# Patient Record
Sex: Male | Born: 1960
Health system: Southern US, Community
[De-identification: ages and names within clinical notes are randomized; demographics above are authoritative.]

## PROBLEM LIST (undated history)

## (undated) DIAGNOSIS — IMO0002 Reserved for concepts with insufficient information to code with codable children: Secondary | ICD-10-CM

## (undated) DIAGNOSIS — H269 Unspecified cataract: Secondary | ICD-10-CM

## (undated) DIAGNOSIS — I10 Essential (primary) hypertension: Secondary | ICD-10-CM

## (undated) DIAGNOSIS — E785 Hyperlipidemia, unspecified: Secondary | ICD-10-CM

## (undated) DIAGNOSIS — N189 Chronic kidney disease, unspecified: Secondary | ICD-10-CM

## (undated) DIAGNOSIS — Q600 Renal agenesis, unilateral: Secondary | ICD-10-CM

## (undated) DIAGNOSIS — I509 Heart failure, unspecified: Secondary | ICD-10-CM

## (undated) DIAGNOSIS — Z5189 Encounter for other specified aftercare: Secondary | ICD-10-CM

## (undated) HISTORY — DX: Chronic kidney disease, unspecified: N18.9

## (undated) HISTORY — DX: Encounter for other specified aftercare: Z51.89

## (undated) HISTORY — DX: Renal agenesis, unilateral: Q60.0

## (undated) HISTORY — PX: KIDNEY SURGERY: SHX687

## (undated) HISTORY — PX: ABDOMINAL SURGERY: SHX537

## (undated) HISTORY — DX: Reserved for concepts with insufficient information to code with codable children: IMO0002

## (undated) HISTORY — DX: Unspecified cataract: H26.9

---

## 2003-07-06 ENCOUNTER — Emergency Department (HOSPITAL_COMMUNITY): Admission: EM | Admit: 2003-07-06 | Discharge: 2003-07-06 | Payer: Self-pay | Admitting: Emergency Medicine

## 2004-03-20 ENCOUNTER — Emergency Department (HOSPITAL_COMMUNITY): Admission: EM | Admit: 2004-03-20 | Discharge: 2004-03-20 | Payer: Self-pay | Admitting: Emergency Medicine

## 2004-03-31 ENCOUNTER — Encounter: Admission: RE | Admit: 2004-03-31 | Discharge: 2004-04-16 | Payer: Self-pay | Admitting: Emergency Medicine

## 2004-04-08 ENCOUNTER — Emergency Department (HOSPITAL_COMMUNITY): Admission: EM | Admit: 2004-04-08 | Discharge: 2004-04-08 | Payer: Self-pay | Admitting: Emergency Medicine

## 2004-04-23 ENCOUNTER — Ambulatory Visit: Payer: Self-pay | Admitting: Internal Medicine

## 2004-05-05 ENCOUNTER — Ambulatory Visit: Payer: Self-pay | Admitting: Internal Medicine

## 2004-05-07 ENCOUNTER — Ambulatory Visit: Payer: Self-pay | Admitting: Internal Medicine

## 2004-05-21 ENCOUNTER — Encounter: Admission: RE | Admit: 2004-05-21 | Discharge: 2004-08-19 | Payer: Self-pay | Admitting: Emergency Medicine

## 2004-05-27 ENCOUNTER — Ambulatory Visit: Payer: Self-pay | Admitting: Internal Medicine

## 2004-07-02 ENCOUNTER — Emergency Department (HOSPITAL_COMMUNITY): Admission: EM | Admit: 2004-07-02 | Discharge: 2004-07-02 | Payer: Self-pay | Admitting: Family Medicine

## 2004-08-08 ENCOUNTER — Ambulatory Visit: Payer: Self-pay | Admitting: Internal Medicine

## 2004-08-21 ENCOUNTER — Encounter: Admission: RE | Admit: 2004-08-21 | Discharge: 2004-11-19 | Payer: Self-pay | Admitting: Emergency Medicine

## 2004-10-17 ENCOUNTER — Encounter (INDEPENDENT_AMBULATORY_CARE_PROVIDER_SITE_OTHER): Payer: Self-pay | Admitting: *Deleted

## 2005-02-17 ENCOUNTER — Ambulatory Visit: Payer: Self-pay | Admitting: Internal Medicine

## 2005-03-26 ENCOUNTER — Ambulatory Visit: Payer: Self-pay | Admitting: Internal Medicine

## 2005-04-23 ENCOUNTER — Ambulatory Visit: Payer: Self-pay | Admitting: Internal Medicine

## 2005-05-19 ENCOUNTER — Encounter (INDEPENDENT_AMBULATORY_CARE_PROVIDER_SITE_OTHER): Payer: Self-pay | Admitting: *Deleted

## 2005-05-19 ENCOUNTER — Ambulatory Visit: Payer: Self-pay | Admitting: Hospitalist

## 2005-08-18 ENCOUNTER — Ambulatory Visit: Payer: Self-pay | Admitting: Internal Medicine

## 2005-09-15 ENCOUNTER — Ambulatory Visit: Payer: Self-pay | Admitting: Internal Medicine

## 2005-10-06 ENCOUNTER — Ambulatory Visit: Payer: Self-pay | Admitting: Internal Medicine

## 2005-11-06 ENCOUNTER — Emergency Department (HOSPITAL_COMMUNITY): Admission: EM | Admit: 2005-11-06 | Discharge: 2005-11-06 | Payer: Self-pay | Admitting: Family Medicine

## 2006-02-17 ENCOUNTER — Encounter (INDEPENDENT_AMBULATORY_CARE_PROVIDER_SITE_OTHER): Payer: Self-pay | Admitting: *Deleted

## 2006-02-17 DIAGNOSIS — E119 Type 2 diabetes mellitus without complications: Secondary | ICD-10-CM

## 2006-02-17 DIAGNOSIS — J301 Allergic rhinitis due to pollen: Secondary | ICD-10-CM | POA: Insufficient documentation

## 2006-02-17 DIAGNOSIS — L910 Hypertrophic scar: Secondary | ICD-10-CM | POA: Insufficient documentation

## 2006-02-17 DIAGNOSIS — E785 Hyperlipidemia, unspecified: Secondary | ICD-10-CM | POA: Insufficient documentation

## 2006-02-17 DIAGNOSIS — K565 Intestinal adhesions [bands], unspecified as to partial versus complete obstruction: Secondary | ICD-10-CM | POA: Insufficient documentation

## 2006-02-17 DIAGNOSIS — Z905 Acquired absence of kidney: Secondary | ICD-10-CM | POA: Insufficient documentation

## 2006-02-17 DIAGNOSIS — E1165 Type 2 diabetes mellitus with hyperglycemia: Secondary | ICD-10-CM

## 2006-02-17 DIAGNOSIS — I10 Essential (primary) hypertension: Secondary | ICD-10-CM | POA: Insufficient documentation

## 2006-02-17 HISTORY — DX: Hyperlipidemia, unspecified: E78.5

## 2006-02-17 HISTORY — DX: Type 2 diabetes mellitus with hyperglycemia: E11.65

## 2006-02-25 ENCOUNTER — Encounter (INDEPENDENT_AMBULATORY_CARE_PROVIDER_SITE_OTHER): Payer: Self-pay | Admitting: Unknown Physician Specialty

## 2006-02-25 ENCOUNTER — Ambulatory Visit: Payer: Self-pay | Admitting: Hospitalist

## 2006-02-25 ENCOUNTER — Encounter (INDEPENDENT_AMBULATORY_CARE_PROVIDER_SITE_OTHER): Payer: Self-pay | Admitting: *Deleted

## 2006-02-25 LAB — CONVERTED CEMR LAB
ALT: 28 units/L (ref 0–53)
AST: 26 units/L (ref 0–37)
Albumin: 4.5 g/dL (ref 3.5–5.2)
Alkaline Phosphatase: 95 units/L (ref 39–117)
BUN: 12 mg/dL (ref 6–23)
CO2: 29 meq/L (ref 19–32)
Calcium: 10.1 mg/dL (ref 8.4–10.5)
Chloride: 97 meq/L (ref 96–112)
Cholesterol: 241 mg/dL — ABNORMAL HIGH (ref 0–200)
Glucose, Bld: 226 mg/dL — ABNORMAL HIGH (ref 70–99)
Hgb A1c MFr Bld: 8.6 %
Potassium: 4.4 meq/L (ref 3.5–5.3)
Sodium: 135 meq/L (ref 135–145)
Total Bilirubin: 1.6 mg/dL — ABNORMAL HIGH (ref 0.3–1.2)
Total Protein: 8.3 g/dL (ref 6.0–8.3)
Triglycerides: 443 mg/dL — ABNORMAL HIGH (ref <150)

## 2006-03-04 ENCOUNTER — Ambulatory Visit: Payer: Self-pay | Admitting: Internal Medicine

## 2006-04-26 ENCOUNTER — Encounter (INDEPENDENT_AMBULATORY_CARE_PROVIDER_SITE_OTHER): Payer: Self-pay | Admitting: *Deleted

## 2006-04-26 LAB — CONVERTED CEMR LAB: Hgb A1c MFr Bld: 8.6 %

## 2006-05-21 ENCOUNTER — Ambulatory Visit: Payer: Self-pay | Admitting: Internal Medicine

## 2006-05-21 DIAGNOSIS — R945 Abnormal results of liver function studies: Secondary | ICD-10-CM

## 2006-05-21 HISTORY — DX: Abnormal results of liver function studies: R94.5

## 2006-05-21 LAB — CONVERTED CEMR LAB
Glucose, Bld: 98 mg/dL
Hgb A1c MFr Bld: 8.4 %

## 2007-04-21 LAB — HM COLONOSCOPY

## 2011-04-04 ENCOUNTER — Emergency Department
Admission: EM | Admit: 2011-04-04 | Discharge: 2011-04-04 | Disposition: A | Discharge status: Home / Self Care | Payer: Self-pay | Source: Home / Self Care

## 2011-04-04 DIAGNOSIS — Z23 Encounter for immunization: Secondary | ICD-10-CM

## 2011-04-04 HISTORY — DX: Hyperlipidemia, unspecified: E78.5

## 2011-04-04 HISTORY — DX: Essential (primary) hypertension: I10

## 2011-05-20 DIAGNOSIS — K621 Rectal polyp: Secondary | ICD-10-CM

## 2011-05-20 DIAGNOSIS — R7989 Other specified abnormal findings of blood chemistry: Secondary | ICD-10-CM | POA: Insufficient documentation

## 2011-05-20 DIAGNOSIS — L989 Disorder of the skin and subcutaneous tissue, unspecified: Secondary | ICD-10-CM | POA: Insufficient documentation

## 2011-05-20 HISTORY — DX: Other specified abnormal findings of blood chemistry: R79.89

## 2011-05-20 HISTORY — DX: Rectal polyp: K62.1

## 2011-05-20 HISTORY — DX: Disorder of the skin and subcutaneous tissue, unspecified: L98.9

## 2016-01-29 ENCOUNTER — Ambulatory Visit (INDEPENDENT_AMBULATORY_CARE_PROVIDER_SITE_OTHER): Payer: 59 | Admitting: Family Medicine

## 2016-01-29 ENCOUNTER — Encounter: Payer: Self-pay | Admitting: Family Medicine

## 2016-01-29 ENCOUNTER — Other Ambulatory Visit: Payer: Self-pay | Admitting: Family Medicine

## 2016-01-29 VITALS — BP 136/90 | HR 71 | Temp 98.1°F | Ht 73.0 in | Wt 204.0 lb

## 2016-01-29 DIAGNOSIS — I10 Essential (primary) hypertension: Secondary | ICD-10-CM | POA: Diagnosis not present

## 2016-01-29 DIAGNOSIS — Z23 Encounter for immunization: Secondary | ICD-10-CM | POA: Diagnosis not present

## 2016-01-29 DIAGNOSIS — IMO0001 Reserved for inherently not codable concepts without codable children: Secondary | ICD-10-CM

## 2016-01-29 DIAGNOSIS — E1165 Type 2 diabetes mellitus with hyperglycemia: Secondary | ICD-10-CM

## 2016-01-29 LAB — HEMOGLOBIN A1C: Hgb A1c MFr Bld: 7 % — ABNORMAL HIGH (ref 4.6–6.5)

## 2016-01-29 MED ORDER — METFORMIN HCL 1000 MG PO TABS: 1000.0000 mg | ORAL_TABLET | Freq: Two times a day (BID) | ORAL | 3 refills | Status: DC

## 2016-01-29 MED ORDER — SITAGLIPTIN PHOSPHATE 100 MG PO TABS: 100.0000 mg | ORAL_TABLET | Freq: Every day | ORAL | 1 refills | Status: DC

## 2016-01-29 MED FILL — metFORMIN HCL 1000 MG TABS: 1000 | 90 days supply | Qty: 180 | Fill #0

## 2016-01-29 MED FILL — JANUVIA 100 MG TABLET: 100 | 30 days supply | Qty: 30 | Fill #0

## 2016-01-29 NOTE — Addendum Note (Signed)
Addended by: Verdie Shire on: 01/29/2016 12:00 PM   Modules accepted: Orders

## 2016-01-29 NOTE — Progress Notes (Signed)
Chief Complaint  Patient presents with  . Establish Care    Pt would like to establish care        New Patient Visit SUBJECTIVE: HPI: Michael Guerra is an 55 y.o.male who is being seen for establishing care.  The patient was previously seen at [facility].  Hypertension Patient presents for hypertension follow up. He does not monitor home blood pressures. He is not on any medications. Patient has these side effects of medication: none He specifically denies headache, visual changes, chest pain, palpitations, dyspnea, orthopnea, PND or peripheral edema. He is adhering to a low sodium and low fat diet. He exercises routinely.  DM Checks sugars 1x/day. Metformin 1000 mg BID, Glucotrol 10 mg nightly. Statin: Yes Not on insulin. Baby ASA? Yes Last eye exam >3 years ago Not on ACEi or ARB due to solitary kidney.  Allergies  Allergen Reactions  . Lisinopril Cough    Past Medical History:  Diagnosis Date  . Diabetes mellitus   . Hyperlipidemia   . Hypertension    Past Surgical History:  Procedure Laterality Date  . ABDOMINAL SURGERY    . KIDNEY SURGERY     Social History   Social History  . Marital status: Married   Social History Main Topics  . Smoking status: Never Smoker  . Smokeless tobacco: Never Used  . Alcohol use No  . Drug use: No   History reviewed. No pertinent family history.   Current Outpatient Prescriptions:  .  aspirin 81 MG tablet, Take 81 mg by mouth daily.  , Disp: , Rfl:  .  glipiZIDE (GLUCOTROL) 10 MG tablet, Take 10 mg by mouth daily., Disp: , Rfl:  .  metFORMIN (GLUCOPHAGE) 1000 MG tablet, Take 1 tablet (1,000 mg total) by mouth 2 (two) times daily with a meal., Disp: 180 tablet, Rfl: 3 .  Multiple Vitamins-Minerals (MULTIVITAMIN ADULT) TABS, Take 1 tablet by mouth daily., Disp: , Rfl:  .  Omega-3 Fatty Acids (FISH OIL) 1000 MG CAPS, Take 1 capsule by mouth daily., Disp: , Rfl:  .  pravastatin (PRAVACHOL) 10 MG tablet, Take 10 mg by mouth  daily. UNSURE OF DOSAGE  , Disp: , Rfl:   ROS Cardiovascular: Denies chest pain  Respiratory: Denies dyspnea   OBJECTIVE: BP 136/90 (BP Location: Left Arm, Patient Position: Sitting, Cuff Size: Large)   Pulse 71   Temp 98.1 F (36.7 C) (Oral)   Ht 6\' 1"  (1.854 m)   Wt 204 lb (92.5 kg)   SpO2 98%   BMI 26.91 kg/m   Constitutional: -  VS reviewed -  Well developed, well nourished, appears stated age -  No apparent distress  Psychiatric: -  Oriented to person, place, and time -  Memory intact -  Affect and mood normal -  Fluent conversation, good eye contact -  Judgment and insight age appropriate  Eye: -  Conjunctivae clear, no discharge -  Pupils symmetric, round, reactive to light  ENMT: -  Oral mucosa without lesions, tongue and uvula midline    Tonsils not enlarged, no erythema, no exudate, trachea midline    Pharynx moist, no lesions, no erythema  Neck: -  No gross swelling, no palpable masses -  Thyroid midline, not enlarged, mobile, no palpable masses  Cardiovascular: -  RRR, no murmurs -  No LE edema  Respiratory: -  Normal respiratory effort, no accessory muscle use, no retraction -  Breath sounds equal, no wheezes, no ronchi, no crackles  Musculoskeletal: -  No clubbing, no cyanosis -  Gait normal  Skin: -  No significant lesion on inspection -  Warm and dry to palpation   ASSESSMENT/PLAN: Uncontrolled type 2 diabetes mellitus without complication, without long-term current use of insulin (HCC) - Plan: Ambulatory referral to Ophthalmology, metFORMIN (GLUCOPHAGE) 1000 MG tablet, HgB A1c  Essential hypertension, benign  Patient instructed to sign release of records form from his previous PCP. Going to stop Glucotrol, if A1c greater than 7, will call in GLP-1 vs DPP4. Did discuss high dose statin with patient, will address at f/u visit. Will update PCV23. HTN- will start with lifestyle modifications. Restarting ARB vs Norvasc at next appointment. ARB may provide  renal protection, but if he is truly against it, will do Norvasc. Patient should return in 1 mo or 3 mo depending on his A1c.  The patient voiced understanding and agreement to the plan.   Jilda Rocheicholas Paul [place], DO 01/29/16  11:18 AM

## 2016-01-29 NOTE — Progress Notes (Signed)
Pre visit review using our clinic review tool, if applicable. No additional management support is needed unless otherwise documented below in the visit note. 

## 2016-01-29 NOTE — Patient Instructions (Addendum)
Check sugars around 3 times weekly or if you feel "off".  Goal BP is <140/90. Try to exercise routinely, and don't neglect cardio!  Finish off the Glipizide and don't worry about refilling it. There are other options that we will consider if needed.

## 2016-03-02 ENCOUNTER — Ambulatory Visit: Payer: 59 | Admitting: Family Medicine

## 2016-03-09 ENCOUNTER — Encounter: Payer: Self-pay | Admitting: Family Medicine

## 2016-03-09 ENCOUNTER — Ambulatory Visit (INDEPENDENT_AMBULATORY_CARE_PROVIDER_SITE_OTHER): Payer: 59 | Admitting: Family Medicine

## 2016-03-09 VITALS — BP 140/88 | HR 84 | Temp 98.1°F | Ht 73.0 in | Wt 208.8 lb

## 2016-03-09 DIAGNOSIS — I10 Essential (primary) hypertension: Secondary | ICD-10-CM | POA: Diagnosis not present

## 2016-03-09 DIAGNOSIS — E785 Hyperlipidemia, unspecified: Secondary | ICD-10-CM | POA: Diagnosis not present

## 2016-03-09 LAB — LIPID PANEL
CHOLESTEROL: 219 mg/dL — AB (ref 0–200)
HDL: 49.7 mg/dL (ref >39.00)
LDL Cholesterol: 143 mg/dL — ABNORMAL HIGH (ref 0–99)
NonHDL: 169.07
Total CHOL/HDL Ratio: 4
Triglycerides: 130 mg/dL (ref 0.0–149.0)
VLDL: 26 mg/dL (ref 0.0–40.0)

## 2016-03-09 LAB — COMPREHENSIVE METABOLIC PANEL
ALK PHOS: 68 U/L (ref 39–117)
ALT: 43 U/L (ref 0–53)
AST: 57 U/L — ABNORMAL HIGH (ref 0–37)
Albumin: 4.5 g/dL (ref 3.5–5.2)
BUN: 14 mg/dL (ref 6–23)
CO2: 31 mEq/L (ref 19–32)
Calcium: 10.1 mg/dL (ref 8.4–10.5)
Chloride: 102 mEq/L (ref 96–112)
Creatinine, Ser: 1.37 mg/dL (ref 0.40–1.50)
GFR: 68.68 mL/min (ref >60.00)
GLUCOSE: 126 mg/dL — AB (ref 70–99)
POTASSIUM: 4.2 meq/L (ref 3.5–5.1)
Sodium: 138 mEq/L (ref 135–145)
Total Bilirubin: 0.8 mg/dL (ref 0.2–1.2)
Total Protein: 8 g/dL (ref 6.0–8.3)

## 2016-03-09 LAB — MICROALBUMIN / CREATININE URINE RATIO
CREATININE, U: 42 mg/dL
Microalb Creat Ratio: 1.7 mg/g (ref 0.0–30.0)
Microalb, Ur: <0.7 mg/dL (ref 0.0–1.9)

## 2016-03-09 NOTE — Progress Notes (Signed)
Chief Complaint  Patient presents with  . Follow-up    on BP    Subjective Michael Guerra is a 55 y.o. male who presents for hypertension follow up. He does monitor home blood pressures. Blood pressures ranging from 130's/80's on average. He is compliant with medications. Patient has these side effects of medication: none He is adhering to a low sodium and low fat diet. Current exercise: Crossfit, Elliptical, weight lifting   DM He was also supposed to be seen for DM II f/u, however had not been taking his Januvia. Glucotrol was stopped and he has only been taking Metformin 1000 mg BID.   Past Medical History:  Diagnosis Date  . Diabetes mellitus   . Hyperlipidemia   . Hypertension    No family history on file.   Medications Current Outpatient Prescriptions on File Prior to Visit  Medication Sig Dispense Refill  . aspirin 81 MG tablet Take 81 mg by mouth daily.      . metFORMIN (GLUCOPHAGE) 1000 MG tablet Take 1 tablet (1,000 mg total) by mouth 2 (two) times daily with a meal. 180 tablet 3  . Multiple Vitamins-Minerals (MULTIVITAMIN ADULT) TABS Take 1 tablet by mouth daily.    . Omega-3 Fatty Acids (FISH OIL) 1000 MG CAPS Take 1 capsule by mouth daily.    . pravastatin (PRAVACHOL) 10 MG tablet Take 10 mg by mouth daily. UNSURE OF DOSAGE      . sitaGLIPtin (JANUVIA) 100 MG tablet Take 1 tablet (100 mg total) by mouth daily. 30 tablet 1   Allergies Allergies  Allergen Reactions  . Lisinopril Cough    Review of Systems Cardiovascular: no chest pain Respiratory:  no shortness of breath  Exam BP 140/88 (BP Location: Left Arm, Patient Position: Sitting, Cuff Size: Large)   Pulse 84   Temp 98.1 F (36.7 C) (Oral)   Ht 6\' 1"  (1.854 m)   Wt 208 lb 12.8 oz (94.7 kg)   SpO2 98%   BMI 27.55 kg/m  General:  well developed, well nourished, in no apparent distress Skin:  warm, no pallor or diaphoresis Eyes:  pupils equal and round, sclera anicteric without  injection Heart: RRR, no murmurs, no LE edema, no murmurs Lungs:  clear to auscultation, breath sounds equal bilaterally Psych: well oriented with normal range of affect and appropriate judgment/insight  Essential hypertension - Plan: Comprehensive metabolic panel, Microalbumin / creatinine urine ratio  Hyperlipidemia, unspecified hyperlipidemia type - Plan: Lipid panel  Start taking Januvia. Keep record of home BP's, will calculate 10 yr CVD to determine BP goals.  F/u in 1 mo. Will start Losartan if he is deemed hypertensive. The patient voiced understanding and agreement to the plan.  [name] [place], DO 03/09/16  12:15 PM

## 2016-03-09 NOTE — Patient Instructions (Signed)
Start the Januvia.

## 2016-03-09 NOTE — Progress Notes (Signed)
Pre visit review using our clinic review tool, if applicable. No additional management support is needed unless otherwise documented below in the visit note. 

## 2016-03-26 DIAGNOSIS — E119 Type 2 diabetes mellitus without complications: Secondary | ICD-10-CM | POA: Diagnosis not present

## 2016-03-26 LAB — HM DIABETES EYE EXAM

## 2016-04-22 DIAGNOSIS — Z3141 Encounter for fertility testing: Secondary | ICD-10-CM | POA: Diagnosis not present

## 2016-06-11 MED FILL — JANUVIA 100 MG TABLET: 100 | 30 days supply | Qty: 30 | Fill #1

## 2016-06-11 MED FILL — metFORMIN HCL 1000 MG TABS: 1000 | 90 days supply | Qty: 180 | Fill #1

## 2016-06-20 ENCOUNTER — Emergency Department (HOSPITAL_BASED_OUTPATIENT_CLINIC_OR_DEPARTMENT_OTHER)
Admission: EM | Admit: 2016-06-20 | Discharge: 2016-06-20 | Disposition: A | Discharge status: Home / Self Care | Payer: 59 | Attending: Emergency Medicine | Admitting: Emergency Medicine

## 2016-06-20 ENCOUNTER — Emergency Department (HOSPITAL_BASED_OUTPATIENT_CLINIC_OR_DEPARTMENT_OTHER): Payer: 59

## 2016-06-20 ENCOUNTER — Encounter (HOSPITAL_BASED_OUTPATIENT_CLINIC_OR_DEPARTMENT_OTHER): Payer: Self-pay | Admitting: Adult Health

## 2016-06-20 DIAGNOSIS — Z79899 Other long term (current) drug therapy: Secondary | ICD-10-CM | POA: Insufficient documentation

## 2016-06-20 DIAGNOSIS — G9001 Carotid sinus syncope: Secondary | ICD-10-CM | POA: Insufficient documentation

## 2016-06-20 DIAGNOSIS — I10 Essential (primary) hypertension: Secondary | ICD-10-CM | POA: Insufficient documentation

## 2016-06-20 DIAGNOSIS — E119 Type 2 diabetes mellitus without complications: Secondary | ICD-10-CM | POA: Insufficient documentation

## 2016-06-20 DIAGNOSIS — Z7984 Long term (current) use of oral hypoglycemic drugs: Secondary | ICD-10-CM | POA: Insufficient documentation

## 2016-06-20 DIAGNOSIS — M542 Cervicalgia: Secondary | ICD-10-CM | POA: Diagnosis not present

## 2016-06-20 DIAGNOSIS — J029 Acute pharyngitis, unspecified: Secondary | ICD-10-CM | POA: Diagnosis present

## 2016-06-20 LAB — BASIC METABOLIC PANEL
Anion gap: 7 (ref 5–15)
BUN: 13 mg/dL (ref 6–20)
CO2: 29 mmol/L (ref 22–32)
Calcium: 9.3 mg/dL (ref 8.9–10.3)
Chloride: 104 mmol/L (ref 101–111)
Creatinine, Ser: 1.47 mg/dL — ABNORMAL HIGH (ref 0.61–1.24)
GFR calc Af Amer: 59 mL/min — ABNORMAL LOW (ref >60)
GFR calc non Af Amer: 51 mL/min — ABNORMAL LOW (ref >60)
Glucose, Bld: 134 mg/dL — ABNORMAL HIGH (ref 65–99)
Potassium: 4.1 mmol/L (ref 3.5–5.1)
Sodium: 140 mmol/L (ref 135–145)

## 2016-06-20 LAB — RAPID STREP SCREEN (MED CTR MEBANE ONLY): Streptococcus, Group A Screen (Direct): NEGATIVE

## 2016-06-20 MED ORDER — IOPAMIDOL (ISOVUE-300) INJECTION 61%
100.0000 mL | Freq: Once | INTRAVENOUS | Status: AC | PRN
  Administered 2016-06-20: 60 mL via INTRAVENOUS

## 2016-06-20 NOTE — ED Triage Notes (Signed)
PREsents with right sided throat pain began Monday. Pain is worse when turning neck or swallowing, denies fever, denies chills. Ibuprofen helps that pain for about 3 hours but then wears off. Able to eat and drink with ibuprofen. lymphnodes not inflamed, airway intact. Denies Dizziness, chest pain and SOB.

## 2016-06-20 NOTE — ED Notes (Signed)
Pt discharged to home with family. NAD.  

## 2016-06-20 NOTE — ED Provider Notes (Signed)
[facility] Provider Note   CSN: [id number] Arrival date & time: 06/20/16  1519   By signing my name below, I, Avnee Patel, attest that this documentation has been prepared under the direction and in the presence of Gwyneth Sprout, MD  Electronically Signed: Clovis Pu, ED Scribe. 06/20/16. 4:52 PM.   History   Chief Complaint Chief Complaint  Patient presents with  . Sore Throat   The history is provided by the patient. No language interpreter was used.   HPI Comments:  Michael Guerra is a 56 y.o. male, with a hx of hyperlipidemia, HTN, DM on metformin and Januvia, who presents to the Emergency Department complaining of acute onset, gradually worsening right sided sore throat which is worse with swallowing onset 4-5 days. He also reports associated right sided neck pain which is worse with movement of his head and palpation. He has been taking ibuprofen, every 4 hours, with temporary relief. He denies vision changes, speech problems, headaches, weakness/numbness, fevers, SOB, dental problem or any other associated symptoms   Past Medical History:  Diagnosis Date  . Diabetes mellitus   . Hyperlipidemia   . Hypertension   . Solitary kidney     Patient Active Problem List   Diagnosis Date Noted  . LIVER FUNCTION TESTS, ABNORMAL 05/21/2006  . DIABETES MELLITUS, TYPE II 02/17/2006  . ALLERGIC RHINITIS, SEASONAL 02/17/2006  . INTESTINAL ADHESIONS WITH OBSTRUCTION 02/17/2006  . KELOID 02/17/2006  . NEPHRECTOMY, HX OF 02/17/2006    Past Surgical History:  Procedure Laterality Date  . ABDOMINAL SURGERY    . KIDNEY SURGERY         Home Medications    Prior to Admission medications   Medication Sig Start Date End Date Taking? Authorizing Provider  aspirin 81 MG tablet Take 81 mg by mouth daily.      Historical Provider, MD  metFORMIN (GLUCOPHAGE) 1000 MG tablet Take 1 tablet (1,000 mg total) by mouth 2 (two) times daily with a meal. 01/29/16   Sharlene Dory, DO  Multiple Vitamins-Minerals (MULTIVITAMIN ADULT) TABS Take 1 tablet by mouth daily.    Historical Provider, MD  Omega-3 Fatty Acids (FISH OIL) 1000 MG CAPS Take 1 capsule by mouth daily.    Historical Provider, MD  pravastatin (PRAVACHOL) 10 MG tablet Take 10 mg by mouth daily. UNSURE OF DOSAGE      Historical Provider, MD  sitaGLIPtin (JANUVIA) 100 MG tablet Take 1 tablet (100 mg total) by mouth daily. 01/29/16   Sharlene Dory, DO    Family History History reviewed. No pertinent family history.  Social History Social History  Substance Use Topics  . Smoking status: Never Smoker  . Smokeless tobacco: Never Used  . Alcohol use No     Allergies   Lisinopril   Review of Systems Review of Systems  Constitutional: Negative for fever.  HENT: Positive for sore throat. Negative for dental problem.   Respiratory: Negative for shortness of breath.   Musculoskeletal: Positive for neck pain.  All other systems reviewed and are negative.    Physical Exam Updated Vital Signs BP 161/96 (BP Location: Left Arm)   Pulse 77   Temp 98.2 F (36.8 C) (Oral)   Resp 18   Ht 6\' 1"  (1.854 m)   Wt 205 lb (93 kg)   SpO2 100%   BMI 27.05 kg/m   Physical Exam  Constitutional: He is oriented to person, place, and time. He appears well-developed and well-nourished. No distress.  HENT:  Head: Normocephalic and atraumatic.  Mouth/Throat: Uvula is midline and oropharynx is clear and moist. No oropharyngeal exudate, posterior oropharyngeal edema or posterior oropharyngeal erythema.  No hoarse voice. Dentition is normal without tenderness and swelling   Eyes: Conjunctivae are normal.  Neck: Normal range of motion.  Tenderness along anterior portion of the right sternocleidomastoid. No palpable masses noted. No palpable masses. His tenderness is reproduced with turning his head to the right.   Cardiovascular: Normal rate.   Pulmonary/Chest: Effort normal.  Abdominal: He  exhibits no distension.  Neurological: He is alert and oriented to person, place, and time.  Skin: Skin is warm and dry.  Psychiatric: He has a normal mood and affect.  Nursing note and vitals reviewed.   ED Treatments / Results  DIAGNOSTIC STUDIES:  Oxygen Saturation is 100% on RA, normal by my interpretation.    COORDINATION OF CARE:  4:42 PM Discussed treatment plan with pt at bedside and pt agreed to plan.  Labs (all labs ordered are listed, but only abnormal results are displayed) Labs Reviewed  BASIC METABOLIC PANEL - Abnormal; Notable for the following:       Result Value   Glucose, Bld 134 (*)    Creatinine, Ser 1.47 (*)    GFR calc non Af Amer 51 (*)    GFR calc Af Amer 59 (*)    All other components within normal limits  RAPID STREP SCREEN (NOT AT [facility])  CULTURE, GROUP A STREP [facility])    EKG  EKG Interpretation None       Radiology Ct Soft Tissue Neck W Contrast  Result Date: 06/20/2016 CLINICAL DATA:  Right neck pain EXAM: CT NECK WITH CONTRAST TECHNIQUE: Multidetector CT imaging of the neck was performed using the standard protocol following the bolus administration of intravenous contrast. CONTRAST:  60mL ISOVUE-300 IOPAMIDOL (ISOVUE-300) INJECTION 61% COMPARISON:  None. FINDINGS: Pharynx and larynx: Normal. No mass or swelling. Salivary glands: No inflammation, mass, or stone. Thyroid: Normal. Lymph nodes: None enlarged or abnormal density. Vascular: There is fat inflammation centered on the right carotid bifurcation and proximal internal and external carotid branches. Prominent low-density plaque and mural enhancement at the level of the ICA bulb. No definite narrowing although limited visualization at the level of dental amalgam and stylohyoid ligament due to artifact. Degree of inflammatory changes would be unusual for standard dissection; no signs of aneurysm. Minimal atherosclerotic calcification of the left common carotid bifurcation. No inflammatory findings  along the arch or proximal great vessels. Limited intracranial: Negative Visualized orbits: Negative Mastoids and visualized paranasal sinuses: Negative Skeleton: No acute finding.  C5-6 predominant disc degeneration. Upper chest: 7 mm average diameter pulmonary nodule in the right upper lobe. Other: Results were called by telephone at the time of interpretation on 06/20/2016 at 6:45 pm to Dr. Gwyneth Sprout , who verbally acknowledged these results. IMPRESSION: 1. Focal inflammation along the right cervical carotid bifurcation as seen with carotidynia. Focal dissection is a differential consideration but usually not associated with this degree of inflammation; would obtain CTA at follow-up. Large vessel vasculitis is considered less given the acute symptomatology and lack of inflammatory changes along the arch or proximal great vessels. 2. 7 mm pulmonary nodule in the right upper lobe. Non-contrast chest CT at 6-12 months is recommended. If the nodule is stable at time of repeat CT, then future CT at 18-24 months (from today's scan) is considered optional for low-risk patients, but is recommended for high-risk patients. This recommendation follows the  consensus statement: Guidelines for Management of Incidental Pulmonary Nodules Detected on CT Images: From the Fleischner Society 2017; Radiology 2017; 284:228-243. Electronically Signed   By: Marnee Spring M.D.   On: 06/20/2016 18:54    Procedures Procedures (including critical care time)  Medications Ordered in ED Medications - No data to display   Initial Impression / Assessment and Plan / ED Course  I have reviewed the triage vital signs and the nursing notes.  Pertinent labs & imaging results that were available during my care of the patient were reviewed by me and considered in my medical decision making (see chart for details).     Patient presenting today with anterior right-sided neck pain which has worsened over the last 5 days with  associated pain with swallowing. He has no neurological complaints at this time. He denies any infectious complaints. There is no notable external swelling or mouth pathology. Will do a CT to rule out soft tissue mass, underlying infection given patient is a diabetic. CT shows focal inflammation around the right cervical carotid bifurcation consistent with carotidynia.  Spoke with neurology, ENT and vascular surgery who all did not have specific recommendation. Radiology recommended a CTA at follow-up to rule out development dissection or pseudoaneurysm. Patient was instructed to continue taking his 81 mg aspirin. He will follow-up with his PCP. He was also told about the pulmonary nodule that he will need follow-up in 6-12 months. However he is at low risk as he is a nonsmoker and has no risk factors.  Final Clinical Impressions(s) / ED Diagnoses   Final diagnoses:  Carotidynia    New Prescriptions Discharge Medication List as of 06/20/2016  8:52 PM     I personally performed the services described in this documentation, which was scribed in my presence.  The recorded information has been reviewed and considered.     Gwyneth Sprout, MD 06/21/16 [phone]

## 2016-06-23 DIAGNOSIS — N500 Atrophy of testis: Secondary | ICD-10-CM | POA: Diagnosis not present

## 2016-06-23 DIAGNOSIS — Z905 Acquired absence of kidney: Secondary | ICD-10-CM | POA: Diagnosis not present

## 2016-06-23 DIAGNOSIS — N4601 Organic azoospermia: Secondary | ICD-10-CM | POA: Diagnosis not present

## 2016-06-23 DIAGNOSIS — N5314 Retrograde ejaculation: Secondary | ICD-10-CM | POA: Diagnosis not present

## 2016-06-23 LAB — CULTURE, GROUP A STREP (THRC)

## 2016-06-26 ENCOUNTER — Ambulatory Visit (INDEPENDENT_AMBULATORY_CARE_PROVIDER_SITE_OTHER): Payer: 59 | Admitting: Family Medicine

## 2016-06-26 ENCOUNTER — Encounter: Payer: Self-pay | Admitting: Family Medicine

## 2016-06-26 VITALS — BP 120/76 | HR 76 | Temp 97.7°F | Ht 73.0 in | Wt 216.4 lb

## 2016-06-26 DIAGNOSIS — R911 Solitary pulmonary nodule: Secondary | ICD-10-CM | POA: Diagnosis not present

## 2016-06-26 DIAGNOSIS — G9001 Carotid sinus syncope: Secondary | ICD-10-CM | POA: Diagnosis not present

## 2016-06-26 DIAGNOSIS — E119 Type 2 diabetes mellitus without complications: Secondary | ICD-10-CM | POA: Diagnosis not present

## 2016-06-26 DIAGNOSIS — N500 Atrophy of testis: Secondary | ICD-10-CM | POA: Diagnosis not present

## 2016-06-26 LAB — COMPREHENSIVE METABOLIC PANEL
ALT: 33 U/L (ref 0–53)
AST: 36 U/L (ref 0–37)
Albumin: 4 g/dL (ref 3.5–5.2)
Alkaline Phosphatase: 75 U/L (ref 39–117)
BUN: 15 mg/dL (ref 6–23)
CO2: 28 mEq/L (ref 19–32)
Calcium: 9.7 mg/dL (ref 8.4–10.5)
Chloride: 107 mEq/L (ref 96–112)
Creatinine, Ser: 1.58 mg/dL — ABNORMAL HIGH (ref 0.40–1.50)
GFR: 58.2 mL/min — ABNORMAL LOW (ref >60.00)
Glucose, Bld: 97 mg/dL (ref 70–99)
Potassium: 4.1 mEq/L (ref 3.5–5.1)
Sodium: 140 mEq/L (ref 135–145)
Total Bilirubin: 0.7 mg/dL (ref 0.2–1.2)
Total Protein: 7.1 g/dL (ref 6.0–8.3)

## 2016-06-26 LAB — LIPID PANEL
Cholesterol: 192 mg/dL (ref 0–200)
HDL: 40.1 mg/dL (ref >39.00)
NonHDL: 151.86
Total CHOL/HDL Ratio: 5
Triglycerides: 354 mg/dL — ABNORMAL HIGH (ref 0.0–149.0)
VLDL: 70.8 mg/dL — ABNORMAL HIGH (ref 0.0–40.0)

## 2016-06-26 LAB — LDL CHOLESTEROL, DIRECT: Direct LDL: 98 mg/dL

## 2016-06-26 LAB — CBC
HCT: 38.4 % — ABNORMAL LOW (ref 39.0–52.0)
HEMOGLOBIN: 13.1 g/dL (ref 13.0–17.0)
MCHC: 34.3 g/dL (ref 30.0–36.0)
MCV: 87.5 fl (ref 78.0–100.0)
Platelets: 248 10*3/uL (ref 150.0–400.0)
RBC: 4.39 Mil/uL (ref 4.22–5.81)
RDW: 13.8 % (ref 11.5–15.5)
WBC: 3.4 10*3/uL — ABNORMAL LOW (ref 4.0–10.5)

## 2016-06-26 LAB — HEMOGLOBIN A1C: HEMOGLOBIN A1C: 7.2 % — AB (ref 4.6–6.5)

## 2016-06-26 MED ORDER — PRAVASTATIN SODIUM 40 MG PO TABS
40.0000 mg | ORAL_TABLET | Freq: Every day | ORAL | 3 refills | Status: DC
Start: 2016-06-26 — End: 2018-02-16

## 2016-06-26 MED FILL — PRAVASTATIN NA 40 MG TAB: 40 | 90 days supply | Qty: 90 | Fill #0

## 2016-06-26 NOTE — Progress Notes (Signed)
Pre visit review using our clinic review tool, if applicable. No additional management support is needed unless otherwise documented below in the visit note. 

## 2016-06-26 NOTE — Progress Notes (Signed)
Chief Complaint  Patient presents with  . Follow-up    for pain and swelling on the (R) side of neck    Subjective: Patient is a 56 y.o. male here for ED f/u.  The patient was seen in the emergency department for neck pain on 06/20/16. He was diagnosed with carotodynia after CT scan. An incidental nodule was also found. Follow-up CTA was recommended by the radiologist.  Currently, the patient reports he is doing much better. He is not taking ibuprofen as frequently. This initially started when he woke up with his neck in a compromised position of the patella. He's been taking aspirin daily since being seen in the emergency department. He does not have any shortness of breath, fevers, or trouble swallowing. He was told to have his kidney function checked as did receive contrast for the procedure done in the emergency department. He does have only one kidney.  ROS: Heart: Denies chest pain  Lungs: Denies SOB  MSK: +neck pain  No family history on file. Past Medical History:  Diagnosis Date  . Diabetes mellitus   . Hyperlipidemia   . Hypertension   . Solitary kidney    Allergies  Allergen Reactions  . Lisinopril Cough     Current Outpatient Prescriptions:  .  aspirin 81 MG tablet, Take 81 mg by mouth daily.  , Disp: , Rfl:  .  metFORMIN (GLUCOPHAGE) 1000 MG tablet, Take 1 tablet (1,000 mg total) by mouth 2 (two) times daily with a meal., Disp: 180 tablet, Rfl: 3 .  Multiple Vitamins-Minerals (MULTIVITAMIN ADULT) TABS, Take 1 tablet by mouth daily., Disp: , Rfl:  .  Omega-3 Fatty Acids (FISH OIL) 1000 MG CAPS, Take 1 capsule by mouth daily., Disp: , Rfl:  .  sitaGLIPtin (JANUVIA) 100 MG tablet, Take 1 tablet (100 mg total) by mouth daily., Disp: 30 tablet, Rfl: 1 .  pravastatin (PRAVACHOL) 40 MG tablet, Take 1 tablet (40 mg total) by mouth daily., Disp: 90 tablet, Rfl: 3  Objective: BP 120/76 (BP Location: Left Arm, Patient Position: Sitting, Cuff Size: Large)   Pulse 76   Temp  97.7 F (36.5 C) (Oral)   Ht 6\' 1"  (1.854 m)   Wt 216 lb 6.4 oz (98.2 kg)   SpO2 98%   BMI 28.55 kg/m  General: Awake, appears stated age HEENT: MMM, EOMi Heart: RRR, no murmurs, no bruits Lungs: CTAB, no rales, wheezes or rhonchi. No accessory muscle use MSK: +TTP of R SCM mid 1/3, nml ROM of neck Psych: Age appropriate judgment and insight, normal affect and mood  Assessment and Plan: Carotodynia - Plan: CT ANGIO NECK W OR WO CONTRAST, Comprehensive metabolic panel  Diabetes mellitus type 2 in nonobese (HCC) - Plan: pravastatin (PRAVACHOL) 40 MG tablet, CBC, Comprehensive metabolic panel, Hemoglobin A1c, Lipid panel  Orders as above. Will f/u on CTA. Will place recall letter for CT chest f/u. He is low risk so will present option pending result of that future scan. BP well controlled today. We'll hold off on medication. Would recommend continuing to check blood pressure moving forward. We'll check diabetes labs. Follow-up pending the results. The patient voiced understanding and agreement to the plan.  [name] [place], DO 06/26/16  11:39 AM

## 2016-06-26 NOTE — Patient Instructions (Addendum)
Around 3 times per week, check your blood pressure 4 times per day. Twice in the morning and twice in the evening. The readings should be at least one minute apart. Write down these values and bring them to your next nurse visit/appointment.  When you check your BP, make sure you have been doing something calm/relaxing 5 minutes prior to checking. Both feet should be flat on the floor and you should be sitting. Use your left arm and make sure it is in a relaxed position (on a table), and that the cuff is at the approximate level/height of your heart.  We can discuss your readings at your diabetes appointment. Will schedule based off of your labs.

## 2016-06-29 DIAGNOSIS — Z3141 Encounter for fertility testing: Secondary | ICD-10-CM | POA: Diagnosis not present

## 2016-07-01 DIAGNOSIS — N500 Atrophy of testis: Secondary | ICD-10-CM | POA: Diagnosis not present

## 2016-07-09 ENCOUNTER — Ambulatory Visit (HOSPITAL_BASED_OUTPATIENT_CLINIC_OR_DEPARTMENT_OTHER)
Admission: RE | Admit: 2016-07-09 | Discharge: 2016-07-09 | Disposition: A | Discharge status: Home / Self Care | Payer: 59 | Source: Ambulatory Visit | Attending: Family Medicine | Admitting: Family Medicine

## 2016-07-09 ENCOUNTER — Encounter (HOSPITAL_BASED_OUTPATIENT_CLINIC_OR_DEPARTMENT_OTHER): Payer: Self-pay

## 2016-07-09 DIAGNOSIS — M542 Cervicalgia: Secondary | ICD-10-CM | POA: Diagnosis not present

## 2016-07-09 DIAGNOSIS — G9001 Carotid sinus syncope: Secondary | ICD-10-CM | POA: Insufficient documentation

## 2016-07-09 MED ORDER — IOPAMIDOL (ISOVUE-370) INJECTION 76%
100.0000 mL | Freq: Once | INTRAVENOUS | Status: AC | PRN
  Administered 2016-07-09: 80 mL via INTRAVENOUS

## 2016-07-27 ENCOUNTER — Ambulatory Visit (INDEPENDENT_AMBULATORY_CARE_PROVIDER_SITE_OTHER): Payer: 59 | Admitting: Family Medicine

## 2016-07-27 ENCOUNTER — Encounter: Payer: Self-pay | Admitting: Family Medicine

## 2016-07-27 VITALS — BP 126/84 | HR 71 | Temp 98.2°F | Ht 73.0 in | Wt 211.6 lb

## 2016-07-27 DIAGNOSIS — E119 Type 2 diabetes mellitus without complications: Secondary | ICD-10-CM | POA: Diagnosis not present

## 2016-07-27 DIAGNOSIS — R030 Elevated blood-pressure reading, without diagnosis of hypertension: Secondary | ICD-10-CM

## 2016-07-27 MED ORDER — SITAGLIPTIN PHOSPHATE 100 MG PO TABS: 100.0000 mg | ORAL_TABLET | Freq: Every day | ORAL | 5 refills | Status: DC

## 2016-07-27 MED FILL — JANUVIA 100 MG TABLET: 100 | 30 days supply | Qty: 30 | Fill #0

## 2016-07-27 NOTE — Progress Notes (Signed)
Subjective:   Chief Complaint  Patient presents with  . Follow-up    1 mos on DM    Michael Guerra is a 56 y.o. male here for follow-up of diabetes.   Benn's self monitored glucose range is 150's.  Patient denies hypoglycemic reactions. He checks his glucose levels 1 times per day. Patient does not require insulin.   Medications include: Metformin 1000 mg BID, Januvia 100 mg daily Patient exercises walking/running at work.  He does take an aspirin daily. Statin? Yes ACEi/ARB? No- BP had been well controlled  He does have a BP cuff at home, but has not been checking his BP routinely lately.   Past Medical History:  Diagnosis Date  . Diabetes mellitus   . Hyperlipidemia   . Hypertension   . Solitary kidney     Past Surgical History:  Procedure Laterality Date  . ABDOMINAL SURGERY    . KIDNEY SURGERY      Social History   Social History  . Marital status: Married   Social History Main Topics  . Smoking status: Never Smoker  . Smokeless tobacco: Never Used  . Alcohol use No  . Drug use: No   Current Outpatient Prescriptions on File Prior to Visit  Medication Sig Dispense Refill  . aspirin 81 MG tablet Take 81 mg by mouth daily.      . metFORMIN (GLUCOPHAGE) 1000 MG tablet Take 1 tablet (1,000 mg total) by mouth 2 (two) times daily with a meal. 180 tablet 3  . Multiple Vitamins-Minerals (MULTIVITAMIN ADULT) TABS Take 1 tablet by mouth daily.    . Omega-3 Fatty Acids (FISH OIL) 1000 MG CAPS Take 1 capsule by mouth daily.    . pravastatin (PRAVACHOL) 40 MG tablet Take 1 tablet (40 mg total) by mouth daily. 90 tablet 3  . sitaGLIPtin (JANUVIA) 100 MG tablet Take 1 tablet (100 mg total) by mouth daily. 30 tablet 1    Related testing: Foot exam(monofilament and inspection):done Retinal exam:done Date of retinal exam: 03/26/17  Done by:  Dr. Cathey Endow Pneumovax: done Flu Shot: done  Review of Systems: Eye:  No recent significant change in vision Pulmonary:  No  SOB Cardiovascular:  No chest pain, no palpitations Skin/Integumentary ROS:  No abnormal skin lesions reported Neurologic:  No numbness, tingling  Objective:  BP 126/84 (BP Location: Left Arm, Patient Position: Sitting, Cuff Size: Large)   Pulse 71   Temp 98.2 F (36.8 C) (Oral)   Ht 6\' 1"  (1.854 m)   Wt 211 lb 9.6 oz (96 kg)   SpO2 97%   BMI 27.92 kg/m  General:  Well developed, well nourished, in no apparent distress Skin:  Warm, no pallor or diaphoresis Head:  Normocephalic, atraumatic Eyes:  Pupils equal and round, sclera anicteric without injection  Neck: Neck supple.  No obvious thyromegaly or masses.  No bruits Lungs:  clear to auscultation, breath sounds equal bilaterally, no wheezes, rales, or stridor Cardio:  regular rate and rhythm without murmurs, no bruits, no LE edema Musculoskeletal:  Symmetrical muscle groups noted without atrophy or deformity Neuro:  Sensation intact to pinprick on feet Psych: Age appropriate judgment and insight  Assessment:   Diabetes mellitus type 2 in nonobese (HCC) - Plan: sitaGLIPtin (JANUVIA) 100 MG tablet   Plan:  Status: uncontrolled Orders as above. Refill Januvia, cont Metformin. Discussed D/C'ing Januvia and changing to a GLP1 but the pt does not want to inject himself if possible. Also discussed adding another agent, however given  his lifestyle changes, will hold off.  He has lost 5 lbs and is making changes for better, goal weight of 185 lbs set by patient. Goal of 5 lbs by next visit in 2 mo. He is not at goal with his BP either. Instructed to check home readings and bring them to next appt. If uncontrolled, will start Losartan, which may help preserve renal function in his solitary kidney state. F/u in 2 mo. If A1c is still 7 or greater, will discuss starting GLP1 vs adding SGLT2. The patient voiced understanding and agreement to the plan.  [name] [place], DO 07/27/16 9:23 AM

## 2016-07-27 NOTE — Progress Notes (Signed)
Pre visit review using our clinic review tool, if applicable. No additional management support is needed unless otherwise documented below in the visit note. 

## 2016-07-27 NOTE — Patient Instructions (Addendum)
Keep up the good work with your weight loss! Goal weight loss in another 5 lbs by the next time I see you.  Start checking your blood pressure 2-3 times per week. Write down numbers and bring to visit next. The goal blood pressure is 130/79 or less.   Call if you need anything.

## 2016-08-12 DIAGNOSIS — N4601 Organic azoospermia: Secondary | ICD-10-CM | POA: Diagnosis not present

## 2016-08-12 MED FILL — ANASTROZOLE 1 MG TABLET: 1 | 30 days supply | Qty: 60 | Fill #0

## 2016-08-12 MED FILL — CLOMIPHENE CITRATE 50 MG TA: 50 | 30 days supply | Qty: 30 | Fill #0

## 2016-09-07 MED FILL — JANUVIA 100 MG TABLET: 100 | 30 days supply | Qty: 30 | Fill #1

## 2016-09-10 MED FILL — CLOMIPHENE CITRATE 50 MG TA: 50 | 30 days supply | Qty: 30 | Fill #1

## 2016-09-10 MED FILL — ANASTROZOLE 1 MG TABLET: 1 | 30 days supply | Qty: 60 | Fill #1

## 2016-09-21 ENCOUNTER — Encounter: Payer: Self-pay | Admitting: Family Medicine

## 2016-09-21 ENCOUNTER — Ambulatory Visit (INDEPENDENT_AMBULATORY_CARE_PROVIDER_SITE_OTHER): Payer: 59 | Admitting: Family Medicine

## 2016-09-21 VITALS — BP 120/80 | HR 76 | Temp 98.3°F | Ht 73.0 in | Wt 215.2 lb

## 2016-09-21 DIAGNOSIS — G8929 Other chronic pain: Secondary | ICD-10-CM | POA: Diagnosis not present

## 2016-09-21 DIAGNOSIS — M25512 Pain in left shoulder: Secondary | ICD-10-CM | POA: Diagnosis not present

## 2016-09-21 DIAGNOSIS — E119 Type 2 diabetes mellitus without complications: Secondary | ICD-10-CM | POA: Diagnosis not present

## 2016-09-21 LAB — LIPID PANEL
CHOL/HDL RATIO: 4
Cholesterol: 142 mg/dL (ref 0–200)
HDL: 36.5 mg/dL — ABNORMAL LOW (ref >39.00)
LDL Cholesterol: 66 mg/dL (ref 0–99)
NonHDL: 105.45
Triglycerides: 198 mg/dL — ABNORMAL HIGH (ref 0.0–149.0)
VLDL: 39.6 mg/dL (ref 0.0–40.0)

## 2016-09-21 LAB — BASIC METABOLIC PANEL
BUN: 18 mg/dL (ref 6–23)
CHLORIDE: 105 meq/L (ref 96–112)
CO2: 29 mEq/L (ref 19–32)
Calcium: 9.6 mg/dL (ref 8.4–10.5)
Creatinine, Ser: 1.5 mg/dL (ref 0.40–1.50)
GFR: 61.74 mL/min (ref >60.00)
Glucose, Bld: 174 mg/dL — ABNORMAL HIGH (ref 70–99)
POTASSIUM: 4.6 meq/L (ref 3.5–5.1)
Sodium: 139 mEq/L (ref 135–145)

## 2016-09-21 LAB — HEMOGLOBIN A1C: HEMOGLOBIN A1C: 7.5 % — AB (ref 4.6–6.5)

## 2016-09-21 NOTE — Progress Notes (Signed)
Subjective:   Chief Complaint  Patient presents with  . Follow-up    2  mos on DM    Michael Guerra is a 56 y.o. male here for follow-up of diabetes.   Kashif's self monitored glucose range is mid 100's.  Patient denies hypoglycemic reactions. He checks his glucose levels rarely. Patient does not require insulin.   Medications include: Metformin 1000 mg BID, Januvia 100 mg daily He does take an aspirin daily. Statin? Yes ACEi/ARB? No- BP controlled w lifestyle changes and no significant proteinuria   The patient also brought up L shoulder pain. This has been going on for 3 months and there was no change in activity or injury known. It is sharp/throbbing, no radiation. He has not tried anything at home so far. He has decreased range of motion. He denies any swelling, redness, fevers, numbness, tingling, or weakness. He has never had any issues with this shoulder before. 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 It does not wake him up when he rolls over on his left shoulder, his main shoulder that he sleeps on.  Past Medical History:  Diagnosis Date  . Diabetes mellitus   . Hyperlipidemia   . Hypertension   . Solitary kidney     Past Surgical History:  Procedure Laterality Date  . ABDOMINAL SURGERY    . KIDNEY SURGERY      Social History   Social History  . Marital status: Married   Social History Main Topics  . Smoking status: Never Smoker  . Smokeless tobacco: Never Used  . Alcohol use No  . Drug use: No   Current Outpatient Prescriptions on File Prior to Visit  Medication Sig Dispense Refill  . aspirin 81 MG tablet Take 81 mg by mouth daily.      . metFORMIN (GLUCOPHAGE) 1000 MG tablet Take 1 tablet (1,000 mg total) by mouth 2 (two) times daily with a meal. 180 tablet 3  . Multiple Vitamins-Minerals (MULTIVITAMIN ADULT) TABS Take 1 tablet by mouth daily.    . Omega-3 Fatty Acids (FISH OIL) 1000 MG CAPS  Take 1 capsule by mouth daily.    . pravastatin (PRAVACHOL) 40 MG tablet Take 1 tablet (40 mg total) by mouth daily. 90 tablet 3  . sitaGLIPtin (JANUVIA) 100 MG tablet Take 1 tablet (100 mg total) by mouth daily. 30 tablet 5   Related testing: Date of retinal exam: 03/26/2017  Done by:  Dr. Cathey Endow Pneumovax: done Flu Shot: done  Review of Systems: Eye:  No recent significant change in vision Pulmonary:  No SOB Cardiovascular:  No chest pain, no palpitations  Objective:  BP 120/80 (BP Location: Left Arm, Patient Position: Sitting, Cuff Size: Large)   Pulse 76   Temp 98.3 F (36.8 C) (Oral)   Ht 6\' 1"  (1.854 m)   Wt 215 lb 3.2 oz (97.6 kg)   SpO2 96%   BMI 28.39 kg/m  General:  Well developed, well nourished, in no apparent distress Skin:  Warm, no pallor or diaphoresis Head:  Normocephalic, atraumatic Eyes:  Pupils equal and round, sclera anicteric without injection  Nose:  External nares without trauma, no discharge Throat/Pharynx:  Lips and gingiva without lesion Neck: Neck supple.  No obvious thyromegaly or masses.  No bruits Lungs:  clear to auscultation, breath sounds equal bilaterally, no wheezes, rales, or stridor Cardio:  regular rate and rhythm without murmurs, no bruits,  no LE edema Abdomen:  Abdomen soft, non-tender, BS normal Musculoskeletal:  Decreased abduction of the left shoulder, decreased internal rotation, no tenderness to palpation, positive Neer's, empty can, Leanord Asal; negative crossover, speeds, liftoff, O'Briens Neuro:  2/4 biceps reflex b/l wo clonus Psych: Age appropriate judgment and insight  Assessment:   Diabetes mellitus type 2 in nonobese (HCC) - Plan: Hemoglobin A1c, Lipid panel, Basic metabolic panel  Chronic left shoulder pain - Plan: Ambulatory referral to Physical Therapy   Plan:   Orders as above. Counseled on diet and exercise. No significant changes to his medicine regimen. Blood pressure is appropriate today. I do not believe  there is a full-thickness tear given his strength. His history is not concerning for impingement, though certain portions of the exam are. Discussed getting an x-ray to evaluate for this versus trialing home stretches/exercises along with physical therapy. Tylenol, heat, home stretches/exercises given. F/u in 3 mo for DM, 1 mo for shoulder. The patient voiced understanding and agreement to the plan.  [name] [place], DO 09/21/16 1:10 PM

## 2016-09-21 NOTE — Patient Instructions (Signed)
OK to take Tylenol 1000 mg (2 extra strength tabs) or 975 mg (3 regular strength tabs) every 6 hours as needed.  Heat (pad or rice pillow in microwave) over affected area, 10-15 minutes every 2-3 hours while awake.   EXERCISES  RANGE OF MOTION (ROM) AND STRETCHING EXERCISES These exercises may help you when beginning to rehabilitate your injury. Your symptoms may resolve with or without further involvement from your physician, physical therapist or athletic trainer. While completing these exercises, remember:   Restoring tissue flexibility helps normal motion to return to the joints. This allows healthier, less painful movement and activity.  An effective stretch should be held for at least 30 seconds.  A stretch should never be painful. You should only feel a gentle lengthening or release in the stretched tissue.  ROM - Pendulum  Bend at the waist so that your right / left arm falls away from your body. Support yourself with your opposite hand on a solid surface, such as a table or a countertop.  Your right / left arm should be perpendicular to the ground. If it is not perpendicular, you need to lean over farther. Relax the muscles in your right / left arm and shoulder as much as possible.  Gently sway your hips and trunk so they move your right / left arm without any use of your right / left shoulder muscles.  Progress your movements so that your right / left arm moves side to side, then forward and backward, and finally, both clockwise and counterclockwise.  Complete 10-15 repetitions in each direction. Many people use this exercise to relieve discomfort in their shoulder as well as to gain range of motion. Repeat 2-3 times. Complete this exercise once per day.  STRETCH - Flexion, Standing  Stand with good posture. With an underhand grip on your right / left hand and an overhand grip on the opposite hand, grasp a broomstick or cane so that your hands are a little more than  shoulder-width apart.  Keeping your right / left elbow straight and shoulder muscles relaxed, push the stick with your opposite hand to raise your right / left arm in front of your body and then overhead. Raise your arm until you feel a stretch in your right / left shoulder, but before you have increased shoulder pain.  Try to avoid shrugging your right / left shoulder as your arm rises by keeping your shoulder blade tucked down and toward your mid-back spine. Hold 15-20 seconds.  Slowly return to the starting position. Repeat 2-3 times. Complete this exercise once per day.  STRETCH - Internal Rotation  Place your right / left hand behind your back, palm-up.  Throw a towel or belt over your opposite shoulder. Grasp the towel/belt with your right / left hand.  While keeping an upright posture, gently pull up on the towel/belt until you feel a stretch in the front of your right / left shoulder.  Avoid shrugging your right / left shoulder as your arm rises by keeping your shoulder blade tucked down and toward your mid-back spine.  Hold 15-20. Release the stretch by lowering your opposite hand. Repeat 2-3 times. Complete this exercise once per day.  STRETCH - External Rotation and Abduction  Stagger your stance through a doorframe. It does not matter which foot is forward.  As instructed by your physician, physical therapist or athletic trainer, place your hands: ? And forearms above your head and on the door frame. ? And forearms at head-height  and on the door frame. ? At elbow-height and on the door frame.  Keeping your head and chest upright and your stomach muscles tight to prevent over-extending your low-back, slowly shift your weight onto your front foot until you feel a stretch across your chest and/or in the front of your shoulders.  Hold 15-20 seconds. Shift your weight to your back foot to release the stretch. Repeat 2-3 times. Complete this stretch once per day.    STRENGTHENING EXERCISES  These exercises may help you when beginning to rehabilitate your injury. They may resolve your symptoms with or without further involvement from your physician, physical therapist or athletic trainer. While completing these exercises, remember:   Muscles can gain both the endurance and the strength needed for everyday activities through controlled exercises.  Complete these exercises as instructed by your physician, physical therapist or athletic trainer. Progress the resistance and repetitions only as guided.  You may experience muscle soreness or fatigue, but the pain or discomfort you are trying to eliminate should never worsen during these exercises. If this pain does worsen, stop and make certain you are following the directions exactly. If the pain is still present after adjustments, discontinue the exercise until you can discuss the trouble with your clinician.  If advised by your physician, during your recovery, avoid activity or exercises which involve actions that place your right / left hand or elbow above your head or behind your back or head. These positions stress the tissues which are trying to heal.  STRENGTH - Scapular Depression and Adduction  With good posture, sit on a firm chair. Supported your arms in front of you with pillows, arm rests or a table top. Have your elbows in line with the sides of your body.  Gently draw your shoulder blades down and toward your mid-back spine. Gradually increase the tension without tensing the muscles along the top of your shoulders and the back of your neck.  Hold for 5 seconds. Slowly release the tension and relax your muscles completely before completing the next repetition.  After you have practiced this exercise, remove the arm support and complete it in standing as well as sitting. Repeat 2 times. Complete this exercise every other day.   STRENGTH - External Rotators  Secure a rubber exercise band/tubing  to a fixed object so that it is at the same height as your right / left elbow when you are standing or sitting on a firm surface.  Stand or sit so that the secured exercise band/tubing is at your side that is not injured.  Bend your elbow 90 degrees. Place a folded towel or small pillow under your right / left arm so that your elbow is a few inches away from your side.  Keeping the tension on the exercise band/tubing, pull it away from your body, as if pivoting on your elbow. Be sure to keep your body steady so that the movement is only coming from your shoulder rotating.  Hold 5 seconds. Release the tension in a controlled manner as you return to the starting position. Repeat 2 times. Complete this exercise every other day.   STRENGTH - Supraspinatus  Stand or sit with good posture. Grasp a 2-3 lb weight or an exercise band/tubing so that your hand is "thumbs-up," like when you shake hands.  Slowly lift your right / left hand from your thigh into the air, traveling about 30 degrees from straight out at your side. Lift your hand to shoulder height or  as far as you can without increasing any shoulder pain. Initially, many people do not lift their hands above shoulder height.  Avoid shrugging your right / left shoulder as your arm rises by keeping your shoulder blade tucked down and toward your mid-back spine.  Hold for 4-5 seconds. Control the descent of your hand as you slowly return to your starting position. Repeat 2 times. Complete this exercise every other day.   STRENGTH - Shoulder Extensors  Secure a rubber exercise band/tubing so that it is at the height of your shoulders when you are either standing or sitting on a firm arm-less chair.  With a thumbs-up grip, grasp an end of the band/tubing in each hand. Straighten your elbows and lift your hands straight in front of you at shoulder height. Step back away from the secured end of band/tubing until it becomes tense.  Squeezing your  shoulder blades together, pull your hands down to the sides of your thighs. Do not allow your hands to go behind you.  Hold for 5 seconds. Slowly ease the tension on the band/tubing as you reverse the directions and return to the starting position. Repeat 2 times. Complete this exercise every other day.   STRENGTH - Scapular Retractors  Secure a rubber exercise band/tubing so that it is at the height of your shoulders when you are either standing or sitting on a firm arm-less chair.  With a palm-down grip, grasp an end of the band/tubing in each hand. Straighten your elbows and lift your hands straight in front of you at shoulder height. Step back away from the secured end of band/tubing until it becomes tense.  Squeezing your shoulder blades together, draw your elbows back as you bend them. Keep your upper arm lifted away from your body throughout the exercise.  Hold __________ seconds. Slowly ease the tension on the band/tubing as you reverse the directions and return to the starting position. Repeat __________ times. Complete this exercise __________ times per day.  STRENGTH - Scapular Depressors  Find a sturdy chair without wheels, such as a from a dining room table.  Keeping your feet on the floor, lift your bottom from the seat and lock your elbows.  Keeping your elbows straight, allow gravity to pull your body weight down. Your shoulders will rise toward your ears.  Raise your body against gravity by drawing your shoulder blades down your back, shortening the distance between your shoulders and ears. Although your feet should always maintain contact with the floor, your feet should progressively support less body weight as you get stronger.  Hold __________ seconds. In a controlled and slow manner, lower your body weight to begin the next repetition. Repeat 2 times. Complete this exercise every other day.   This information is not intended to replace advice given to you by your  health care provider. Make sure you discuss any questions you have with your health care provider.  Document Released: 02/18/2005 Document Revised: 04/27/2014 Document Reviewed: 07/19/2008 Elsevier Interactive Patient Education [employer]

## 2016-10-05 ENCOUNTER — Ambulatory Visit: Payer: 59 | Attending: Family Medicine | Admitting: Physical Therapy

## 2016-10-05 DIAGNOSIS — R293 Abnormal posture: Secondary | ICD-10-CM | POA: Diagnosis not present

## 2016-10-05 DIAGNOSIS — G8929 Other chronic pain: Secondary | ICD-10-CM | POA: Diagnosis not present

## 2016-10-05 DIAGNOSIS — M25512 Pain in left shoulder: Secondary | ICD-10-CM | POA: Diagnosis not present

## 2016-10-05 DIAGNOSIS — M25612 Stiffness of left shoulder, not elsewhere classified: Secondary | ICD-10-CM | POA: Diagnosis not present

## 2016-10-05 NOTE — Patient Instructions (Signed)
Cane Exercise: Flexion    Lie on back, holding cane above chest. Keeping arms as straight as possible, lower cane toward floor beyond head. Hold _5-10_ seconds. Repeat _15___ times. Do _2___ sessions per day.   Cane Exercise: Abduction    Hold cane with right hand over end, palm-up, with other hand palm-down. Move arm out from side and up by pushing with other arm. Hold __5-10__ seconds. Repeat __15__ times. Do __2__ sessions per day.   Strengthening: Isometric Flexion  Using wall for resistance, press right fist into ball using light pressure. Hold __5__ seconds. Repeat __15__ times per set. Do __2__ sets per session.   SHOULDER: Abduction (Isometric)  Use wall as resistance. Press arm against pillow. Keep elbow straight. Hold _5__ seconds. __15_ reps per set, __2_ sets per day  Extension (Isometric)  Place left bent elbow and back of arm against wall. Press elbow against wall. Hold __5__ seconds. Repeat __15__ times. Do __2__ sessions per day.  Internal Rotation (Isometric)  Place palm of right fist against door frame, with elbow bent. Press fist against door frame. Hold __5__ seconds. Repeat _15___ times. Do __2__ sessions per day.  External Rotation (Isometric)  Place back of left fist against door frame, with elbow bent. Press fist against door frame. Hold _5___ seconds. Repeat __15__ times. Do __2__ sessions per day.    Scapular Retraction (Standing)    With arms at sides, pinch shoulder blades together. Repeat __15__ times per set. Do _2___ sets per session. Do __2__ sessions per day.

## 2016-10-05 NOTE — Therapy (Signed)
[facility] Outpatient Rehabilitation [facility] [address]  [address] [place], [place], [address] Phone: [phone]   Fax:  [phone]  Physical Therapy Evaluation  Patient Details  Name: Michael Guerra MRN: [id number] Date of Birth: Mar 26, 1961 Referring Provider: Dr. Arva Chafe  Encounter Date: 10/05/2016      PT End of Session - 10/05/16 1049    Visit Number 1   Number of Visits 12   Date for PT Re-Evaluation 11/16/16   PT Start Time 1007   PT Stop Time 1048   PT Time Calculation (min) 41 min   Activity Tolerance Patient tolerated treatment well   Behavior During Therapy [facility] for tasks assessed/performed      Past Medical History:  Diagnosis Date  . Diabetes mellitus   . Hyperlipidemia   . Hypertension   . Solitary kidney     Past Surgical History:  Procedure Laterality Date  . ABDOMINAL SURGERY    . KIDNEY SURGERY      There were no vitals filed for this visit.       Subjective Assessment - 10/05/16 1008    Subjective Patient reporting L shoulder pain for a couple months - pain in certain directions (cross body, weight bearing, reaching behind back). Has primarily noticed reduced ROM and pain with sleeping on L side. Denies Numbness and tingling into hand. Has not had imaging. 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    Patient is accompained by: Family member  wife   Pertinent History DM - controlled   Diagnostic tests none   Patient Stated Goals reduce pain   Currently in Pain? Yes   Pain Score 1    Pain Location Shoulder   Pain Orientation Left   Pain Descriptors / Indicators Aching;Sore   Pain Type Chronic pain   Pain Onset More than a month ago   Pain Frequency Intermittent   Aggravating Factors  cross body, reaching behind back, weight bearing   Pain Relieving Factors nothing noted            [facility] PT Assessment - 10/05/16 1001      Assessment   Medical Diagnosis Chronic L  shoulder pain   Referring Provider Dr. Arva Chafe   Onset Date/Surgical Date --  couple months ago   Hand Dominance Right   Next MD Visit 10/15/16   Prior Therapy no     Precautions   Precautions None     Restrictions   Weight Bearing Restrictions No     Balance Screen   Has the patient fallen in the past 6 months No   Has the patient had a decrease in activity level because of a fear of falling?  No   Is the patient reluctant to leave their home because of a fear of falling?  No     Home Environment   Living Environment Private residence   Living Arrangements Spouse/significant other   Type of Home House     Prior Function   Level of Independence Independent   Vocation Full time employment   Psychologist, forensic guard for [employer]   Leisure exercise     Cognition   Overall Cognitive Status Within Functional Limits for tasks assessed     Observation/Other Assessments   Focus on Therapeutic Outcomes (FOTO)  Shoulder: 57 (43% limited, predicted 28% limited)     Sensation   Light Touch Appears Intact     Coordination   Gross  Motor Movements are Fluid and Coordinated Yes     Posture/Postural Control   Posture/Postural Control Postural limitations   Postural Limitations Rounded Shoulders;Forward head     ROM / Strength   AROM / PROM / Strength AROM;Strength;PROM     AROM   AROM Assessment Site Shoulder   Right/Left Shoulder Right;Left   Right Shoulder Flexion 170 Degrees   Right Shoulder ABduction 155 Degrees   Right Shoulder Internal Rotation --  FIR to approx T8   Right Shoulder External Rotation --  FER to approx T4   Left Shoulder Flexion 158 Degrees   Left Shoulder ABduction 95 Degrees   Left Shoulder Internal Rotation --  FIR to approx sacrum   Left Shoulder External Rotation --  FER to approx C5-C6     PROM   PROM Assessment Site Shoulder   Right/Left Shoulder Right   Right Shoulder Flexion 170 Degrees   Right Shoulder ABduction 135  Degrees   Right Shoulder Internal Rotation 55 Degrees   Right Shoulder External Rotation 72 Degrees     Strength   Strength Assessment Site Shoulder   Right/Left Shoulder Right;Left   Right Shoulder Flexion 4+/5   Right Shoulder ABduction 5/5   Right Shoulder Internal Rotation 4+/5   Right Shoulder External Rotation 4+/5   Left Shoulder Flexion 4-/5   Left Shoulder ABduction 4-/5   Left Shoulder Internal Rotation 4/5   Left Shoulder External Rotation 4/5     Palpation   Palpation comment diffusely non-tender     Special Tests    Special Tests Rotator Cuff Impingement   Rotator Cuff Impingment tests Leanord Asal test     Hawkins-Kennedy test   Findings Positive   Side Left            Objective measurements completed on examination: See above findings.          [facility] Adult PT Treatment/Exercise - 10/05/16 1001      Exercises   Exercises Shoulder     Shoulder Exercises: Supine   Internal Rotation AAROM;Left;10 reps   Internal Rotation Limitations with towel   Flexion AAROM;Left;10 reps   ABduction AAROM;Left;10 reps     Shoulder Exercises: Isometric Strengthening   Flexion 5X5"   Flexion Limitations with towel   Extension 5X5"   Extension Limitations with towel   External Rotation 5X5"   External Rotation Limitations with towel   Internal Rotation 5X5"   Internal Rotation Limitations with towel   ABduction 5X5"   ABduction Limitations with towel                PT Education - 10/05/16 1044    Education provided Yes   Education Details exam findings, POC, HEP   Person(s) Educated Patient;Spouse   Methods Explanation;Demonstration;Handout   Comprehension Verbalized understanding;Returned demonstration;Need further instruction          PT Short Term Goals - 10/05/16 1113      PT SHORT TERM GOAL #1   Title patient to be independent with HEP (10/26/16)   Status New           PT Long Term Goals - 10/05/16 1113      PT LONG TERM  GOAL #1   Title Patient to be independent with advanced HEP (11/16/16)   Status New     PT LONG TERM GOAL #2   Title Patient to demonstrate L UE AROM equal to that of R UE without pain limiting (11/16/16)   Status New  PT LONG TERM GOAL #3   Title Patient to demonstrate good postural awareness with ability to self correct (11/16/16)   Status New     PT LONG TERM GOAL #4   Title Patient to report ability to return to work and leisure activities without pain limiting function (11/16/16)   Status New     PT LONG TERM GOAL #5   Title Patient to improve L UE strength to >/= 4+/5 (11/16/16)   Status New                Plan - 10/05/16 1055    Clinical Impression Statement Patient is a 56 y/o male presenting to OPPT today for initial evaluation of L shoulder pain with no known mechanism of injury. 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 Patient today with reduced AROM of L shoulder, however, able to achieve more passively by PT. Good rotator cuff strength today which is reassuring, some pain with impingement provocation testing today, likely main contributor for onset of L shoulder pain. Patient to benefit from PT to address functional use of L shoulder to allow apteint to return to work duties as well as leisure activities with full function and reduced pain.    History and Personal Factors relevant to plan of care: controlled DM   Clinical Presentation Stable   Clinical Presentation due to: good RTC strength, ROM primary limiting factor   Clinical Decision Making Low   Rehab Potential Good   PT Frequency 2x / week   PT Duration 6 weeks   PT Treatment/Interventions ADLs/Self Care Home Management;Cryotherapy;Electrical Stimulation;Iontophoresis 4mg /ml Dexamethasone;Moist Heat;Ultrasound;Neuromuscular re-education;Therapeutic exercise;Therapeutic activities;Functional mobility training;Patient/family education;Manual  techniques;Passive range of motion;Vasopneumatic Device;Taping;Dry needling   Consulted and Agree with Plan of Care Patient      Patient will benefit from skilled therapeutic intervention in order to improve the following deficits and impairments:  Decreased activity tolerance, Decreased range of motion, Decreased strength, Postural dysfunction, Impaired UE functional use, Pain  Visit Diagnosis: Chronic left shoulder pain - Plan: PT plan of care cert/re-cert  Stiffness of left shoulder, not elsewhere classified - Plan: PT plan of care cert/re-cert  Abnormal posture - Plan: PT plan of care cert/re-cert     Problem List Patient Active Problem List   Diagnosis Date Noted  . Diabetes mellitus type 2 in nonobese (HCC) 06/26/2016  . LIVER FUNCTION TESTS, ABNORMAL 05/21/2006  . ALLERGIC RHINITIS, SEASONAL 02/17/2006  . INTESTINAL ADHESIONS WITH OBSTRUCTION 02/17/2006  . KELOID 02/17/2006  . NEPHRECTOMY, HX OF 02/17/2006     Kipp Laurence, PT, DPT 10/05/16 11:19 AM   [facility] [address]  [address] [place], [place], [address] Phone: [phone]   Fax:  [phone]  Name: Michael Guerra MRN: [id number] Date of Birth: 02/11/1961

## 2016-10-07 ENCOUNTER — Ambulatory Visit: Payer: 59 | Admitting: Physical Therapy

## 2016-10-07 DIAGNOSIS — R293 Abnormal posture: Secondary | ICD-10-CM | POA: Diagnosis not present

## 2016-10-07 DIAGNOSIS — M25512 Pain in left shoulder: Secondary | ICD-10-CM | POA: Diagnosis not present

## 2016-10-07 DIAGNOSIS — G8929 Other chronic pain: Secondary | ICD-10-CM | POA: Diagnosis not present

## 2016-10-07 DIAGNOSIS — M25612 Stiffness of left shoulder, not elsewhere classified: Secondary | ICD-10-CM

## 2016-10-07 NOTE — Patient Instructions (Addendum)
Resistive Band Rowing    With resistive band anchored in door, grasp both ends. Keeping elbows bent, pull back, squeezing shoulder blades together. Hold __5__ seconds. Repeat _15___ times. Do __2__ sessions per day.   IONTOPHORESIS PATIENT PRECAUTIONS & CONTRAINDICATIONS:  . Redness under one or both electrodes can occur.  This characterized by a uniform redness that usually disappears within 12 hours of treatment. . Small pinhead size blisters may result in response to the drug.  Contact your physician if the problem persists more than 24 hours. . On rare occasions, iontophoresis therapy can result in temporary skin reactions such as rash, inflammation, irritation or burns.  The skin reactions may be the result of individual sensitivity to the ionic solution used, the condition of the skin at the start of treatment, reaction to the materials in the electrodes, allergies or sensitivity to dexamethasone, or a poor connection between the patch and your skin.  Discontinue using iontophoresis if you have any of these reactions and report to your therapist. . Remove the Patch or electrodes if you have any undue sensation of pain or burning during the treatment and report discomfort to your therapist. . Tell your Therapist if you have had known adverse reactions to the application of electrical current. . If using the Patch, the LED light will turn off when treatment is complete and the patch can be removed.  Approximate treatment time is 1-3 hours.  Remove the patch when light goes off or after 6 hours. . The Patch can be worn during normal activity, however excessive motion where the electrodes have been placed can cause poor contact between the skin and the electrode or uneven electrical current resulting in greater risk of skin irritation. Marland Kitchen Keep out of the reach of children.   . DO NOT use if you have a cardiac pacemaker or any other electrically sensitive implanted device. . DO NOT use if you  have a known sensitivity to dexamethasone. . DO NOT use during Magnetic Resonance Imaging (MRI). . DO NOT use over broken or compromised skin (e.g. sunburn, cuts, or acne) due to the increased risk of skin reaction. . DO NOT SHAVE over the area to be treated:  To establish good contact between the Patch and the skin, excessive hair may be clipped. . DO NOT place the Patch or electrodes on or over your eyes, directly over your heart, or brain. . DO NOT reuse the Patch or electrodes as this may cause burns to occur.

## 2016-10-07 NOTE — Therapy (Signed)
[facility] Outpatient Rehabilitation [facility] [address]  [address] [place], [place], [address] Phone: [phone]   Fax:  [phone]  Physical Therapy Treatment  Patient Details  Name: Michael Guerra MRN: [id number] Date of Birth: 28-Feb-1961 Referring Provider: Dr. Arva Chafe  Encounter Date: 10/07/2016      PT End of Session - 10/07/16 0803    Visit Number 2   Number of Visits 12   Date for PT Re-Evaluation 11/16/16   PT Start Time 0801   PT Stop Time 0840   PT Time Calculation (min) 39 min   Activity Tolerance Patient tolerated treatment well   Behavior During Therapy [facility] for tasks assessed/performed      Past Medical History:  Diagnosis Date  . Diabetes mellitus   . Hyperlipidemia   . Hypertension   . Solitary kidney     Past Surgical History:  Procedure Laterality Date  . ABDOMINAL SURGERY    . KIDNEY SURGERY      There were no vitals filed for this visit.      Subjective Assessment - 10/07/16 0802    Subjective Feeling well today - has not yet done HEP given given at initial eval (says hes been busy)   Pertinent History DM - controlled   Patient Stated Goals reduce pain   Currently in Pain? No/denies   Pain Score 0-No pain                         [facility] Adult PT Treatment/Exercise - 10/07/16 0803      Shoulder Exercises: Supine   Horizontal ABduction Strengthening;15 reps;Theraband   Theraband Level (Shoulder Horizontal ABduction) Level 2 (Red)   Horizontal ABduction Limitations hooklying on 1/2 foam roller   External Rotation Strengthening;Both;15 reps;Theraband   Theraband Level (Shoulder External Rotation) Level 2 (Red)   External Rotation Limitations hooklying on 1/2 foam roller   Other Supine Exercises serratus punch x 15 reps - 4#     Shoulder Exercises: Standing   External Rotation Strengthening;Left;15 reps;Theraband   Theraband Level (Shoulder External Rotation) Level 2 (Red)   Internal  Rotation Strengthening;Left;15 reps;Theraband   Theraband Level (Shoulder Internal Rotation) Level 2 (Red)   Row Strengthening;Both;15 reps;Theraband   Theraband Level (Shoulder Row) Level 2 (Red)     Shoulder Exercises: Pulleys   Flexion 2 minutes   ABduction 2 minutes     Modalities   Modalities Iontophoresis     Iontophoresis   Type of Iontophoresis Dexamethasone   Location L shoulder   Dose 1.0 mL   Time 80 mA; 4-6 hours     Manual Therapy   Manual Therapy Joint mobilization;Soft tissue mobilization;Passive ROM   Manual therapy comments patient hooklying with bolster under knees   Joint Mobilization Grade II-III PA and inferior mobs to L GH joint   Soft tissue mobilization STM to anterior and posterior L GH joint musculature   Passive ROM PROM all planes - pain at end ranges.                   PT Short Term Goals - 10/05/16 1113      PT SHORT TERM GOAL #1   Title patient to be independent with HEP (10/26/16)   Status New           PT Long Term Goals - 10/05/16 1113      PT LONG TERM GOAL #1   Title Patient to be independent  with advanced HEP (11/16/16)   Status New     PT LONG TERM GOAL #2   Title Patient to demonstrate L UE AROM equal to that of R UE without pain limiting (11/16/16)   Status New     PT LONG TERM GOAL #3   Title Patient to demonstrate good postural awareness with ability to self correct (11/16/16)   Status New     PT LONG TERM GOAL #4   Title Patient to report ability to return to work and leisure activities without pain limiting function (11/16/16)   Status New     PT LONG TERM GOAL #5   Title Patient to improve L UE strength to >/= 4+/5 (11/16/16)   Status New               Plan - 10/07/16 0803    Clinical Impression Statement Maxon doing well today - continued reports of L shoulder pain when sleeping last night, as well as pain at end ranges of movement with passive ROM. Patient doing well with initial strengthening  program today with little issue. Ionto initiated today with consent and informational handout given.    PT Treatment/Interventions ADLs/Self Care Home Management;Cryotherapy;Electrical Stimulation;Iontophoresis 4mg /ml Dexamethasone;Moist Heat;Ultrasound;Neuromuscular re-education;Therapeutic exercise;Therapeutic activities;Functional mobility training;Patient/family education;Manual techniques;Passive range of motion;Vasopneumatic Device;Taping;Dry needling   Consulted and Agree with Plan of Care Patient      Patient will benefit from skilled therapeutic intervention in order to improve the following deficits and impairments:  Decreased activity tolerance, Decreased range of motion, Decreased strength, Postural dysfunction, Impaired UE functional use, Pain  Visit Diagnosis: Chronic left shoulder pain  Stiffness of left shoulder, not elsewhere classified  Abnormal posture     Problem List Patient Active Problem List   Diagnosis Date Noted  . Diabetes mellitus type 2 in nonobese (HCC) 06/26/2016  . LIVER FUNCTION TESTS, ABNORMAL 05/21/2006  . ALLERGIC RHINITIS, SEASONAL 02/17/2006  . INTESTINAL ADHESIONS WITH OBSTRUCTION 02/17/2006  . KELOID 02/17/2006  . NEPHRECTOMY, HX OF 02/17/2006     Kipp Laurence, PT, DPT 10/07/16 10:19 AM   [facility] [address]  [address] [place], [place], [address] Phone: [phone]   Fax:  [phone]  Name: Michael Guerra MRN: [id number] Date of Birth: [birth year]

## 2016-10-15 ENCOUNTER — Ambulatory Visit: Payer: 59 | Admitting: Physical Therapy

## 2016-10-15 ENCOUNTER — Ambulatory Visit: Payer: 59 | Admitting: Family Medicine

## 2016-10-15 DIAGNOSIS — M25512 Pain in left shoulder: Secondary | ICD-10-CM | POA: Diagnosis not present

## 2016-10-15 DIAGNOSIS — G8929 Other chronic pain: Secondary | ICD-10-CM

## 2016-10-15 DIAGNOSIS — M25612 Stiffness of left shoulder, not elsewhere classified: Secondary | ICD-10-CM

## 2016-10-15 DIAGNOSIS — R293 Abnormal posture: Secondary | ICD-10-CM

## 2016-10-15 MED FILL — JANUVIA 100 MG TABLET: 100 | 30 days supply | Qty: 30 | Fill #2

## 2016-10-15 NOTE — Therapy (Signed)
[facility] Outpatient Rehabilitation [facility] [address]  [address] [place], [place], [address] Phone: [phone]   Fax:  [phone]  Physical Therapy Treatment  Patient Details  Name: Michael Guerra MRN: [id number] Date of Birth: [birth year] Referring Provider: Dr. Arva Chafe  Encounter Date: 10/15/2016      PT End of Session - 10/15/16 0938    Visit Number 3   Number of Visits 12   Date for PT Re-Evaluation 11/16/16   PT Start Time 0932   PT Stop Time 1015   PT Time Calculation (min) 43 min   Activity Tolerance Patient tolerated treatment well   Behavior During Therapy [facility] for tasks assessed/performed      Past Medical History:  Diagnosis Date  . Diabetes mellitus   . Hyperlipidemia   . Hypertension   . Solitary kidney     Past Surgical History:  Procedure Laterality Date  . ABDOMINAL SURGERY    . KIDNEY SURGERY      There were no vitals filed for this visit.      Subjective Assessment - 10/15/16 0936    Subjective Has noticed his motion getting somewhat better, some motions still hurt. Had some pain relief with ionto patch.    Pertinent History DM - controlled   Patient Stated Goals reduce pain   Currently in Pain? Yes   Pain Score 1    Pain Location Shoulder   Pain Orientation Left   Pain Descriptors / Indicators Sore  "aware"                         [facility] Adult PT Treatment/Exercise - 10/15/16 2778      Shoulder Exercises: Seated   Horizontal ABduction Strengthening;Both;15 reps;Theraband   Theraband Level (Shoulder Horizontal ABduction) Level 3 (Green)   Horizontal ABduction Limitations with scap squeeze   External Rotation Strengthening;Both;15 reps;Theraband   Theraband Level (Shoulder External Rotation) Level 3 (Green)   External Rotation Limitations with scap squeeze     Shoulder Exercises: Standing   External Rotation Strengthening;Left;15 reps;Theraband   Theraband Level (Shoulder External  Rotation) Level 2 (Red)   Internal Rotation Strengthening;Left;15 reps;Theraband   Theraband Level (Shoulder Internal Rotation) Level 2 (Red)   Other Standing Exercises PNF D1/D1 resisted - 4 way  - red tband x 15 each     Shoulder Exercises: ROM/Strengthening   UBE (Upper Arm Bike) L2.5 x 6 min (3/3)     Shoulder Exercises: Body Blade   Flexion 15 seconds;3 reps   Flexion Limitations B UE   External Rotation 15 seconds;3 reps   External Rotation Limitations L UE     Manual Therapy   Manual Therapy Joint mobilization;Passive ROM   Manual therapy comments patient hooklying with bolster under knees   Joint Mobilization Grade III-IV PA and inferior mobs to L GH joint   Passive ROM PROM in all planes - pain at end ranges; improved motion after joint mobs                  PT Short Term Goals - 10/15/16 1022      PT SHORT TERM GOAL #1   Title patient to be independent with HEP (10/26/16)   Status On-going           PT Long Term Goals - 10/15/16 1022      PT LONG TERM GOAL #1   Title Patient to be independent with advanced HEP (11/16/16)  Status On-going     PT LONG TERM GOAL #2   Title Patient to demonstrate L UE AROM equal to that of R UE without pain limiting (11/16/16)   Status On-going     PT LONG TERM GOAL #3   Title Patient to demonstrate good postural awareness with ability to self correct (11/16/16)   Status On-going     PT LONG TERM GOAL #4   Title Patient to report ability to return to work and leisure activities without pain limiting function (11/16/16)   Status On-going     PT LONG TERM GOAL #5   Title Patient to improve L UE strength to >/= 4+/5 (11/16/16)   Status On-going               Plan - 10/15/16 1610    Clinical Impression Statement Patient doing well today - reports improvement in general motion but does have pain when reaching behind back and cross body with impingement type symptoms. Patient doing well today with all strengthening  and motor control of L UE. WIll conitnue to progress for maximum functional use of L UE.    PT Treatment/Interventions ADLs/Self Care Home Management;Cryotherapy;Electrical Stimulation;Iontophoresis 4mg /ml Dexamethasone;Moist Heat;Ultrasound;Neuromuscular re-education;Therapeutic exercise;Therapeutic activities;Functional mobility training;Patient/family education;Manual techniques;Passive range of motion;Vasopneumatic Device;Taping;Dry needling   Consulted and Agree with Plan of Care Patient      Patient will benefit from skilled therapeutic intervention in order to improve the following deficits and impairments:  Decreased activity tolerance, Decreased range of motion, Decreased strength, Postural dysfunction, Impaired UE functional use, Pain  Visit Diagnosis: Chronic left shoulder pain  Stiffness of left shoulder, not elsewhere classified  Abnormal posture     Problem List Patient Active Problem List   Diagnosis Date Noted  . Diabetes mellitus type 2 in nonobese (HCC) 06/26/2016  . LIVER FUNCTION TESTS, ABNORMAL 05/21/2006  . ALLERGIC RHINITIS, SEASONAL 02/17/2006  . INTESTINAL ADHESIONS WITH OBSTRUCTION 02/17/2006  . KELOID 02/17/2006  . NEPHRECTOMY, HX OF 02/17/2006     Kipp Laurence, PT, DPT 10/15/16 10:54 AM   [facility] [address]  [address] [place], [place], [address] Phone: [phone]   Fax:  [phone]  Name: Michael Guerra MRN: [id number] Date of Birth: 1961-03-25

## 2016-10-15 NOTE — Patient Instructions (Signed)
Resistance: PNF D1 Extension    Opposite side toward anchor in shoulder width stance. Right palm back, pull down and away, rotating hand and arm. Palm starts and ends facing backward. Repeat _15__ times. Repeat with other arm for set.   Resistance: PNF D1 Flexion    Side toward anchor in shoulder width stance. Left palm back, pull up and across body to over head, rotating hand and arm. Palm starts and ends facing backward. Repeat _15__ times. Repeat with other arm for set.   Resistance: PNF D2 Extension    Side toward anchor in shoulder width stance. Left thumb pointing back (hitchhike), pull down across body, rotating hand and arm to palm back. Repeat _15__ times.   Resistance: PNF D2 Flexion    Opposite side toward anchor in shoulder width stance. Right palm back, pull across body and up. Rotate hand and arm to end with thumb pointing back (hitchhike). Repeat _15__ times.

## 2016-10-16 ENCOUNTER — Ambulatory Visit: Payer: 59 | Admitting: Physical Therapy

## 2016-10-16 DIAGNOSIS — R293 Abnormal posture: Secondary | ICD-10-CM

## 2016-10-16 DIAGNOSIS — G8929 Other chronic pain: Secondary | ICD-10-CM | POA: Diagnosis not present

## 2016-10-16 DIAGNOSIS — M25512 Pain in left shoulder: Principal | ICD-10-CM

## 2016-10-16 DIAGNOSIS — M25612 Stiffness of left shoulder, not elsewhere classified: Secondary | ICD-10-CM | POA: Diagnosis not present

## 2016-10-16 NOTE — Therapy (Signed)
[facility] Outpatient Rehabilitation [facility] [address]  [address] [place], [place], [address] Phone: [phone]   Fax:  [phone]  Physical Therapy Treatment  Patient Details  Name: Michael Guerra MRN: [id number] Date of Birth: 04-20-1961 Referring Provider: Dr. Arva Chafe  Encounter Date: 10/16/2016      PT End of Session - 10/16/16 1019    Visit Number 4   Number of Visits 12   Date for PT Re-Evaluation 11/16/16   PT Start Time 1017   PT Stop Time 1055   PT Time Calculation (min) 38 min   Activity Tolerance Patient tolerated treatment well   Behavior During Therapy [facility] for tasks assessed/performed      Past Medical History:  Diagnosis Date  . Diabetes mellitus   . Hyperlipidemia   . Hypertension   . Solitary kidney     Past Surgical History:  Procedure Laterality Date  . ABDOMINAL SURGERY    . KIDNEY SURGERY      There were no vitals filed for this visit.      Subjective Assessment - 10/16/16 1018    Subjective Feeling well - reports he will be push mowing today - feels like he may want ionto patch   Pertinent History DM - controlled   Patient Stated Goals reduce pain   Currently in Pain? No/denies   Pain Score 0-No pain                         [facility] Adult PT Treatment/Exercise - 10/16/16 1020      Shoulder Exercises: Prone   Extension Strengthening;Both;15 reps;Weights   Extension Weight (lbs) 1   Extension Limitations prone I's on green pball   Horizontal ABduction 1 Strengthening;Both;15 reps;Weights   Horizontal ABduction 1 Weight (lbs) 1   Horizontal ABduction 1 Limitations Prone T's on green pball   Horizontal ABduction 2 Strengthening;Both;15 reps;Weights   Horizontal ABduction 2 Weight (lbs) 1   Horizontal ABduction 2 Limitations Prone Y's on green pball     Shoulder Exercises: Standing   Other Standing Exercises PNF D1/D2 flexion/extension resisted - 4 way  - red tband x 15 each     Shoulder Exercises: ROM/Strengthening   UBE (Upper Arm Bike) L3 x 6 min (3/3)   Cybex Row 15 reps   Cybex Row Limitations 25# - narrow grip   Rhythmic Stabilization, Supine 5 x 30 seconds - proximal to distal UE   Other ROM/Strengthening Exercises  BATCA pull down - 20# x 15 reps     Modalities   Modalities Iontophoresis     Iontophoresis   Type of Iontophoresis Dexamethasone   Location L shoulder   Dose 1.0 mL   Time 80 mA; 4-6 hours                  PT Short Term Goals - 10/15/16 1022      PT SHORT TERM GOAL #1   Title patient to be independent with HEP (10/26/16)   Status On-going           PT Long Term Goals - 10/15/16 1022      PT LONG TERM GOAL #1   Title Patient to be independent with advanced HEP (11/16/16)   Status On-going     PT LONG TERM GOAL #2   Title Patient to demonstrate L UE AROM equal to that of R UE without pain limiting (11/16/16)   Status On-going  PT LONG TERM GOAL #3   Title Patient to demonstrate good postural awareness with ability to self correct (11/16/16)   Status On-going     PT LONG TERM GOAL #4   Title Patient to report ability to return to work and leisure activities without pain limiting function (11/16/16)   Status On-going     PT LONG TERM GOAL #5   Title Patient to improve L UE strength to >/= 4+/5 (11/16/16)   Status On-going               Plan - 10/16/16 1020    Clinical Impression Statement Patient with no pain today - Doing well with all strengthening tasks in session, however, does tend to heavily recruit L UT with all activities. Ionto applied today as patient reports some pain and likely pain later today with manual mowing. WIll continue to progress towards goals.    PT Treatment/Interventions ADLs/Self Care Home Management;Cryotherapy;Electrical Stimulation;Iontophoresis 4mg /ml Dexamethasone;Moist Heat;Ultrasound;Neuromuscular re-education;Therapeutic exercise;Therapeutic activities;Functional mobility  training;Patient/family education;Manual techniques;Passive range of motion;Vasopneumatic Device;Taping;Dry needling   Consulted and Agree with Plan of Care Patient      Patient will benefit from skilled therapeutic intervention in order to improve the following deficits and impairments:  Decreased activity tolerance, Decreased range of motion, Decreased strength, Postural dysfunction, Impaired UE functional use, Pain  Visit Diagnosis: Chronic left shoulder pain  Stiffness of left shoulder, not elsewhere classified  Abnormal posture     Problem List Patient Active Problem List   Diagnosis Date Noted  . Diabetes mellitus type 2 in nonobese (HCC) 06/26/2016  . LIVER FUNCTION TESTS, ABNORMAL 05/21/2006  . ALLERGIC RHINITIS, SEASONAL 02/17/2006  . INTESTINAL ADHESIONS WITH OBSTRUCTION 02/17/2006  . KELOID 02/17/2006  . NEPHRECTOMY, HX OF 02/17/2006    Kipp Laurence, PT, DPT 10/16/16 11:08 AM   [facility] [address]  [address] [place], [place], [address] Phone: [phone]   Fax:  [phone]  Name: JLYNN LANGILLE MRN: [id number] Date of Birth: 27-Jan-1961

## 2016-10-19 ENCOUNTER — Ambulatory Visit: Payer: 59 | Attending: Family Medicine | Admitting: Physical Therapy

## 2016-10-19 ENCOUNTER — Ambulatory Visit (INDEPENDENT_AMBULATORY_CARE_PROVIDER_SITE_OTHER): Payer: 59 | Admitting: Family Medicine

## 2016-10-19 ENCOUNTER — Encounter: Payer: Self-pay | Admitting: Family Medicine

## 2016-10-19 VITALS — BP 132/82 | HR 88 | Temp 98.7°F | Ht 73.0 in | Wt 218.2 lb

## 2016-10-19 DIAGNOSIS — M25612 Stiffness of left shoulder, not elsewhere classified: Secondary | ICD-10-CM | POA: Diagnosis not present

## 2016-10-19 DIAGNOSIS — G8929 Other chronic pain: Secondary | ICD-10-CM | POA: Insufficient documentation

## 2016-10-19 DIAGNOSIS — M25512 Pain in left shoulder: Secondary | ICD-10-CM | POA: Diagnosis not present

## 2016-10-19 DIAGNOSIS — R293 Abnormal posture: Secondary | ICD-10-CM | POA: Insufficient documentation

## 2016-10-19 NOTE — Progress Notes (Signed)
Chief Complaint  Patient presents with  . Shoulder Pain    left, is better since PT    Subjective: Patient is a 56 y.o. male here for shoulder pain f/u.  Around 1 month ago, he was referred to physical therapy for left shoulder pain. He has been going to therapy and is compliant with his home exercise program. He states he is about 45% improved. His strength and range of motion are improving. He is frustrated that he is not instantly better but understands that it could take time. We did not get an x-ray because of out-of-pocket costs.   Family History  Problem Relation Age of Onset  . Cancer Neg Hx    Past Medical History:  Diagnosis Date  . Diabetes mellitus   . Hyperlipidemia   . Hypertension   . Solitary kidney    Allergies  Allergen Reactions  . Lisinopril Cough    Current Outpatient Prescriptions:  .  anastrozole (ARIMIDEX) 1 MG tablet, Take 1 tablet by mouth 2 (two) times daily., Disp: , Rfl: 11 .  aspirin 81 MG tablet, Take 81 mg by mouth daily.  , Disp: , Rfl:  .  clomiPHENE (CLOMID) 50 MG tablet, Take 1 tablet by mouth daily., Disp: , Rfl: 11 .  metFORMIN (GLUCOPHAGE) 1000 MG tablet, Take 1 tablet (1,000 mg total) by mouth 2 (two) times daily with a meal., Disp: 180 tablet, Rfl: 3 .  Multiple Vitamins-Minerals (MULTIVITAMIN ADULT) TABS, Take 1 tablet by mouth daily., Disp: , Rfl:  .  Omega-3 Fatty Acids (FISH OIL) 1000 MG CAPS, Take 1 capsule by mouth daily., Disp: , Rfl:  .  pravastatin (PRAVACHOL) 40 MG tablet, Take 1 tablet (40 mg total) by mouth daily., Disp: 90 tablet, Rfl: 3 .  sitaGLIPtin (JANUVIA) 100 MG tablet, Take 1 tablet (100 mg total) by mouth daily., Disp: 30 tablet, Rfl: 5  Objective: BP 132/82 (BP Location: Left Arm, Patient Position: Sitting, Cuff Size: Large)   Pulse 88   Temp 98.7 F (37.1 C) (Oral)   Ht 6\' 1"  (1.854 m)   Wt 218 lb 4 oz (99 kg)   SpO2 97%   BMI 28.79 kg/m  General: Awake, appears stated age Lungs: No accessory muscle  use MSK: limited shoulder abduction past 130-135 degrees (improved), +Neer's, Hawkins and Empty can are normal today which is improved from last visit Psych: Age appropriate judgment and insight, normal affect and mood  Procedure Note; Shoulder bursa injection Verbal consent obtained. The area was palpated, an area was marked just caudal to the acromion process laterally, and cleaned with alcohol x1. A 27-gauge needle was used to enter the subacromial space laterally with ease. 40 mg of Kenalog with 2 mL of 1% lidocaine was injected. Band-aid placed The patient tolerated the procedure well. There were no complications noted.   Assessment and Plan: Chronic left shoulder pain - Plan: PR DRAIN/INJECT LARGE JOINT/BURSA  Orders as above. Let us know if he starts to worsen or plateau and we will obtain XR vs refer to orthopedic surgery. F/u as originally scheduled for DM. The patient voiced understanding and agreement to the plan.  [name] [place], DO 10/19/16  12:15 PM

## 2016-10-19 NOTE — Progress Notes (Signed)
Pre visit review using our clinic review tool, if applicable. No additional management support is needed unless otherwise documented below in the visit note. 

## 2016-10-19 NOTE — Therapy (Signed)
[facility] Outpatient Rehabilitation [facility] [address]  [address] [place], [place], [address] Phone: [phone]   Fax:  [phone]  Physical Therapy Treatment  Patient Details  Name: Michael Guerra MRN: [id number] Date of Birth: 1960/05/22 Referring Provider: Dr. Arva Chafe  Encounter Date: 10/19/2016      PT End of Session - 10/19/16 0936    Visit Number 5   Number of Visits 12   Date for PT Re-Evaluation 11/16/16   PT Start Time 0934   PT Stop Time 1012   PT Time Calculation (min) 38 min   Activity Tolerance Patient tolerated treatment well   Behavior During Therapy [facility] for tasks assessed/performed      Past Medical History:  Diagnosis Date  . Diabetes mellitus   . Hyperlipidemia   . Hypertension   . Solitary kidney     Past Surgical History:  Procedure Laterality Date  . ABDOMINAL SURGERY    . KIDNEY SURGERY      There were no vitals filed for this visit.      Subjective Assessment - 10/19/16 0935    Subjective Felt some relief from the patch - having some soreness today due to sleeping on L shoulder   Pertinent History DM - controlled   Patient Stated Goals reduce pain   Currently in Pain? Yes   Pain Score 1    Pain Location Shoulder   Pain Orientation Left   Pain Descriptors / Indicators Sore   Pain Type Chronic pain                         [facility] Adult PT Treatment/Exercise - 10/19/16 0938      Shoulder Exercises: Supine   Other Supine Exercises serratus punch x 15 reps - 10# B UE     Shoulder Exercises: Prone   Extension Strengthening;Both;15 reps;Weights   Extension Weight (lbs) 1   Extension Limitations prone I's on green pball   Horizontal ABduction 1 Strengthening;Both;15 reps;Weights   Horizontal ABduction 1 Weight (lbs) 1   Horizontal ABduction 1 Limitations Prone T's on green pball   Horizontal ABduction 2 Strengthening;Both;15 reps;Weights   Horizontal ABduction 2 Weight (lbs) 1   Horizontal ABduction 2 Limitations Prone Y's on green pball     Shoulder Exercises: Sidelying   External Rotation Left;15 reps;Weights   External Rotation Weight (lbs) 5   External Rotation Limitations towel at thorax   ABduction Left;15 reps;Weights   ABduction Weight (lbs) 2   ABduction Limitations heavy VC and      Shoulder Exercises: Standing   Other Standing Exercises modified plank on chair - 3 x 30 seconds     Shoulder Exercises: ROM/Strengthening   UBE (Upper Arm Bike) L3.5 x 6 min (3/3)   Cybex Row 15 reps   Cybex Row Limitations 35# - narrow grip   Wall Pushups 15 reps   Pushups Limitations elbows at 45 deg   Other ROM/Strengthening Exercises  BATCA pull down - 25# x 15 reps   Other ROM/Strengthening Exercises bent over row - 5# - L UE x 15                  PT Short Term Goals - 10/15/16 1022      PT SHORT TERM GOAL #1   Title patient to be independent with HEP (10/26/16)   Status On-going           PT Long Term Goals -  10/15/16 1022      PT LONG TERM GOAL #1   Title Patient to be independent with advanced HEP (11/16/16)   Status On-going     PT LONG TERM GOAL #2   Title Patient to demonstrate L UE AROM equal to that of R UE without pain limiting (11/16/16)   Status On-going     PT LONG TERM GOAL #3   Title Patient to demonstrate good postural awareness with ability to self correct (11/16/16)   Status On-going     PT LONG TERM GOAL #4   Title Patient to report ability to return to work and leisure activities without pain limiting function (11/16/16)   Status On-going     PT LONG TERM GOAL #5   Title Patient to improve L UE strength to >/= 4+/5 (11/16/16)   Status On-going               Plan - 10/19/16 0936    Clinical Impression Statement Dorris today reporitng some muscle soreness at L shoulder. Patient continues to demonstrate heavy recruitment of UT with all active movements requiring VC and TC to reduce this with intermittent  carryover. Patient noting relief from ionto at last session. Patient continues with some impingement type symptoms when reaching across body and behind back. WIll continue to progres as patient tolerates.    PT Treatment/Interventions ADLs/Self Care Home Management;Cryotherapy;Electrical Stimulation;Iontophoresis 4mg /ml Dexamethasone;Moist Heat;Ultrasound;Neuromuscular re-education;Therapeutic exercise;Therapeutic activities;Functional mobility training;Patient/family education;Manual techniques;Passive range of motion;Vasopneumatic Device;Taping;Dry needling   Consulted and Agree with Plan of Care Patient      Patient will benefit from skilled therapeutic intervention in order to improve the following deficits and impairments:  Decreased activity tolerance, Decreased range of motion, Decreased strength, Postural dysfunction, Impaired UE functional use, Pain  Visit Diagnosis: Chronic left shoulder pain  Stiffness of left shoulder, not elsewhere classified  Abnormal posture     Problem List Patient Active Problem List   Diagnosis Date Noted  . Diabetes mellitus type 2 in nonobese (HCC) 06/26/2016  . LIVER FUNCTION TESTS, ABNORMAL 05/21/2006  . ALLERGIC RHINITIS, SEASONAL 02/17/2006  . INTESTINAL ADHESIONS WITH OBSTRUCTION 02/17/2006  . KELOID 02/17/2006  . NEPHRECTOMY, HX OF 02/17/2006     Kipp Laurence, PT, DPT 10/19/16 10:28 AM   [facility] [address]  [address] [place], [place], [address] Phone: [phone]   Fax:  [phone]  Name: TRACKER MANCE MRN: [id number] Date of Birth: Dec 17, 1960

## 2016-10-20 ENCOUNTER — Ambulatory Visit: Payer: 59 | Admitting: Physical Therapy

## 2016-10-20 ENCOUNTER — Ambulatory Visit: Payer: 59

## 2016-10-20 DIAGNOSIS — G8929 Other chronic pain: Secondary | ICD-10-CM

## 2016-10-20 DIAGNOSIS — M25612 Stiffness of left shoulder, not elsewhere classified: Secondary | ICD-10-CM | POA: Diagnosis not present

## 2016-10-20 DIAGNOSIS — R293 Abnormal posture: Secondary | ICD-10-CM

## 2016-10-20 DIAGNOSIS — M25512 Pain in left shoulder: Principal | ICD-10-CM

## 2016-10-20 NOTE — Therapy (Signed)
[facility] Outpatient Rehabilitation [facility] [address]  [address] [place], [place], [address] Phone: [phone]   Fax:  [phone]  Physical Therapy Treatment  Patient Details  Name: Michael Guerra MRN: [id number] Date of Birth: 18-Aug-1960 Referring Provider: Dr. Arva Chafe  Encounter Date: 10/20/2016      PT End of Session - 10/20/16 1536    Visit Number 6   Number of Visits 12   Date for PT Re-Evaluation 11/16/16   PT Start Time 1534   PT Stop Time 1613   PT Time Calculation (min) 39 min   Activity Tolerance Patient tolerated treatment well   Behavior During Therapy [facility] for tasks assessed/performed      Past Medical History:  Diagnosis Date  . Diabetes mellitus   . Hyperlipidemia   . Hypertension   . Solitary kidney     Past Surgical History:  Procedure Laterality Date  . ABDOMINAL SURGERY    . KIDNEY SURGERY      There were no vitals filed for this visit.      Subjective Assessment - 10/20/16 1535    Subjective Had an injection yesterday - still some pain with reaching movements.   Pertinent History DM - controlled   Patient Stated Goals reduce pain   Currently in Pain? Yes   Pain Score 1    Pain Location Shoulder   Pain Orientation Left   Pain Descriptors / Indicators Aching;Sore   Pain Type Chronic pain                         [facility] Adult PT Treatment/Exercise - 10/20/16 1539      Shoulder Exercises: Prone   Extension Strengthening;Both;10 reps;Weights   Extension Weight (lbs) 2   Extension Limitations prone I's on green pball   Horizontal ABduction 1 Strengthening;Both;10 reps;Weights   Horizontal ABduction 1 Weight (lbs) 2   Horizontal ABduction 1 Limitations Prone T's on green pball   Horizontal ABduction 2 Strengthening;Both;10 reps;Weights   Horizontal ABduction 2 Weight (lbs) 2   Horizontal ABduction 2 Limitations Prone Y's on green pball     Shoulder Exercises: Sidelying   External  Rotation Left;15 reps;Weights   External Rotation Weight (lbs) 5   External Rotation Limitations towel at thorax; with scap squeeze   Other Sidelying Exercises L open book 15 x 10 seconds     Shoulder Exercises: Therapy Ball   Flexion 15 reps   Flexion Limitations 2# at wrist     Shoulder Exercises: ROM/Strengthening   UBE (Upper Arm Bike) L3.5 x 6 min (3/3)   Cybex Row 15 reps   Cybex Row Limitations 45# - narrow grip   Wall Pushups 15 reps   Pushups Limitations elbows at 45 deg; on green pball - 2 sets   Other ROM/Strengthening Exercises TRX row x 15 reps                  PT Short Term Goals - 10/15/16 1022      PT SHORT TERM GOAL #1   Title patient to be independent with HEP (10/26/16)   Status On-going           PT Long Term Goals - 10/15/16 1022      PT LONG TERM GOAL #1   Title Patient to be independent with advanced HEP (11/16/16)   Status On-going     PT LONG TERM GOAL #2   Title Patient to demonstrate L  UE AROM equal to that of R UE without pain limiting (11/16/16)   Status On-going     PT LONG TERM GOAL #3   Title Patient to demonstrate good postural awareness with ability to self correct (11/16/16)   Status On-going     PT LONG TERM GOAL #4   Title Patient to report ability to return to work and leisure activities without pain limiting function (11/16/16)   Status On-going     PT LONG TERM GOAL #5   Title Patient to improve L UE strength to >/= 4+/5 (11/16/16)   Status On-going               Plan - 10/20/16 1536    Clinical Impression Statement Patient receivingf injection to L shoulder yesterday at MD follow-up, noticing some improvements in general pain and function. Has been manually mowing yard recently - of which he attributes to some increase in pain/soreness at L shoulder. Improved reduction in UT recruitment today during session with fewer VC required.    PT Treatment/Interventions ADLs/Self Care Home  Management;Cryotherapy;Electrical Stimulation;Iontophoresis 4mg /ml Dexamethasone;Moist Heat;Ultrasound;Neuromuscular re-education;Therapeutic exercise;Therapeutic activities;Functional mobility training;Patient/family education;Manual techniques;Passive range of motion;Vasopneumatic Device;Taping;Dry needling   Consulted and Agree with Plan of Care Patient      Patient will benefit from skilled therapeutic intervention in order to improve the following deficits and impairments:  Decreased activity tolerance, Decreased range of motion, Decreased strength, Postural dysfunction, Impaired UE functional use, Pain  Visit Diagnosis: Chronic left shoulder pain  Stiffness of left shoulder, not elsewhere classified  Abnormal posture     Problem List Patient Active Problem List   Diagnosis Date Noted  . Diabetes mellitus type 2 in nonobese (HCC) 06/26/2016  . LIVER FUNCTION TESTS, ABNORMAL 05/21/2006  . ALLERGIC RHINITIS, SEASONAL 02/17/2006  . INTESTINAL ADHESIONS WITH OBSTRUCTION 02/17/2006  . KELOID 02/17/2006  . NEPHRECTOMY, HX OF 02/17/2006     Kipp Laurence, PT, DPT 10/20/16 4:27 PM   [facility] [address]  [address] [place], [place], [address] Phone: [phone]   Fax:  [phone]  Name: Michael Guerra MRN: [id number] Date of Birth: April 21, 1960

## 2016-10-23 MED FILL — CLOMIPHENE CITRATE 50 MG TA: 50 | 30 days supply | Qty: 30 | Fill #2

## 2016-10-28 ENCOUNTER — Ambulatory Visit: Payer: 59

## 2016-10-28 DIAGNOSIS — G8929 Other chronic pain: Secondary | ICD-10-CM

## 2016-10-28 DIAGNOSIS — M25612 Stiffness of left shoulder, not elsewhere classified: Secondary | ICD-10-CM | POA: Diagnosis not present

## 2016-10-28 DIAGNOSIS — M25512 Pain in left shoulder: Principal | ICD-10-CM

## 2016-10-28 DIAGNOSIS — R293 Abnormal posture: Secondary | ICD-10-CM | POA: Diagnosis not present

## 2016-10-28 NOTE — Therapy (Signed)
[facility] [address]  [address] [place], [place], [address] Phone: [phone]   Fax:  [phone]  Physical Therapy Treatment  Patient Details  Name: Michael Guerra MRN: [id number] Date of Birth: 05/31/1960 Referring Provider: Dr. Arva Chafe  Encounter Date: 10/28/2016      PT End of Session - 10/28/16 1402    Visit Number 7   Number of Visits 12   Date for PT Re-Evaluation 11/16/16   PT Start Time 1400   PT Stop Time 1440   PT Time Calculation (min) 40 min   Activity Tolerance Patient tolerated treatment well   Behavior During Therapy [facility] for tasks assessed/performed      Past Medical History:  Diagnosis Date  . Diabetes mellitus   . Hyperlipidemia   . Hypertension   . Solitary kidney     Past Surgical History:  Procedure Laterality Date  . ABDOMINAL SURGERY    . KIDNEY SURGERY      There were no vitals filed for this visit.      Subjective Assessment - 10/28/16 1402    Subjective Pt. noting reaching behind back is main complaint at this point.     Patient Stated Goals reduce pain   Currently in Pain? No/denies   Pain Score 0-No pain   Multiple Pain Sites No                         [facility] Adult PT Treatment/Exercise - 10/28/16 1407      Shoulder Exercises: Prone   Extension Strengthening;Both;Weights;15 reps   Extension Weight (lbs) 2   Extension Limitations prone I's on green pball   External Rotation Strengthening;Both;15 reps   External Rotation Weight (lbs) 2   External Rotation Limitations "w's" prone on green P-ball    Horizontal ABduction 1 Strengthening;Both;Weights;15 reps   Horizontal ABduction 1 Weight (lbs) 2   Horizontal ABduction 1 Limitations Prone T's on green pball   Horizontal ABduction 2 Strengthening;Both;Weights;15 reps   Horizontal ABduction 2 Weight (lbs) 2   Horizontal ABduction 2 Limitations Prone Y's on green pball   Other Prone Exercises  Quadruped alternating UE/LE raise with 2# on L wrist x 10 reps each side     Shoulder Exercises: Standing   Other Standing Exercises PNF D1/D2 flexion/extension resisted - 4 way  - green TB x 10 each   Other Standing Exercises Wall walk in pushup position with red looped TB around elbows 6 x 10 ft      Shoulder Exercises: ROM/Strengthening   UBE (Upper Arm Bike) L4.0 x 6 min (3/3)   Cybex Row 15 reps  with 3" hold    Cybex Row Limitations 45# - narrow grip   Wall Pushups 15 reps   Pushups Limitations elbows at 45 deg; on green pball - 2 sets   Other ROM/Strengthening Exercises  BATCA pull down - 35# x 15 reps   Other ROM/Strengthening Exercises body blade L shoulder IR/ER x 30 sec; B shoulder flexion 0-90 dg x 30 sec                 PT Education - 10/28/16 1445    Education provided Yes   Education Details Green TB for diagonals    Person(s) Educated Patient   Methods Explanation;Demonstration;Verbal cues;Handout   Comprehension Verbalized understanding;Returned demonstration;Verbal cues required;Need further instruction          PT Short Term Goals - 10/28/16  1446      PT SHORT TERM GOAL #1   Title patient to be independent with HEP (10/26/16)   Status Achieved           PT Long Term Goals - 10/15/16 1022      PT LONG TERM GOAL #1   Title Patient to be independent with advanced HEP (11/16/16)   Status On-going     PT LONG TERM GOAL #2   Title Patient to demonstrate L UE AROM equal to that of R UE without pain limiting (11/16/16)   Status On-going     PT LONG TERM GOAL #3   Title Patient to demonstrate good postural awareness with ability to self correct (11/16/16)   Status On-going     PT LONG TERM GOAL #4   Title Patient to report ability to return to work and leisure activities without pain limiting function (11/16/16)   Status On-going     PT LONG TERM GOAL #5   Title Patient to improve L UE strength to >/= 4+/5 (11/16/16)   Status On-going                Plan - 10/28/16 1405    Clinical Impression Statement Pt. doing well today.  Feels injection to L shoulder is still helping.  Only has pain reaching behind back now.  Quadruped UE/LE raise and body blade added for proximal shoulder/scapular stability today which was tolerated well.  Tolerated mild progression with all strengthening activities well without issue.  HEP updated for increased resistance.       PT Treatment/Interventions ADLs/Self Care Home Management;Cryotherapy;Electrical Stimulation;Iontophoresis 4mg /ml Dexamethasone;Moist Heat;Ultrasound;Neuromuscular re-education;Therapeutic exercise;Therapeutic activities;Functional mobility training;Patient/family education;Manual techniques;Passive range of motion;Vasopneumatic Device;Taping;Dry needling      Patient will benefit from skilled therapeutic intervention in order to improve the following deficits and impairments:  Decreased activity tolerance, Decreased range of motion, Decreased strength, Postural dysfunction, Impaired UE functional use, Pain  Visit Diagnosis: Chronic left shoulder pain  Stiffness of left shoulder, not elsewhere classified  Abnormal posture     Problem List Patient Active Problem List   Diagnosis Date Noted  . Diabetes mellitus type 2 in nonobese (HCC) 06/26/2016  . LIVER FUNCTION TESTS, ABNORMAL 05/21/2006  . ALLERGIC RHINITIS, SEASONAL 02/17/2006  . INTESTINAL ADHESIONS WITH OBSTRUCTION 02/17/2006  . KELOID 02/17/2006  . NEPHRECTOMY, HX OF 02/17/2006    Kermit Balo, PTA 10/28/16 2:47 PM  [facility] Outpatient Rehabilitation [facility] [address]  [address] [place], [place], [address] Phone: [phone]   Fax:  [phone]  Name: Michael Guerra MRN: [id number] Date of Birth: 1961-03-31

## 2016-10-29 ENCOUNTER — Ambulatory Visit: Payer: 59 | Admitting: Physical Therapy

## 2016-11-02 ENCOUNTER — Ambulatory Visit: Payer: 59

## 2016-11-11 MED FILL — ANASTROZOLE 1 MG TABLET: 1 | 30 days supply | Qty: 60 | Fill #2

## 2016-11-16 MED FILL — JANUVIA 100 MG TABLET: 100 | 30 days supply | Qty: 30 | Fill #3

## 2016-11-26 ENCOUNTER — Ambulatory Visit: Payer: 59 | Attending: Family Medicine | Admitting: Physical Therapy

## 2016-11-26 DIAGNOSIS — R293 Abnormal posture: Secondary | ICD-10-CM | POA: Diagnosis not present

## 2016-11-26 DIAGNOSIS — M25512 Pain in left shoulder: Secondary | ICD-10-CM | POA: Insufficient documentation

## 2016-11-26 DIAGNOSIS — M25612 Stiffness of left shoulder, not elsewhere classified: Secondary | ICD-10-CM

## 2016-11-26 DIAGNOSIS — G8929 Other chronic pain: Secondary | ICD-10-CM

## 2016-11-26 NOTE — Therapy (Signed)
[facility] Outpatient [facility] [address]  [address] [place], [place], [address] Phone: [phone]   Fax:  [phone]  Physical Therapy Treatment  Patient Details  Name: Michael Guerra MRN: [id number] Date of Birth: 1960-08-15 Referring Provider: Dr. Arva Chafe  Encounter Date: 11/26/2016      PT End of Session - 11/26/16 0850    Visit Number 8   Number of Visits 14   Date for PT Re-Evaluation 12/24/16   PT Start Time 0850   PT Stop Time 0930   PT Time Calculation (min) 40 min   Activity Tolerance Patient tolerated treatment well   Behavior During Therapy [facility] for tasks assessed/performed      Past Medical History:  Diagnosis Date  . Diabetes mellitus   . Hyperlipidemia   . Hypertension   . Solitary kidney     Past Surgical History:  Procedure Laterality Date  . ABDOMINAL SURGERY    . KIDNEY SURGERY      There were no vitals filed for this visit.      Subjective Assessment - 11/26/16 0848    Subjective feeling really well - only has pain with reaching behind back   Pertinent History DM - controlled   Patient Stated Goals reduce pain   Currently in Pain? Yes   Pain Location Scapula   Pain Orientation Left   Pain Descriptors / Indicators Pressure                         [facility] Adult PT Treatment/Exercise - 11/26/16 0918      Shoulder Exercises: ROM/Strengthening   UBE (Upper Arm Bike) L3 x 5 min    Cybex Row 15 reps   Cybex Row Limitations 25# - narrow grip   Wall Pushups 15 reps   Pushups Limitations orange pball on wall + plus     Shoulder Exercises: Stretch   Corner Stretch 3 reps;30 seconds   Corner Stretch Limitations doorway - low and mid only     Manual Therapy   Manual Therapy Soft tissue mobilization   Manual therapy comments patient prone   Soft tissue mobilization STM to scapular stabilizers - trigger points noted throughout          Trigger Point Dry Needling - 11/26/16  6962    Consent Given? Yes   Education Handout Provided Yes   Muscles Treated Upper Body Infraspinatus  L only   Infraspinatus Response Twitch response elicited;Palpable increased muscle length                PT Short Term Goals - 10/28/16 1446      PT SHORT TERM GOAL #1   Title patient to be independent with HEP (10/26/16)   Status Achieved           PT Long Term Goals - 11/26/16 9528      PT LONG TERM GOAL #1   Title Patient to be independent with advanced HEP (11/16/16)   Status On-going     PT LONG TERM GOAL #2   Title Patient to demonstrate L UE AROM equal to that of R UE without pain limiting (11/16/16)   Status On-going     PT LONG TERM GOAL #3   Title Patient to demonstrate good postural awareness with ability to self correct (11/16/16)   Status Achieved     PT LONG TERM GOAL #4   Title Patient to report ability to return to  work and leisure activities without pain limiting function (11/16/16)   Status Achieved     PT LONG TERM GOAL #5   Title Patient to improve L UE strength to >/= 4+/5 (11/16/16)   Status On-going               Plan - 11/26/16 0851    Clinical Impression Statement Aleksi has noticed great improvement in L shoulder function and pain since both PT as well as injection of L shoulder. Patient today with primary concerns of inability to reach behind back without onset of pain. Palpation revealing trigger points throughout infrapsinatus today with reproduction of pain and "pressure" symptoms. Discussion with patient regarding DN treatment with patient and PT agreeing upon this. Will plan to extend POC today for short time to assess benefit from PT and progress functional ROM behind back.    PT Treatment/Interventions ADLs/Self Care Home Management;Cryotherapy;Electrical Stimulation;Iontophoresis 4mg /ml Dexamethasone;Moist Heat;Ultrasound;Neuromuscular re-education;Therapeutic exercise;Therapeutic activities;Functional mobility  training;Patient/family education;Manual techniques;Passive range of motion;Vasopneumatic Device;Taping;Dry needling   Consulted and Agree with Plan of Care Patient      Patient will benefit from skilled therapeutic intervention in order to improve the following deficits and impairments:  Decreased activity tolerance, Decreased range of motion, Decreased strength, Postural dysfunction, Impaired UE functional use, Pain  Visit Diagnosis: Chronic left shoulder pain - Plan: PT plan of care cert/re-cert  Stiffness of left shoulder, not elsewhere classified - Plan: PT plan of care cert/re-cert  Abnormal posture - Plan: PT plan of care cert/re-cert     Problem List Patient Active Problem List   Diagnosis Date Noted  . Diabetes mellitus type 2 in nonobese (HCC) 06/26/2016  . LIVER FUNCTION TESTS, ABNORMAL 05/21/2006  . ALLERGIC RHINITIS, SEASONAL 02/17/2006  . INTESTINAL ADHESIONS WITH OBSTRUCTION 02/17/2006  . KELOID 02/17/2006  . NEPHRECTOMY, HX OF 02/17/2006     Michael Guerra, PT, DPT 11/26/16 10:04 AM   [facility] [address]  [address] [place], [place], [address] Phone: [phone]   Fax:  [phone]  Name: Michael Guerra MRN: [id number] Date of Birth: 1961/03/01

## 2016-11-26 NOTE — Patient Instructions (Signed)

## 2016-11-27 ENCOUNTER — Ambulatory Visit: Payer: 59

## 2016-11-27 DIAGNOSIS — G8929 Other chronic pain: Secondary | ICD-10-CM

## 2016-11-27 DIAGNOSIS — M25612 Stiffness of left shoulder, not elsewhere classified: Secondary | ICD-10-CM | POA: Diagnosis not present

## 2016-11-27 DIAGNOSIS — M25512 Pain in left shoulder: Secondary | ICD-10-CM | POA: Diagnosis not present

## 2016-11-27 DIAGNOSIS — R293 Abnormal posture: Secondary | ICD-10-CM

## 2016-11-27 NOTE — Therapy (Addendum)
[facility] [address]  [place], [place], [address] Phone: [phone]   Fax:  [phone]  Physical Therapy Treatment  Patient Details  Name: Michael Guerra MRN: [id number] Date of Birth: 20-Dec-1960 Referring Provider: Dr. Riki Sheer  Encounter Date: 11/27/2016      PT End of Session - 11/27/16 0851    Visit Number 9   Number of Visits 14   Date for PT Re-Evaluation 12/24/16   PT Start Time 0847   PT Stop Time 0940   PT Time Calculation (min) 53 min   Activity Tolerance Patient tolerated treatment well   Behavior During Therapy [facility] for tasks assessed/performed      Past Medical History:  Diagnosis Date  . Diabetes mellitus   . Hyperlipidemia   . Hypertension   . Solitary kidney     Past Surgical History:  Procedure Laterality Date  . ABDOMINAL SURGERY    . KIDNEY SURGERY      There were no vitals filed for this visit.      Subjective Assessment - 11/27/16 0851    Subjective Notes feeling good after DN to L shoulder.     Patient Stated Goals reduce pain   Currently in Pain? No/denies   Pain Score 0-No pain   Multiple Pain Sites No                         [facility] Adult PT Treatment/Exercise - 11/27/16 0903      Shoulder Exercises: Standing   External Rotation Left;15 reps   Theraband Level (Shoulder External Rotation) Level 3 (Green)   Row 15 reps;Both   Row Limitations TRX   Other Standing Exercises TRX W-row (B ER) x 15 reps; light resistance   Other Standing Exercises Elevated W (B ER) row with yellow TB 3" x 15 reps     Shoulder Exercises: ROM/Strengthening   UBE (Upper Arm Bike) NuStep: lvl 5, 6 min (UE/LE)     Shoulder Exercises: Stretch   Corner Stretch 3 reps;30 seconds   Corner Stretch Limitations doorway - low and mid only   Other Shoulder Stretches Standing L IR stretch with golf club 3 x 30 sec    Other Shoulder Stretches R sidelying L IR sleeper  stretch 3 x 30 sec      Modalities   Modalities Electrical Stimulation;Moist Heat     Moist Heat Therapy   Number Minutes Moist Heat 10 Minutes   Moist Heat Location Shoulder     Electrical Stimulation   Electrical Stimulation Location L upper back   Electrical Stimulation Action IFC   Electrical Stimulation Parameters 80-'150Hz' , intensity to pt. tolerance, 10'    Electrical Stimulation Goals Pain;Tone  "soreness/tension"     Manual Therapy   Manual Therapy Soft tissue mobilization   Manual therapy comments R sidelying    Soft tissue mobilization STM to infraspinatus                PT Education - 11/27/16 1252    Education provided Yes   Education Details Sleeper stretch, towel IR stretch    Person(s) Educated Patient   Methods Demonstration;Verbal cues;Handout   Comprehension Verbalized understanding;Returned demonstration;Verbal cues required;Need further instruction          PT Short Term Goals - 10/28/16 1446      PT SHORT TERM GOAL #1   Title patient to be independent with HEP (10/26/16)  Status Achieved           PT Long Term Goals - 11/26/16 9407      PT LONG TERM GOAL #1   Title Patient to be independent with advanced HEP (11/16/16)   Status On-going     PT LONG TERM GOAL #2   Title Patient to demonstrate L UE AROM equal to that of R UE without pain limiting (11/16/16)   Status On-going     PT LONG TERM GOAL #3   Title Patient to demonstrate good postural awareness with ability to self correct (11/16/16)   Status Achieved     PT LONG TERM GOAL #4   Title Patient to report ability to return to work and leisure activities without pain limiting function (11/16/16)   Status Achieved     PT LONG TERM GOAL #5   Title Patient to improve L UE strength to >/= 4+/5 (11/16/16)   Status On-going               Plan - 11/27/16 0930    Clinical Impression Statement [name] doing well today notes feeling good after DN.  Treatment focused on  scapular/RTC strengthening and L shoulder IR stretching to improve functional range and tolerance.  HEP updated with IR stretching with pt. able to perform well without pain.  Pt. progressing well.     PT Treatment/Interventions ADLs/Self Care Home Management;Cryotherapy;Electrical Stimulation;Iontophoresis 56m/ml Dexamethasone;Moist Heat;Ultrasound;Neuromuscular re-education;Therapeutic exercise;Therapeutic activities;Functional mobility training;Patient/family education;Manual techniques;Passive range of motion;Vasopneumatic Device;Taping;Dry needling      Patient will benefit from skilled therapeutic intervention in order to improve the following deficits and impairments:  Decreased activity tolerance, Decreased range of motion, Decreased strength, Postural dysfunction, Impaired UE functional use, Pain  Visit Diagnosis: Chronic left shoulder pain  Stiffness of left shoulder, not elsewhere classified  Abnormal posture     Problem List Patient Active Problem List   Diagnosis Date Noted  . Diabetes mellitus type 2 in nonobese ([place] 06/26/2016  . LIVER FUNCTION TESTS, ABNORMAL 05/21/2006  . ALLERGIC RHINITIS, SEASONAL 02/17/2006  . INTESTINAL ADHESIONS WITH OBSTRUCTION 02/17/2006  . KELOID 02/17/2006  . NEPHRECTOMY, HX OF 02/17/2006    MBess Harvest PTA 11/27/16 12:53 PM   PHYSICAL THERAPY DISCHARGE SUMMARY  Visits from Start of Care: 9  Current functional level related to goals / functional outcomes: See above   Remaining deficits: See above   Education / Equipment: HEP  Plan: Patient agrees to discharge.  Patient goals were not met. Patient is being discharged due to not returning since the last visit.  ?????     SLanney Gins PT, DPT 12/25/16 8:36 AM  [facility] [place] [address]Phone: [phone]  Fax:  [phone] Name: Michael LOTTMRN: [id number] of  Birth: [birth year]

## 2016-12-01 DIAGNOSIS — E291 Testicular hypofunction: Secondary | ICD-10-CM | POA: Diagnosis not present

## 2016-12-02 MED FILL — metFORMIN HCL 1000 MG TABS: 1000 | 90 days supply | Qty: 180 | Fill #2

## 2016-12-04 MED FILL — CLOMIPHENE CITRATE 50 MG TA: 50 | 30 days supply | Qty: 30 | Fill #3

## 2016-12-10 DIAGNOSIS — N469 Male infertility, unspecified: Secondary | ICD-10-CM | POA: Diagnosis not present

## 2016-12-10 DIAGNOSIS — E291 Testicular hypofunction: Secondary | ICD-10-CM | POA: Diagnosis not present

## 2016-12-14 DIAGNOSIS — E291 Testicular hypofunction: Secondary | ICD-10-CM | POA: Diagnosis not present

## 2016-12-15 MED FILL — ANASTROZOLE 1 MG TABLET: 1 | 30 days supply | Qty: 60 | Fill #3

## 2016-12-23 MED FILL — JANUVIA 100 MG TABLET: 100 | 30 days supply | Qty: 30 | Fill #4

## 2016-12-23 MED FILL — PRAVASTATIN NA 40 MG TAB: 40 | 90 days supply | Qty: 90 | Fill #1

## 2016-12-31 DIAGNOSIS — N4611 Organic oligospermia: Secondary | ICD-10-CM | POA: Diagnosis not present

## 2016-12-31 DIAGNOSIS — E291 Testicular hypofunction: Secondary | ICD-10-CM | POA: Diagnosis not present

## 2017-01-05 MED FILL — CLOMIPHENE CITRATE 50 MG TA: 50 | 30 days supply | Qty: 30 | Fill #4

## 2017-01-18 MED FILL — JANUVIA 100 MG TABLET: 100 | 30 days supply | Qty: 30 | Fill #5

## 2017-01-18 MED FILL — ANASTROZOLE 1 MG TABLET: 1 | 30 days supply | Qty: 60 | Fill #4

## 2017-01-19 ENCOUNTER — Telehealth: Payer: Self-pay | Admitting: Family Medicine

## 2017-01-19 ENCOUNTER — Other Ambulatory Visit: Payer: Self-pay | Admitting: Family Medicine

## 2017-01-19 DIAGNOSIS — R911 Solitary pulmonary nodule: Secondary | ICD-10-CM

## 2017-01-19 DIAGNOSIS — R918 Other nonspecific abnormal finding of lung field: Secondary | ICD-10-CM

## 2017-01-19 NOTE — Progress Notes (Signed)
Let pt know we have placed the order for the CT chest to follow up on the incidental lung nodule from the beginning of Mar. Expect call over coming weeks. TY.

## 2017-01-19 NOTE — Telephone Encounter (Signed)
Pt spouse called [phone] stating pt works 12 hr shift and is unable to call for himself. He received a message from imaging stating [facility] ordered a Chest CT. Pt has not been seen since July and has no chest symptoms. Please advise.

## 2017-01-19 NOTE — Telephone Encounter (Signed)
Pt wife aware that this was ordered as a F/U from 51mm nodule in RUL from findings on 3.3.18/confirmed that will be there for appt/thx dmf

## 2017-01-21 ENCOUNTER — Ambulatory Visit (HOSPITAL_BASED_OUTPATIENT_CLINIC_OR_DEPARTMENT_OTHER)
Admission: RE | Admit: 2017-01-21 | Discharge: 2017-01-21 | Disposition: A | Discharge status: Home / Self Care | Payer: 59 | Source: Ambulatory Visit | Attending: Family Medicine | Admitting: Family Medicine

## 2017-01-21 DIAGNOSIS — R918 Other nonspecific abnormal finding of lung field: Secondary | ICD-10-CM | POA: Diagnosis not present

## 2017-01-21 DIAGNOSIS — R911 Solitary pulmonary nodule: Secondary | ICD-10-CM

## 2017-01-22 ENCOUNTER — Other Ambulatory Visit (HOSPITAL_BASED_OUTPATIENT_CLINIC_OR_DEPARTMENT_OTHER): Payer: 59

## 2017-01-25 ENCOUNTER — Other Ambulatory Visit: Payer: Self-pay | Admitting: Family Medicine

## 2017-01-25 DIAGNOSIS — R911 Solitary pulmonary nodule: Secondary | ICD-10-CM

## 2017-02-08 MED FILL — CLOMIPHENE CITRATE 50 MG TA: 50 | 30 days supply | Qty: 30 | Fill #5

## 2017-02-11 MED FILL — ANASTROZOLE 1 MG TABLET: 1 | 30 days supply | Qty: 60 | Fill #5

## 2017-02-24 ENCOUNTER — Other Ambulatory Visit: Payer: Self-pay | Admitting: Family Medicine

## 2017-02-24 DIAGNOSIS — E119 Type 2 diabetes mellitus without complications: Secondary | ICD-10-CM

## 2017-02-24 MED FILL — JANUVIA 100 MG TABLET: 100 | 30 days supply | Qty: 30 | Fill #0

## 2017-03-19 ENCOUNTER — Other Ambulatory Visit: Payer: Self-pay | Admitting: Family Medicine

## 2017-03-19 DIAGNOSIS — IMO0001 Reserved for inherently not codable concepts without codable children: Secondary | ICD-10-CM

## 2017-03-19 DIAGNOSIS — E1165 Type 2 diabetes mellitus with hyperglycemia: Principal | ICD-10-CM

## 2017-03-19 MED FILL — ANASTROZOLE 1 MG TABLET: 1 | 30 days supply | Qty: 60 | Fill #6

## 2017-03-19 MED FILL — metFORMIN HCL 1000 MG TABS: 1000 | 90 days supply | Qty: 180 | Fill #0

## 2017-03-19 MED FILL — CLOMIPHENE CITRATE 50 MG TA: 50 | 30 days supply | Qty: 30 | Fill #6

## 2017-04-07 MED FILL — JANUVIA 100 MG TABLET: 100 | 30 days supply | Qty: 30 | Fill #1

## 2017-04-28 MED FILL — CLOMIPHENE CITRATE 50 MG TA: 50 | 30 days supply | Qty: 30 | Fill #7

## 2017-05-04 DIAGNOSIS — H5213 Myopia, bilateral: Secondary | ICD-10-CM | POA: Diagnosis not present

## 2017-05-04 LAB — HM DIABETES EYE EXAM

## 2017-06-30 ENCOUNTER — Encounter: Payer: Self-pay | Admitting: Family Medicine

## 2017-06-30 ENCOUNTER — Ambulatory Visit: Payer: 59 | Admitting: Family Medicine

## 2017-06-30 VITALS — BP 108/78 | HR 83 | Temp 98.4°F | Ht 73.0 in | Wt 197.2 lb

## 2017-06-30 DIAGNOSIS — E119 Type 2 diabetes mellitus without complications: Secondary | ICD-10-CM | POA: Diagnosis not present

## 2017-06-30 DIAGNOSIS — M7711 Lateral epicondylitis, right elbow: Secondary | ICD-10-CM

## 2017-06-30 LAB — MICROALBUMIN / CREATININE URINE RATIO
Creatinine,U: 75 mg/dL
Microalb Creat Ratio: 0.9 mg/g (ref 0.0–30.0)

## 2017-06-30 LAB — HEMOGLOBIN A1C: HEMOGLOBIN A1C: 6.9 % — AB (ref 4.6–6.5)

## 2017-06-30 NOTE — Progress Notes (Signed)
Pre visit review using our clinic review tool, if applicable. No additional management support is needed unless otherwise documented below in the visit note. 

## 2017-06-30 NOTE — Progress Notes (Signed)
Subjective:   Chief Complaint  Patient presents with  . Diabetes    Michael Guerra is a 57 y.o. male here for follow-up of diabetes.   Mehdi's self monitored glucose range is low 100's.  Patient denies hypoglycemic reactions. He checks his glucose levels 1 times per day. Patient does not require insulin.   Medications include: none, stopped Metformin and Januvia Patient exercises 5 days per week on average.   Statin? Yes- has not been taking  R tennis elbow for past few weeks after lifting weights. No numbness or tingling .  Past Medical History:  Diagnosis Date  . Diabetes mellitus   . Hyperlipidemia   . Hypertension   . Solitary kidney     Related testing: Foot exam(monofilament and inspection):done Retinal exam:done Date of retinal exam: 03/2017  Done by:  Dr. Cathey Endow Pneumovax: done Flu Shot: done  Review of Systems: Eye:  No recent significant change in vision Pulmonary:  No SOB Cardiovascular:  No chest pain, no palpitations Skin/Integumentary ROS:  No abnormal skin lesions reported Neurologic:  No numbness, tingling  Objective:  BP 108/78 (BP Location: Left Arm, Patient Position: Sitting, Cuff Size: Large)   Pulse 83   Temp 98.4 F (36.9 C) (Oral)   Ht 6\' 1"  (1.854 m)   Wt 197 lb 4 oz (89.5 kg)   SpO2 98%   BMI 26.02 kg/m  General:  Well developed, well nourished, in no apparent distress Skin:  Warm, no pallor or diaphoresis Head:  Normocephalic, atraumatic Eyes:  Pupils equal and round, sclera anicteric without injection  Nose:  External nares without trauma, no discharge Throat/Pharynx:  Lips and gingiva without lesion Neck: Neck supple.  No obvious thyromegaly or masses.  No bruits Lungs:  clear to auscultation, breath sounds equal bilaterally, no wheezes, rales, or stridor Cardio:  regular rate and rhythm without murmurs, no bruits, no LE edema Musculoskeletal:  +TTP over R lateral epicond, +TTP with resisted wrist extension on R Psych: Age  appropriate judgment and insight  Assessment:   Diabetes mellitus type 2 in nonobese (HCC) - Plan: Hemoglobin A1c, Microalbumin / creatinine urine ratio  Lateral epicondylitis of right elbow   Plan:   Orders as above. Encouraged on progress with weight loss. OK to hold off on meds for now. I would like him to stay on the statin.  Band-IT forearm strap, home stretches/exercises, ice.  F/u in 3 mo. The patient voiced understanding and agreement to the plan.  Michael Guerra [place], DO 06/30/17 11:06 AM

## 2017-06-30 NOTE — Patient Instructions (Addendum)
Keep up the great work!  Band-IT forearm strap.  Keep checking sugars. OK to stay off of diabetes medicines. Stay on pravastatin.   Come to your next appointment fasting.   Let us know if you need anything.   Elbow and Forearm Exercises It is normal to feel mild stretching, pulling, tightness, or discomfort as you do these exercises, but you should stop right away if you feel sudden pain or your pain gets worse. RANGE OF MOTION EXERCISES These exercises warm up your muscles and joints and improve the movement and flexibility of your injured elbow and forearm. These exercises also help to relieve pain, numbness, and tingling.These exercises are done using the muscles in your injured elbow and forearm. Exercise A: Elbow Flexion, Active 1. Hold your left / right arm at your side, and bend your elbow as far as you can using your left / right arm muscles. 2. Hold this position for 30 seconds. 3. Slowly return to the starting position. Repeat 2 times. Complete this exercise 3 times per week. Exercise B: Elbow Extension, Active 1. Hold your left / right arm at your side, and straighten your elbow as much as you can using your left / right arm muscles. 2. Hold this position for 30 seconds. 3. Slowly return to the starting position. Repeat 2 times. Complete this exercise 3 times per week. Exercise C: Forearm Rotation, Supination, Active 1. Stand or sit with your elbows at your sides. 2. Bend your left / right elbow to an "L" shape (90 degrees). 3. Turn your palm upward until you feel a gentle stretch on the inside of your forearm. 4. Hold this position for 30 seconds. 5. Slowly release and return to the starting position. Repeat 2 times. Complete this exercise 3 times per week. Exercise D: Forearm Rotation, Pronation, Active 1. Stand or sit with your elbows at your side. 2. Bend your left / right elbow to an "L" shape (90 degrees). 3. Turn your left / right palm downward until you feel a  gentle stretch on the top of your forearm. 4. Hold this position for 30 seconds. 5. Slowly release and return to the starting position. Repeat2 times. Complete this exercise 3 times per week. STRETCHING EXERCISES These exercises warm up your muscles and joints and improve the movement and flexibility of your injured elbow and forearm. These exercises also help to relieve pain, numbness, and tingling.These exercises are done using your healthy elbow and forearm to help stretch the muscles in your injured elbow and forearm. Exercise E: Elbow Flexion, Active-Assisted  1. Hold your left / right arm at your side, and bend your elbow as much as you can using your left / right arm muscles. 2. Use your other hand to bend your left / right elbow farther. To do this, gently push up on your forearm until you feel a gentle stretch on the back of your elbow. 3. Hold this position for 30 seconds. 4. Slowly return to the starting position. Repeat 2 times. Complete this exercise 3 times per week. Exercise F: Elbow Extension, Active-Assisted  1. Hold your left / right arm at your side, and straighten your elbow as much as you can using your left / right arm muscles. 2. Use your other hand to straighten the left / right elbow farther. To do this, gently push down on your forearm until you feel a gentle stretch on the inside of your elbow. 3. Hold this position for 30 seconds. 4. Slowly return to  the starting position. Repeat 2 times. Complete this exercise 3 times per weeky. Exercise G: Forearm Rotation, Supination, Active-Assisted  1. Sit with your left / right elbow bent in an "L" shape (90 degrees) with your forearm resting on a table. 2. Keeping your upper body and shoulder still, rotate your forearm so your left / right palm faces upward. 3. Use your other hand to help rotate your forearm further until you feel a gentle to moderate stretch. 4. Hold this position for 30 seconds. 5. Slowly release the  stretch and return to the starting position. Repeat 2 times. Complete this exercise 3 times per week. Exercise H: Forearm Rotation, Pronation, Active-Assisted  1. Sit with your left / right elbow bent in an "L" shape (90 degrees) with your forearm resting on a table. 2. Keeping your upper body and shoulder still, rotate your forearm so your palm faces the tabletop. 3. Use your other hand to help rotate your forearm further until you feel a gentle to moderate stretch. 4. Hold this position for 30 seconds. 5. Slowly release the stretch and return to the starting position. Repeat 2 times. Complete this exercise 3 times per week. Exercise I: Elbow Flexion, Supine, Passive 1. Lie on your back. 2. Extend your left / right arm up in the air, bracing it with your other hand. 3. Let your left / right your hand slowly lower toward your shoulder, while your elbow stays pointed toward the ceiling. You should feel a gentle stretch along the back of your upper arm and elbow. 4. If instructed by your health care provider, you may increase the intensity of your stretch by adding a small wrist weight or hand weight. 5. Hold this position for 3 seconds. 6. Slowly return to the starting position. Repeat 2 times. Complete this exercise 3 times per week. Exercise J: Elbow Extension, Supine, Passive  1. Lie on your back. Make sure that you are in a comfortable position that lets you relax your arm muscles. 2. Place a folded towel under your left / right upper arm so your elbow and shoulder are at the same height. Straighten your left / right arm so your elbow does not rest on the bed or towel. 3. Let the weight of your hand stretch your elbow. Keep your arm and chest muscles relaxed. You should feel a stretch on the inside of your elbow. 4. If told by your health care provider, you may increase the intensity of your stretch by adding a small wrist weight or hand weight. 5. Hold this position for 30  seconds. 6. Slowly release the stretch. Repeat 2 times. Complete this exercise 3 times per week. STRENGTHENING EXERCISES These exercises build strength and endurance in your elbow and forearm. Endurance is the ability to use your muscles for a long time, even after they get tired. Exercise K: Elbow Flexion, Isometric  1. Stand or sit up straight. 2. Bend your left / right elbow in an "L" shape (90 degrees) and turn your palm up so your forearm is at the height of your waist. 3. Place your other hand on top of your forearm. Gently push down as your left / right arm resists. Push as hard as you can with both arms without causing any pain or movement at your left / right elbow. 4. Hold this position for 3 seconds. 5. Slowly release the tension in both arms. Let your muscles relax completely before repeating. Repeat 2 times. Complete this exercise 3 times per  week. Exercise L: Elbow Extensors, Isometric  1. Stand or sit up straight. 2. Place your left / right arm so your palm faces your abdomen and it is at the height of your waist. 3. Place your other hand on the underside of your forearm. Gently push up as your left / right arm resists. Push as hard as you can with both arms, without causing any pain or movement at your left / right elbow. 4. Hold this position for 3 seconds. 5. Slowly release the tension in both arms. Let your muscles relax completely before repeating. Repeat _______2___ times. Complete this exercise 3 times per week. Exercise M: Elbow Flexion With Forearm Palm Up  1. Sit upright on a firm chair without armrests, or stand. 2. Place your left / right arm at your side with your palm facing forward. 3. Holding a 5 lbweight or gripping a rubber exercise band or tubing, bend your elbow to bring your hand toward your shoulder. 4. Hold this position for 3 seconds. 5. Slowly return to the starting position. Repeat 2 times. Complete this exercise 3 times per week. Exercise N:  Elbow Extension  1. Sit on a firm chair without armrests, or stand. 2. Keeping your upper arms at your sides, bring both hands up toward your left / right shoulder while you grip a rubber exercise band or tubing. Your left / right hand should be just below the other hand. 3. Straighten your left / right elbow. 4. Hold this position for 3 seconds. 5. Control the resistance of the band or tubing as your hand returns to your side. Repeat 2 times. Complete this exercise 3 times per week. Exercise O: Forearm Rotation, Supination  1. Sit with your left / right forearm supported on a table. Keep your elbow at waist height. 2. Rest your hand over the edge of the table with your palm facing down. 3. Gently hold a lightweight hammer. 4. Without moving your elbow, slowly rotate your forearm to turn your palm and hand upward to a "thumbs-up" position. 5. Hold this position for 3 seconds. 6. Slowly return to the starting position. Repeat 2 times. Complete this exercise 3 times per week. Exercise P: Forearm Rotation, Pronation  1. Sit with your left / right forearm supported on a table. Keep your elbow below shoulder height. 2. Rest your hand over the edge of the table with your palm facing up. 3. Gently hold a lightweight hammer. 4. Without moving your elbow, slowly rotate your forearm to turn your palm and hand upward to a "thumbs-up" position. 5. Hold this position for 3 seconds. 6. Slowly return to the starting position. Repeat 2 times. Complete this exercise 3 times per week.  Make sure you discuss any questions you have with your health care provider. Document Released: 02/18/2005 Document Revised: 08/15/2015 Document Reviewed: 12/30/2014 Elsevier Interactive Patient Education  [employer].

## 2017-08-04 DIAGNOSIS — Z3184 Encounter for fertility preservation procedure: Secondary | ICD-10-CM | POA: Diagnosis not present

## 2017-08-04 DIAGNOSIS — Z113 Encounter for screening for infections with a predominantly sexual mode of transmission: Secondary | ICD-10-CM | POA: Diagnosis not present

## 2017-08-04 DIAGNOSIS — Z3141 Encounter for fertility testing: Secondary | ICD-10-CM | POA: Diagnosis not present

## 2017-08-09 DIAGNOSIS — Z3141 Encounter for fertility testing: Secondary | ICD-10-CM | POA: Diagnosis not present

## 2017-08-09 DIAGNOSIS — Z3184 Encounter for fertility preservation procedure: Secondary | ICD-10-CM | POA: Diagnosis not present

## 2017-08-09 MED FILL — ANASTROZOLE 1 MG TABLET: 1 | 30 days supply | Qty: 60 | Fill #7

## 2017-08-09 MED FILL — CLOMIPHENE CITRATE 50 MG TA: 50 | 25 days supply | Qty: 25 | Fill #8

## 2017-08-13 DIAGNOSIS — Z3141 Encounter for fertility testing: Secondary | ICD-10-CM | POA: Diagnosis not present

## 2017-08-13 DIAGNOSIS — Z3184 Encounter for fertility preservation procedure: Secondary | ICD-10-CM | POA: Diagnosis not present

## 2017-08-18 DIAGNOSIS — Z3184 Encounter for fertility preservation procedure: Secondary | ICD-10-CM | POA: Diagnosis not present

## 2017-08-18 DIAGNOSIS — Z3141 Encounter for fertility testing: Secondary | ICD-10-CM | POA: Diagnosis not present

## 2017-08-21 DIAGNOSIS — Z3189 Encounter for other procreative management: Secondary | ICD-10-CM | POA: Diagnosis not present

## 2017-09-16 ENCOUNTER — Emergency Department
Admission: EM | Admit: 2017-09-16 | Discharge: 2017-09-16 | Disposition: A | Discharge status: Home / Self Care | Payer: 59 | Source: Home / Self Care

## 2017-09-16 ENCOUNTER — Other Ambulatory Visit: Payer: Self-pay

## 2017-09-16 ENCOUNTER — Emergency Department (INDEPENDENT_AMBULATORY_CARE_PROVIDER_SITE_OTHER): Payer: 59

## 2017-09-16 DIAGNOSIS — R1013 Epigastric pain: Secondary | ICD-10-CM | POA: Diagnosis not present

## 2017-09-16 DIAGNOSIS — K5901 Slow transit constipation: Secondary | ICD-10-CM | POA: Diagnosis not present

## 2017-09-16 DIAGNOSIS — R109 Unspecified abdominal pain: Secondary | ICD-10-CM

## 2017-09-16 DIAGNOSIS — E291 Testicular hypofunction: Secondary | ICD-10-CM | POA: Diagnosis not present

## 2017-09-16 LAB — POCT CBC W AUTO DIFF (K'VILLE URGENT CARE)

## 2017-09-16 MED ORDER — POLYETHYLENE GLYCOL 3350 17 GM/SCOOP PO POWD: 1.0000 | Freq: Once | ORAL | 0 refills | Status: AC

## 2017-09-16 NOTE — ED Triage Notes (Signed)
Pt was working out doing sit ups Tuesday night, and had an abdominal cramp.  Last night the pain was severe.  Very sore and painful today.

## 2017-09-16 NOTE — Discharge Instructions (Addendum)
Stay on liquid diet until you have a bowel movement or the abdominal pain is resolved.  Take the MiraLAX (1 Full in 8 ounces of water) at least  twice a day until you have a bowel movement.  If your pain worsens, he will need to go to the emergency room for further evaluation.

## 2017-09-16 NOTE — ED Provider Notes (Signed)
[facility]   [id number] 09/16/17 Arrival Time: 1645   SUBJECTIVE:  Michael Guerra is a 57 y.o. male who presents to the urgent care with complaint of diffuse upper abdominal pain that began 2 days ago after doing a set of sit ups.  After doing the exercises on Tuesday, patient started having waves of pain, which is only gotten worse over the last 24 hours. His last bowel movement was yesterday and he passed some gas this morning.  Patient has had very little nausea and has had no vomiting. He says the pain is worse with any movement.  He has not had a significant appetite in over 24 hours.  Patient has a history of bowel obstruction .   Patient notes that his diabetes has been well-controlled, with blood sugars generally well below 150.  Past Medical History:  Diagnosis Date  . Diabetes mellitus   . Hyperlipidemia   . Hypertension   . Solitary kidney    Family History  Problem Relation Age of Onset  . Cancer Neg Hx    Social History   Socioeconomic History  . Marital status: Married    Spouse name: Not on file  . Number of children: Not on file  . Years of education: Not on file  . Highest education level: Not on file  Occupational History  . Not on file  Social Needs  . Financial resource strain: Not on file  . Food insecurity:    Worry: Not on file    Inability: Not on file  . Transportation needs:    Medical: Not on file    Non-medical: Not on file  Tobacco Use  . Smoking status: Never Smoker  . Smokeless tobacco: Never Used  Substance and Sexual Activity  . Alcohol use: No  . Drug use: No  . Sexual activity: Not on file  Lifestyle  . Physical activity:    Days per week: Not on file    Minutes per session: Not on file  . Stress: Not on file  Relationships  . Social connections:    Talks on phone: Not on file    Gets together: Not on file    Attends religious service: Not on file    Active member of club or organization: Not on file   Attends meetings of clubs or organizations: Not on file    Relationship status: Not on file  . Intimate partner violence:    Fear of current or ex partner: Not on file    Emotionally abused: Not on file    Physically abused: Not on file    Forced sexual activity: Not on file  Other Topics Concern  . Not on file  Social History Narrative  . Not on file   No outpatient medications have been marked as taking for the 09/16/17 encounter [facility] Encounter).   Allergies  Allergen Reactions  . Lisinopril Cough      ROS: As per HPI, remainder of ROS negative.   OBJECTIVE:   Vitals:   09/16/17 1718 09/16/17 1719  BP: (!) 144/88   Pulse: 84   Temp: 98.3 F (36.8 C)   TempSrc: Oral   SpO2: 96%   Weight:  198 lb (89.8 kg)  Height:  6\' 1"  (1.854 m)     General appearance: alert; no distress Eyes: PERRL; EOMI; conjunctiva normal HENT: normocephalic; atraumatic;  oral mucosa normal Neck: supple;no adenopathy Lungs: clear to auscultation bilaterally Heart: regular rate and rhythm Abdomen: soft, diffusely tender;  bowel sounds normal; no masses or organomegaly; and there is guarding and rebound tenderness.  There are 2  Parallel old surgical scars, one in the midline and one to the left of the midline. Patient is mildly bloated. Back: no CVA tenderness Extremities: no cyanosis or edema; symmetrical with no gross deformities Skin: warm and dry Neurologic: normal gait; grossly normal Psychological: alert and cooperative; normal mood and affect      Labs:  Results for orders placed or performed during the hospital encounter of 09/16/17  POCT CBC w auto diff  Result Value Ref Range   WBC  4.5 - 10.5 K/uL   Lymphocytes relative %  15 - 45 %   Monocytes relative %  2 - 10 %   Neutrophils relative % (GR)  44 - 76 %   Lymphocytes absolute  0.1 - 1.8 K/uL   Monocyes absolute  0.1 - 1 K/uL   Neutrophils absolute (GR#)  1.7 - 7.7 K/uL   RBC  4.2 - 5.8 MIL/uL   Hemoglobin  13 -  17 g/dL   Hematocrit  16.1 - 51 %   MCV  80 - 98 fL   MCH  26.5 - 32.5 pg   MCHC  32.5 - 36.9 g/dL   RDW  09.6 - 14 %   Platelet count  140 - 400 K/uL   MPV  7.8 - 11 fL    CBC   WBC 7200  Hgb 15.3  Granulocytes: 76.5%   ASSESSMENT & PLAN:  1. Epigastric pain   2. Slow transit constipation   Stay on liquid diet until you have a bowel movement or the abdominal pain is resolved.  Take the MiraLAX (1 Full in 8 ounces of water) at least  twice a day until you have a bowel movement.  If your pain worsens, he will need to go to the emergency room for further evaluation.   Meds ordered this encounter  Medications  . polyethylene glycol powder (MIRALAX) powder    Sig: Take 255 g by mouth once for 1 dose.    Dispense:  255 g    Refill:  0    Reviewed expectations re: course of current medical issues. Questions answered. Outlined signs and symptoms indicating need for more acute intervention. Patient verbalized understanding. After Visit Summary given.    Procedures:      Elvina Sidle, MD 09/16/17 [phone]

## 2017-09-18 ENCOUNTER — Telehealth: Payer: Self-pay | Admitting: Emergency Medicine

## 2017-09-18 NOTE — Telephone Encounter (Signed)
Spoke with patient he states that he is doing much better.

## 2017-09-21 DIAGNOSIS — E291 Testicular hypofunction: Secondary | ICD-10-CM | POA: Diagnosis not present

## 2017-09-22 DIAGNOSIS — Z3189 Encounter for other procreative management: Secondary | ICD-10-CM | POA: Diagnosis not present

## 2017-09-24 MED FILL — SILDENAFIL 20 MG TABLET: 20 | 6 days supply | Qty: 30 | Fill #0

## 2017-09-24 MED FILL — CLOMIPHENE CITRATE 50 MG TA: 50 | 30 days supply | Qty: 30 | Fill #0

## 2017-09-28 MED FILL — ANASTROZOLE 1 MG TABLET: 1 | 30 days supply | Qty: 60 | Fill #0

## 2017-10-06 ENCOUNTER — Encounter: Payer: 59 | Admitting: Family Medicine

## 2017-10-18 ENCOUNTER — Encounter: Payer: Self-pay | Admitting: Medical

## 2017-10-18 ENCOUNTER — Ambulatory Visit (INDEPENDENT_AMBULATORY_CARE_PROVIDER_SITE_OTHER): Payer: 59 | Admitting: Medical

## 2017-10-18 VITALS — BP 128/73 | HR 71 | Temp 98.1°F | Resp 16 | Ht 71.0 in | Wt 195.0 lb

## 2017-10-18 DIAGNOSIS — Z125 Encounter for screening for malignant neoplasm of prostate: Secondary | ICD-10-CM

## 2017-10-18 DIAGNOSIS — Z1211 Encounter for screening for malignant neoplasm of colon: Secondary | ICD-10-CM | POA: Diagnosis not present

## 2017-10-18 DIAGNOSIS — E119 Type 2 diabetes mellitus without complications: Secondary | ICD-10-CM

## 2017-10-18 DIAGNOSIS — Z0000 Encounter for general adult medical examination without abnormal findings: Secondary | ICD-10-CM

## 2017-10-18 LAB — CBC WITH DIFFERENTIAL/PLATELET
BASOS PCT: 2 % (ref 0.0–3.0)
Basophils Absolute: 0.1 10*3/uL (ref 0.0–0.1)
EOS ABS: 0.1 10*3/uL (ref 0.0–0.7)
Eosinophils Relative: 2.7 % (ref 0.0–5.0)
HCT: 40.5 % (ref 39.0–52.0)
HEMOGLOBIN: 13.6 g/dL (ref 13.0–17.0)
Lymphs Abs: 1.7 10*3/uL (ref 0.7–4.0)
MCHC: 33.6 g/dL (ref 30.0–36.0)
MCV: 86.1 fl (ref 78.0–100.0)
MONO ABS: 0.3 10*3/uL (ref 0.1–1.0)
Monocytes Relative: 9.2 % (ref 3.0–12.0)
NEUTROS ABS: 0.9 10*3/uL — AB (ref 1.4–7.7)
Neutrophils Relative %: 28.9 % — ABNORMAL LOW (ref 43.0–77.0)
PLATELETS: 191 10*3/uL (ref 150.0–400.0)
RBC: 4.7 Mil/uL (ref 4.22–5.81)
RDW: 13.6 % (ref 11.5–15.5)
WBC: 3 10*3/uL — AB (ref 4.0–10.5)

## 2017-10-18 LAB — POC URINALSYSI DIPSTICK (AUTOMATED)
BILIRUBIN UA: NEGATIVE
Glucose, UA: NEGATIVE
Ketones, UA: NEGATIVE
Leukocytes, UA: NEGATIVE
NITRITE UA: NEGATIVE
PH UA: 6 (ref 5.0–8.0)
PROTEIN UA: NEGATIVE
RBC UA: NEGATIVE
Spec Grav, UA: 1.02 (ref 1.010–1.025)
Urobilinogen, UA: 0.2 E.U./dL

## 2017-10-18 LAB — COMPREHENSIVE METABOLIC PANEL
ALT: 22 U/L (ref 0–53)
AST: 40 U/L — AB (ref 0–37)
Albumin: 4.4 g/dL (ref 3.5–5.2)
Alkaline Phosphatase: 54 U/L (ref 39–117)
BILIRUBIN TOTAL: 0.7 mg/dL (ref 0.2–1.2)
BUN: 18 mg/dL (ref 6–23)
CHLORIDE: 106 meq/L (ref 96–112)
CO2: 30 mEq/L (ref 19–32)
CREATININE: 1.27 mg/dL (ref 0.40–1.50)
Calcium: 9.5 mg/dL (ref 8.4–10.5)
GFR: 74.54 mL/min (ref >60.00)
GLUCOSE: 135 mg/dL — AB (ref 70–99)
Potassium: 4.8 mEq/L (ref 3.5–5.1)
SODIUM: 140 meq/L (ref 135–145)
Total Protein: 7 g/dL (ref 6.0–8.3)

## 2017-10-18 LAB — LIPID PANEL
CHOL/HDL RATIO: 6
Cholesterol: 251 mg/dL — ABNORMAL HIGH (ref 0–200)
HDL: 44.5 mg/dL (ref >39.00)
NonHDL: 206.73
Triglycerides: 214 mg/dL — ABNORMAL HIGH (ref 0.0–149.0)
VLDL: 42.8 mg/dL — ABNORMAL HIGH (ref 0.0–40.0)

## 2017-10-18 LAB — LDL CHOLESTEROL, DIRECT: Direct LDL: 97 mg/dL

## 2017-10-18 LAB — HEMOGLOBIN A1C: Hgb A1c MFr Bld: 6.8 % — ABNORMAL HIGH (ref 4.6–6.5)

## 2017-10-18 LAB — PSA: PSA: 0.47 ng/mL (ref 0.10–4.00)

## 2017-10-18 MED FILL — SILDENAFIL 20 MG TABLET: 20 | 6 days supply | Qty: 30 | Fill #1

## 2017-10-18 NOTE — Progress Notes (Signed)
Subjective:    Patient ID: Michael Guerra, male    DOB: 1961-02-28, 57 y.o.   MRN: [id number]  HPI  Pt here for CPE.  He is fasting.  Up to date on t-dap.  Due for colonoscopy.   Pt does exercise regularly. Recent break from exercise due to ellbow. It is almost resolved. He states healthy diet. He does more ketogenic diet. Some intermittent fasting. 2 cups of coffee a day. Nonsmoker and no alcohol. Works for [employer]. Works for [employer] at [employer].    Review of Systems  Constitutional: Negative for chills, fatigue and fever.  HENT: Negative for congestion, ear pain, nosebleeds, postnasal drip and rhinorrhea.   Respiratory: Negative for cough, chest tightness, shortness of breath and wheezing.   Cardiovascular: Negative for chest pain and palpitations.  Gastrointestinal: Negative for abdominal distention, abdominal pain, anal bleeding and constipation.  Genitourinary: Negative for decreased urine volume, dysuria, flank pain, penile pain and scrotal swelling.  Musculoskeletal: Negative for back pain, myalgias and neck pain.       Resolving tennis elbow.almost gone.  Skin: Negative for rash.  Neurological: Negative for dizziness, syncope, speech difficulty, weakness, numbness and headaches.  Hematological: Negative for adenopathy.  Psychiatric/Behavioral: Negative for behavioral problems, confusion and dysphoric mood. The patient is not nervous/anxious and is not hyperactive.    Past Medical History:  Diagnosis Date  . Diabetes mellitus   . Hyperlipidemia   . Hypertension   . Solitary kidney      Social History   Socioeconomic History  . Marital status: Married    Spouse name: Not on file  . Number of children: Not on file  . Years of education: Not on file  . Highest education level: Not on file  Occupational History  . Not on file  Social Needs  . Financial resource strain: Not on file  . Food insecurity:    Worry: Not on file    Inability: Not on file  .  Transportation needs:    Medical: Not on file    Non-medical: Not on file  Tobacco Use  . Smoking status: Never Smoker  . Smokeless tobacco: Never Used  Substance and Sexual Activity  . Alcohol use: No  . Drug use: No  . Sexual activity: Not on file  Lifestyle  . Physical activity:    Days per week: Not on file    Minutes per session: Not on file  . Stress: Not on file  Relationships  . Social connections:    Talks on phone: Not on file    Gets together: Not on file    Attends religious service: Not on file    Active member of club or organization: Not on file    Attends meetings of clubs or organizations: Not on file    Relationship status: Not on file  . Intimate partner violence:    Fear of current or ex partner: Not on file    Emotionally abused: Not on file    Physically abused: Not on file    Forced sexual activity: Not on file  Other Topics Concern  . Not on file  Social History Narrative  . Not on file    Past Surgical History:  Procedure Laterality Date  . ABDOMINAL SURGERY    . KIDNEY SURGERY      Family History  Problem Relation Age of Onset  . Cancer Neg Hx     Allergies  Allergen Reactions  . Lisinopril Cough  Current Outpatient Medications on File Prior to Visit  Medication Sig Dispense Refill  . anastrozole (ARIMIDEX) 1 MG tablet Take 1 tablet by mouth 2 (two) times daily.  11  . aspirin 81 MG tablet Take 81 mg by mouth daily.      . clomiPHENE (CLOMID) 50 MG tablet Take 1 tablet by mouth daily.  11  . metFORMIN (GLUCOPHAGE) 1000 MG tablet TAKE 1 TABLET (1,000 MG TOTAL) BY MOUTH 2 (TWO) TIMES DAILY WITH A MEAL. 180 tablet 3  . Multiple Vitamins-Minerals (MULTIVITAMIN ADULT) TABS Take 1 tablet by mouth daily.    . Omega-3 Fatty Acids (FISH OIL) 1000 MG CAPS Take 1 capsule by mouth daily.    . pravastatin (PRAVACHOL) 40 MG tablet Take 1 tablet (40 mg total) by mouth daily. 90 tablet 3   No current facility-administered medications on file  prior to visit.     BP 128/73   Pulse 71   Temp 98.1 F (36.7 C) (Oral)   Resp 16   Ht 5\' 11"  (1.803 m)   Wt 195 lb (88.5 kg)   SpO2 100%   BMI 27.20 kg/m      Objective:   Physical Exam  General Mental Status- Alert. General Appearance- Not in acute distress.   Skin General: Color- Normal Color. Moisture- Normal Moisture. Scattered small moles and freckles. One small skin tag.  Neck Carotid Arteries- Normal color. Moisture- Normal Moisture. No carotid bruits. No JVD.  Chest and Lung Exam Auscultation: Breath Sounds:-Normal.  Cardiovascular Auscultation:Rythm- Regular. Murmurs & Other Heart Sounds:Auscultation of the heart reveals- No Murmurs.  Abdomen Inspection:-Inspeection Normal. Palpation/Percussion:Note:No mass. Palpation and Percussion of the abdomen reveal- Non Tender, Non Distended + BS, no rebound or guarding. Scars non abdomen.    Neurologic Cranial Nerve exam:- CN III-XII intact(No nystagmus), symmetric smile. Drift Test:- No drift. Romberg Exam:- Negative.  Heal to Toe Gait exam:-Normal. Finger to Nose:- Normal/Intact Strength:- 5/5 equal and symmetric strength both upper and lower extremities.  Genital-  deferred  Rectal-deferred today.(Pt will see urologist in next 5 months at [facility] urologist). They indicated per pt  his prostate will be checked.     Assessment & Plan:  For you wellness exam today I have ordered cbc, cmp, lipid panel, and ua.  For diabetes history did get a1c today.  Vaccine up to date.  Recommend exercise and healthy diet.  We will let you know lab results as they come in.  Follow up date appointment will be determined after lab review.    [phone]([facility] office to call.)

## 2017-10-18 NOTE — Patient Instructions (Addendum)
For you wellness exam today I have ordered cbc, cmp, lipid panel, and ua.  For diabetes history did get a1c today.  Vaccine up to date.  Recommend exercise and healthy diet.  We will let you know lab results as they come in.  Follow up date appointment will be determined after lab review.    [phone]([facility] office to call.) They want old report from prior GI office.Can sign release form here or maybe directly at GI office.    Preventive Care 40-64 Years, Male Preventive care refers to lifestyle choices and visits with your health care provider that can promote health and wellness. What does preventive care include?  A yearly physical exam. This is also called an annual well check.  Dental exams once or twice a year.  Routine eye exams. Ask your health care provider how often you should have your eyes checked.  Personal lifestyle choices, including: ? Daily care of your teeth and gums. ? Regular physical activity. ? Eating a healthy diet. ? Avoiding tobacco and drug use. ? Limiting alcohol use. ? Practicing safe sex. ? Taking low-dose aspirin every day starting at age 37. What happens during an annual well check? The services and screenings done by your health care provider during your annual well check will depend on your age, overall health, lifestyle risk factors, and family history of disease. Counseling Your health care provider may ask you questions about your:  Alcohol use.  Tobacco use.  Drug use.  Emotional well-being.  Home and relationship well-being.  Sexual activity.  Eating habits.  Work and work Statistician.  Screening You may have the following tests or measurements:  Height, weight, and BMI.  Blood pressure.  Lipid and cholesterol levels. These may be checked every 5 years, or more frequently if you are over 57 years old.  Skin check.  Lung cancer screening. You may have this screening every year starting at age 56 if you have a  30-pack-year history of smoking and currently smoke or have quit within the past 15 years.  Fecal occult blood test (FOBT) of the stool. You may have this test every year starting at age 105.  Flexible sigmoidoscopy or colonoscopy. You may have a sigmoidoscopy every 5 years or a colonoscopy every 10 years starting at age 2.  Prostate cancer screening. Recommendations will vary depending on your family history and other risks.  Hepatitis C blood test.  Hepatitis B blood test.  Sexually transmitted disease (STD) testing.  Diabetes screening. This is done by checking your blood sugar (glucose) after you have not eaten for a while (fasting). You may have this done every 1-3 years.  Discuss your test results, treatment options, and if necessary, the need for more tests with your health care provider. Vaccines Your health care provider may recommend certain vaccines, such as:  Influenza vaccine. This is recommended every year.  Tetanus, diphtheria, and acellular pertussis (Tdap, Td) vaccine. You may need a Td booster every 10 years.  Varicella vaccine. You may need this if you have not been vaccinated.  Zoster vaccine. You may need this after age 79.  Measles, mumps, and rubella (MMR) vaccine. You may need at least one dose of MMR if you were born in 1957 or later. You may also need a second dose.  Pneumococcal 13-valent conjugate (PCV13) vaccine. You may need this if you have certain conditions and have not been vaccinated.  Pneumococcal polysaccharide (PPSV23) vaccine. You may need one or two doses if you smoke  cigarettes or if you have certain conditions.  Meningococcal vaccine. You may need this if you have certain conditions.  Hepatitis A vaccine. You may need this if you have certain conditions or if you travel or work in places where you may be exposed to hepatitis A.  Hepatitis B vaccine. You may need this if you have certain conditions or if you travel or work in places where  you may be exposed to hepatitis B.  Haemophilus influenzae type b (Hib) vaccine. You may need this if you have certain risk factors.  Talk to your health care provider about which screenings and vaccines you need and how often you need them. This information is not intended to replace advice given to you by your health care provider. Make sure you discuss any questions you have with your health care provider. Document Released: 05/03/2015 Document Revised: 12/25/2015 Document Reviewed: 02/05/2015 Elsevier Interactive Patient Education  [employer].

## 2017-11-08 MED FILL — SILDENAFIL 20 MG TABLET: 20 | 6 days supply | Qty: 30 | Fill #2

## 2017-11-08 MED FILL — ANASTROZOLE 1 MG TABLET: 1 | 30 days supply | Qty: 60 | Fill #1

## 2017-11-17 DIAGNOSIS — Z3189 Encounter for other procreative management: Secondary | ICD-10-CM | POA: Diagnosis not present

## 2017-11-26 MED FILL — SILDENAFIL CITRATE 20 MG TA: 20 | 6 days supply | Qty: 30 | Fill #3

## 2017-12-02 MED FILL — ANASTROZOLE 1 MG TABLET: 1 | 30 days supply | Qty: 60 | Fill #2

## 2017-12-14 MED FILL — CLOMIPHENE CITRATE 50 MG TA: 50 | 30 days supply | Qty: 30 | Fill #1

## 2017-12-14 MED FILL — SILDENAFIL CITRATE 20 MG TA: 20 | 6 days supply | Qty: 30 | Fill #4

## 2018-01-11 MED FILL — CLOMIPHENE CITRATE 50 MG TA: 50 | 30 days supply | Qty: 30 | Fill #2

## 2018-01-19 ENCOUNTER — Ambulatory Visit: Payer: 59 | Admitting: Family Medicine

## 2018-01-20 MED FILL — SILDENAFIL CITRATE 20 MG TA: 20 | 6 days supply | Qty: 30 | Fill #5

## 2018-01-20 MED FILL — ANASTROZOLE 1 MG TABLET: 1 | 30 days supply | Qty: 60 | Fill #3

## 2018-01-25 ENCOUNTER — Encounter: Payer: Self-pay | Admitting: Family Medicine

## 2018-01-25 ENCOUNTER — Encounter: Payer: Self-pay | Admitting: Gastroenterology

## 2018-02-16 ENCOUNTER — Encounter: Payer: Self-pay | Admitting: Family Medicine

## 2018-02-16 ENCOUNTER — Ambulatory Visit: Payer: 59 | Admitting: Family Medicine

## 2018-02-16 VITALS — BP 142/84 | HR 82 | Temp 98.5°F | Ht 73.0 in | Wt 201.0 lb

## 2018-02-16 DIAGNOSIS — E119 Type 2 diabetes mellitus without complications: Secondary | ICD-10-CM | POA: Diagnosis not present

## 2018-02-16 DIAGNOSIS — E785 Hyperlipidemia, unspecified: Secondary | ICD-10-CM

## 2018-02-16 DIAGNOSIS — B353 Tinea pedis: Secondary | ICD-10-CM

## 2018-02-16 MED ORDER — KETOCONAZOLE 2 % EX CREA: 1.0000 "application " | TOPICAL_CREAM | Freq: Every day | CUTANEOUS | 0 refills | Status: AC

## 2018-02-16 MED ORDER — ACCU-CHEK FASTCLIX LANCETS MISC: 3 refills | Status: DC

## 2018-02-16 MED ORDER — ATORVASTATIN CALCIUM 40 MG PO TABS: 40.0000 mg | ORAL_TABLET | Freq: Every day | ORAL | 3 refills | Status: DC

## 2018-02-16 MED FILL — ATORVASTATIN 40 MG TABLET: 40 | 90 days supply | Qty: 90 | Fill #0

## 2018-02-16 MED FILL — ACCU-CHEK FASTCLIX LANCETS: 51 days supply | Qty: 102 | Fill #0

## 2018-02-16 MED FILL — KETOCONAZOLE 2% CREAM: 2 | 30 days supply | Qty: 60 | Fill #0

## 2018-02-16 NOTE — Patient Instructions (Signed)
Ok to stop checking blood sugars at home.  I want you 130 or less on the top and less than 90 on the bottom. You need both to meet these requirements.  Ck at home. If consistently above this level, let me know.   Let us know if you need anything.

## 2018-02-16 NOTE — Progress Notes (Signed)
Subjective:   Chief Complaint  Patient presents with  . Follow-up    Michael Guerra is a 57 y.o. male here for follow-up of diabetes.   Laken's self monitored glucose range is  Patient denies hypoglycemic reactions. He checks his glucose levels 2 times per day. Patient does not require insulin.   Medications include: diet control Exercise: walking, active at work, lifting weights  Hyperlipidemia Patient presents for dyslipidemia follow up. Is not taking pravastatin. He is adhering to a healthy. Exercise: walking, lifting weights. The patient is not known to have coexisting coronary artery disease.   Past Medical History:  Diagnosis Date  . Diabetes mellitus   . Hyperlipidemia   . Hypertension   . Solitary kidney      Related testing: Date of retinal exam: 04/2017   Pneumovax: done Flu Shot: done  Review of Systems: Pulmonary:  No SOB Cardiovascular:  No chest pain  Objective:  BP (!) 142/84 (BP Location: Left Arm, Patient Position: Sitting, Cuff Size: Normal)   Pulse 82   Temp 98.5 F (36.9 C) (Oral)   Ht 6\' 1"  (1.854 m)   Wt 201 lb (91.2 kg)   SpO2 98%   BMI 26.52 kg/m  General:  Well developed, well nourished, in no apparent distress Skin:  Dry skin on feet b/l,  macerated tissue between 4-5th digit and 2nd-3rd digit on R, 1st and 2nd on L; no other lesions noted Head:  Normocephalic, atraumatic Eyes:  Pupils equal and round, sclera anicteric without injection  Lungs:  CTAB, no access msc use Cardio:  RRR, no bruits, no LE edema Musculoskeletal:  Symmetrical muscle groups noted without atrophy or deformity Neuro:  Sensation intact to pinprick on feet, Psych: Age appropriate judgment and insight  Assessment:   Diabetes mellitus type 2 in nonobese (HCC) - Plan: ACCU-CHEK FASTCLIX LANCETS MISC, glucose blood (ACCU-CHEK ACTIVE STRIPS) test strip, atorvastatin (LIPITOR) 40 MG tablet, Hemoglobin A1c, HM DIABETES FOOT EXAM  Hyperlipidemia, unspecified  hyperlipidemia type - Plan: atorvastatin (LIPITOR) 40 MG tablet, Lipid panel  Tinea pedis of both feet - Plan: ketoconazole (NIZORAL) 2 % cream   Plan:   Orders as above. Ck labs.  Start back on statin.  Counseled on diet and exercise.  Ketoconazole for 6 mo for feet. F/u in 6 mo. The patient voiced understanding and agreement to the plan.  [name] [place], DO 02/16/18 4:00 PM

## 2018-02-16 NOTE — Progress Notes (Signed)
Pre visit review using our clinic review tool, if applicable. No additional management support is needed unless otherwise documented below in the visit note. 

## 2018-02-17 LAB — LIPID PANEL
CHOLESTEROL: 222 mg/dL — AB (ref 0–200)
HDL: 45.4 mg/dL (ref >39.00)
LDL Cholesterol: 141 mg/dL — ABNORMAL HIGH (ref 0–99)
NonHDL: 176.87
Total CHOL/HDL Ratio: 5
Triglycerides: 178 mg/dL — ABNORMAL HIGH (ref 0.0–149.0)
VLDL: 35.6 mg/dL (ref 0.0–40.0)

## 2018-02-17 LAB — HEMOGLOBIN A1C: HEMOGLOBIN A1C: 7.1 % — AB (ref 4.6–6.5)

## 2018-02-18 ENCOUNTER — Telehealth: Payer: Self-pay | Admitting: *Deleted

## 2018-02-18 DIAGNOSIS — E119 Type 2 diabetes mellitus without complications: Secondary | ICD-10-CM

## 2018-02-18 MED ORDER — GLUCOSE BLOOD VI STRP: ORAL_STRIP | 1 refills | Status: DC

## 2018-02-18 MED FILL — ACCU-CHEK GUIDE TEST STRIP: 50 days supply | Qty: 100 | Fill #0

## 2018-02-18 NOTE — Telephone Encounter (Signed)
Received fax from [facility] pharmacy requesting RX for accu-chek test strips. Spoke with pt and verified that he wants to continue checking BS at home due to recent a1c elevation. Rx sent.

## 2018-02-22 ENCOUNTER — Ambulatory Visit (AMBULATORY_SURGERY_CENTER): Payer: Self-pay

## 2018-02-22 VITALS — Ht 73.0 in | Wt 206.0 lb

## 2018-02-22 DIAGNOSIS — Z1211 Encounter for screening for malignant neoplasm of colon: Secondary | ICD-10-CM

## 2018-02-22 MED ORDER — PEG 3350-KCL-NA BICARB-NACL 420 G PO SOLR: 4000.0000 mL | Freq: Once | ORAL | 0 refills | Status: AC

## 2018-02-22 MED FILL — GAVILYTE-G SOLUTION: 236 | 1 days supply | Qty: 4000 | Fill #0

## 2018-02-22 NOTE — Progress Notes (Signed)
Denies allergies to eggs or soy products. Denies complication of anesthesia or sedation. Denies use of weight loss medication. Denies use of O2.   Emmi instructions declined.  

## 2018-02-23 MED FILL — ANASTROZOLE 1 MG TABLET: 1 | 30 days supply | Qty: 60 | Fill #4

## 2018-02-25 ENCOUNTER — Encounter: Payer: Self-pay | Admitting: Gastroenterology

## 2018-03-03 MED FILL — CLOMIPHENE CITRATE 50 MG TA: 50 | 30 days supply | Qty: 30 | Fill #3

## 2018-03-09 MED FILL — SILDENAFIL CITRATE 20 MG TA: 20 | 10 days supply | Qty: 50 | Fill #0

## 2018-03-11 ENCOUNTER — Ambulatory Visit (AMBULATORY_SURGERY_CENTER): Payer: 59 | Admitting: Gastroenterology

## 2018-03-11 ENCOUNTER — Encounter: Payer: Self-pay | Admitting: Gastroenterology

## 2018-03-11 VITALS — BP 154/93 | HR 70 | Temp 97.8°F | Resp 12 | Ht 73.0 in | Wt 206.0 lb

## 2018-03-11 DIAGNOSIS — Z86010 Personal history of colonic polyps: Secondary | ICD-10-CM | POA: Diagnosis not present

## 2018-03-11 DIAGNOSIS — Z1211 Encounter for screening for malignant neoplasm of colon: Secondary | ICD-10-CM

## 2018-03-11 MED ORDER — SODIUM CHLORIDE 0.9 % IV SOLN: 500.0000 mL | Freq: Once | INTRAVENOUS | Status: DC

## 2018-03-11 NOTE — Progress Notes (Signed)
A and O x3. Report to RN. Tolerated MAC anesthesia well.

## 2018-03-11 NOTE — Patient Instructions (Signed)
YOU HAD AN ENDOSCOPIC PROCEDURE TODAY AT THE [facility]:   Refer to the procedure report that was given to you for any specific questions about what was found during the examination.  If the procedure report does not answer your questions, please call your gastroenterologist to clarify.  If you requested that your care partner not be given the details of your procedure findings, then the procedure report has been included in a sealed envelope for you to review at your convenience later.  YOU SHOULD EXPECT: Some feelings of bloating in the abdomen. Passage of more gas than usual.  Walking can help get rid of the air that was put into your GI tract during the procedure and reduce the bloating. If you had a lower endoscopy (such as a colonoscopy or flexible sigmoidoscopy) you may notice spotting of blood in your stool or on the toilet paper. If you underwent a bowel prep for your procedure, you may not have a normal bowel movement for a few days.  Please Note:  You might notice some irritation and congestion in your nose or some drainage.  This is from the oxygen used during your procedure.  There is no need for concern and it should clear up in a day or so.  SYMPTOMS TO REPORT IMMEDIATELY:   Following lower endoscopy (colonoscopy or flexible sigmoidoscopy):  Excessive amounts of blood in the stool  Significant tenderness or worsening of abdominal pains  Swelling of the abdomen that is new, acute  Fever of 100F or higher  Please see handouts given to you on High Fiber Diet and Hemorrhoids. Use Fiber for example Citracel, Fibercon, Konsyl or Metamucil 1-2 times daily. Miralax 1 capful (17 grams) in 8 ounces of water every other day or daily in order to have atleast 1 soft bowel movement a day.  For urgent or emergent issues, a gastroenterologist can be reached at any hour by calling [phone].   DIET:  We do recommend a small meal at first, but then you may proceed to your  regular diet.  Drink plenty of fluids but you should avoid alcoholic beverages for 24 hours.  ACTIVITY:  You should plan to take it easy for the rest of today and you should NOT DRIVE or use heavy machinery until tomorrow (because of the sedation medicines used during the test).    FOLLOW UP: Our staff will call the number listed on your records the next business day following your procedure to check on you and address any questions or concerns that you may have regarding the information given to you following your procedure. If we do not reach you, we will leave a message.  However, if you are feeling well and you are not experiencing any problems, there is no need to return our call.  We will assume that you have returned to your regular daily activities without incident.  If any biopsies were taken you will be contacted by phone or by letter within the next 1-3 weeks.  Please call us at [phone] if you have not heard about the biopsies in 3 weeks.    SIGNATURES/CONFIDENTIALITY: You and/or your care partner have signed paperwork which will be entered into your electronic medical record.  These signatures attest to the fact that that the information above on your After Visit Summary has been reviewed and is understood.  Full responsibility of the confidentiality of this discharge information lies with you and/or your care-partner.  Thank you for letting us  take care of your healthcare needs today.

## 2018-03-11 NOTE — Op Note (Signed)
[place] Patient Name: Michael Guerra Procedure Date: 03/11/2018 11:50 AM MRN: [id number] Endoscopist: Justice Britain , MD Age: 57 Referring MD:  Date of Birth: [birth year] Gender: Male Account #: [id number] Procedure:                Colonoscopy Indications:              High risk colon cancer surveillance: Personal                            history of colonic polyps Medicines:                Monitored Anesthesia Care Procedure:                Pre-Anesthesia Assessment:                           - Prior to the procedure, a History and Physical                            was performed, and patient medications and                            allergies were reviewed. The patient's tolerance of                            previous anesthesia was also reviewed. The risks                            and benefits of the procedure and the sedation                            options and risks were discussed with the patient.                            All questions were answered, and informed consent                            was obtained. Prior Anticoagulants: The patient has                            taken aspirin. ASA Grade Assessment: II - A patient                            with mild systemic disease. After reviewing the                            risks and benefits, the patient was deemed in                            satisfactory condition to undergo the procedure.                           After obtaining informed consent, the colonoscope  was passed under direct vision. Throughout the                            procedure, the patient's blood pressure, pulse, and                            oxygen saturations were monitored continuously. The                            Model CF-HQ190L [id number]) scope was introduced                            through the anus and advanced to the 5 cm into the                            ileum. The colonoscopy  was technically difficult                            and complex due to restricted mobility of the                            colon, significant looping and a tortuous colon.                            Successful completion of the procedure was aided by                            changing the patient's position onto near prone                            positioning and then use of manual pressure in the                            LLQ and LUQ regions, withdrawing and reinserting                            the scope, straightening and shortening the scope                            to obtain bowel loop reduction, using scope torsion                            and applying abdominal pressure. The quality of the                            bowel preparation was evaluated using the BBPS                            [facility] Bowel Preparation Scale) with scores of:                            Right Colon = 2 (minor amount of residual staining,  small fragments of stool and/or opaque liquid, but                            mucosa seen well), Transverse Colon = 3 (entire                            mucosa seen well with no residual staining, small                            fragments of stool or opaque liquid) and Left Colon                            = 3 (entire mucosa seen well with no residual                            staining, small fragments of stool or opaque                            liquid). The total BBPS score equals 8. The quality                            of the bowel preparation was good. Scope In: 12:03:06 PM Scope Out: 12:24:14 PM Scope Withdrawal Time: 0 hours 11 minutes 43 seconds  Total Procedure Duration: 0 hours 21 minutes 8 seconds  Findings:                 Skin tags were found on perianal exam.                           The digital rectal exam findings include                            non-thrombosed internal hemorrhoids. Pertinent                             negatives include no palpable rectal lesions.                           The terminal ileum and ileocecal valve appeared                            normal.                           The transverse colon was grossly tortuous.                           Normal mucosa was found in the entire colon                            otherwise.                           Non-bleeding non-thrombosed internal hemorrhoids                              were found during retroflexion, during perianal                            exam and during digital exam. The hemorrhoids were                            Grade III (internal hemorrhoids that prolapse but                            require manual reduction). Complications:            No immediate complications. Estimated Blood Loss:     Estimated blood loss was minimal. Impression:               - Perianal skin tags found on perianal exam.                           - Non-thrombosed internal hemorrhoids found on                            digital rectal exam.                           - The examined portion of the ileum was normal.                           - Tortuous colon.                           - Normal mucosa in the entire examined colon                            otherwise.                           - Non-bleeding non-thrombosed internal hemorrhoids. Recommendation:           - The patient will be observed post-procedure,                            until all discharge criteria are met.                           - Discharge patient to home.                           - Patient has a contact number available for                            emergencies. The signs and symptoms of potential                            delayed complications were discussed with the                            patient. Return to normal activities tomorrow.  Written discharge instructions were provided to the                            patient.                            - High fiber diet.                           - Use fiber, for example Citrucel, Fibercon, Konsyl                            or Metamucil 1-2 times daily.                           - Miralax 1 capful (17 grams) in 8 ounces of water                            PO every other day or daily in order to have at                            least 1 soft bowel movement daily.                           - Repeat colonoscopy in 5 years for surveillance.                           - Consider role of pediatric colonoscope to aid in                            passage in region of significant tortuosity (query                            if there is an internal hernia).                           - The findings and recommendations were discussed                            with the patient.                           - The findings and recommendations were discussed                            with the designated responsible adult. Gabriel Mansouraty, MD 03/11/2018 12:31:35 PM 

## 2018-03-14 ENCOUNTER — Telehealth: Payer: Self-pay

## 2018-03-14 NOTE — Telephone Encounter (Signed)
  Follow up Call-  Call back number 03/11/2018  Post procedure Call Back phone  # [phone]  Permission to leave phone message Yes  Some recent data might be hidden     Patient questions:  Do you have a fever, pain , or abdominal swelling? No. Pain Score  0 *  Have you tolerated food without any problems? Yes.    Have you been able to return to your normal activities? Yes.    Do you have any questions about your discharge instructions: Diet   No. Medications  No. Follow up visit  No.  Do you have questions or concerns about your Care? No.  Actions: * If pain score is 4 or above: No action needed, pain <4.  No problems noted per pt. maw

## 2018-03-16 DIAGNOSIS — E291 Testicular hypofunction: Secondary | ICD-10-CM | POA: Diagnosis not present

## 2018-03-21 DIAGNOSIS — E291 Testicular hypofunction: Secondary | ICD-10-CM | POA: Diagnosis not present

## 2018-03-21 DIAGNOSIS — N5201 Erectile dysfunction due to arterial insufficiency: Secondary | ICD-10-CM | POA: Diagnosis not present

## 2018-03-31 MED FILL — ANASTROZOLE 1 MG TABLET: 1 | 30 days supply | Qty: 60 | Fill #5

## 2018-04-05 ENCOUNTER — Other Ambulatory Visit: Payer: Self-pay | Admitting: Family Medicine

## 2018-04-05 DIAGNOSIS — R911 Solitary pulmonary nodule: Secondary | ICD-10-CM

## 2018-04-05 DIAGNOSIS — R918 Other nonspecific abnormal finding of lung field: Secondary | ICD-10-CM

## 2018-04-05 NOTE — Progress Notes (Signed)
[id number]  

## 2018-04-10 ENCOUNTER — Ambulatory Visit (HOSPITAL_BASED_OUTPATIENT_CLINIC_OR_DEPARTMENT_OTHER)
Admission: RE | Admit: 2018-04-10 | Discharge: 2018-04-10 | Disposition: A | Discharge status: Home / Self Care | Payer: 59 | Source: Ambulatory Visit | Attending: Family Medicine | Admitting: Family Medicine

## 2018-04-10 DIAGNOSIS — R911 Solitary pulmonary nodule: Secondary | ICD-10-CM | POA: Diagnosis not present

## 2018-04-10 DIAGNOSIS — R918 Other nonspecific abnormal finding of lung field: Secondary | ICD-10-CM | POA: Diagnosis not present

## 2018-04-11 ENCOUNTER — Other Ambulatory Visit (HOSPITAL_BASED_OUTPATIENT_CLINIC_OR_DEPARTMENT_OTHER): Payer: 59

## 2018-04-11 MED FILL — CLOMIPHENE CITRATE 50 MG TA: 50 | 30 days supply | Qty: 30 | Fill #4

## 2018-04-28 MED FILL — levoFLOXacin 500 MG TABS: 500 | 5 days supply | Qty: 5 | Fill #0

## 2018-05-09 MED FILL — CLOMIPHENE CITRATE 50 MG TA: 50 | 30 days supply | Qty: 30 | Fill #5

## 2018-05-09 MED FILL — ANASTROZOLE 1 MG TABLET: 1 | 30 days supply | Qty: 60 | Fill #6

## 2018-05-25 MED FILL — ACCU-CHEK GUIDE TEST STRIP: 50 days supply | Qty: 100 | Fill #1

## 2018-05-26 MED FILL — ATORVASTATIN 40 MG TABLET: 40 | 90 days supply | Qty: 90 | Fill #1

## 2018-06-13 MED FILL — SILDENAFIL CITRATE 20 MG TA: 20 | 4 days supply | Qty: 20 | Fill #6

## 2018-06-21 LAB — HM DIABETES EYE EXAM

## 2018-06-21 MED FILL — ANASTROZOLE 1 MG TABLET: 1 | 30 days supply | Qty: 60 | Fill #7

## 2018-06-21 MED FILL — CLOMIPHENE CITRATE 50 MG TA: 50 | 30 days supply | Qty: 30 | Fill #6

## 2018-06-23 ENCOUNTER — Telehealth: Payer: Self-pay | Admitting: *Deleted

## 2018-06-23 NOTE — Telephone Encounter (Signed)
Received Diabetic Eye Exam Report from [facility]; forwarded to provider/SLS 03/05

## 2018-06-29 ENCOUNTER — Encounter: Payer: Self-pay | Admitting: Family Medicine

## 2018-07-22 ENCOUNTER — Encounter: Payer: Self-pay | Admitting: Family Medicine

## 2018-07-22 NOTE — Telephone Encounter (Signed)
Copied from CRM [id number]. Topic: Appointment Scheduling - Scheduling Inquiry for Clinic >> Jul 22, 2018 11:47 AM Lorayne Bender wrote: Reason for CRM:   Pt's wife called to request that May 6th OV be done over the web. She can be reached at [phone]

## 2018-07-27 MED FILL — ANASTROZOLE 1 MG TABLET: 1 | 30 days supply | Qty: 60 | Fill #8

## 2018-07-27 MED FILL — CLOMIPHENE CITRATE 50 MG TA: 50 | 30 days supply | Qty: 30 | Fill #0

## 2018-07-29 MED FILL — SILDENAFIL CITRATE 20 MG TA: 20 | 30 days supply | Qty: 50 | Fill #0

## 2018-08-12 MED FILL — ACCU-CHEK GUIDE TEST STRIP: 50 days supply | Qty: 100 | Fill #2

## 2018-08-24 ENCOUNTER — Ambulatory Visit (INDEPENDENT_AMBULATORY_CARE_PROVIDER_SITE_OTHER): Payer: No Typology Code available for payment source | Admitting: Family Medicine

## 2018-08-24 ENCOUNTER — Other Ambulatory Visit: Payer: Self-pay

## 2018-08-24 ENCOUNTER — Encounter: Payer: Self-pay | Admitting: Family Medicine

## 2018-08-24 DIAGNOSIS — E119 Type 2 diabetes mellitus without complications: Secondary | ICD-10-CM

## 2018-08-24 MED ORDER — METFORMIN HCL ER 750 MG PO TB24: 750.0000 mg | ORAL_TABLET | Freq: Every day | ORAL | 5 refills | Status: DC

## 2018-08-24 MED FILL — metFORMIN HCL ER 750 MG TB2: 750 | 30 days supply | Qty: 30 | Fill #0

## 2018-08-24 NOTE — Progress Notes (Addendum)
Subjective:   Chief Complaint  Patient presents with  . Follow-up    6 month    Michael Guerra is a 58 y.o. male here for follow-up of diabetes.  Due to COVID-19 pandemic, we are interacting via web portal for an electronic face-to-face visit. I verified patient's ID using 2 identifiers. Patient agreed to proceed with visit via this method. Patient is at home, I am at office. Patient and I are present for visit.   [name]'s sugars have been running at 150-160's on average. Patient does not require insulin.   Medications include: diet controlled Diet has not been healthy lately. 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 He does monitor home blood pressures. Blood pressures ranging on average from 130's/80's. He is not on any medication routinely. He is not adhering to a healthy diet overall. Exercise: some walking   Past Medical History:  Diagnosis Date  . Blood transfusion without reported diagnosis   . Cataract   . Chronic kidney disease    one kidney that is healthy  . Diabetes mellitus   . Hyperlipidemia   . Hypertension   . Solitary kidney      Related testing: Date of retinal exam: Done Pneumovax: done Flu Shot: done  Review of Systems: Pulmonary:  No SOB Cardiovascular:  No chest pain  Objective:  No conversational dyspnea Age appropriate judgment and insight Nml affect and mood  Assessment:   Diabetes mellitus type 2 in nonobese (HCC) - Plan: metFORMIN (GLUCOPHAGE-XR) 750 MG 24 hr tablet, Hemoglobin A1c, Microalbumin / creatinine urine ratio   Plan:   1- uncontrolled. Restart Metformin. Ck above. Counseled on diet and exercise. 2- Ok to stay off of meds for now.  F/u pending above. The patient voiced understanding and agreement to the plan.  Michael Guerra [place], DO 08/24/18 9:46 AM

## 2018-08-26 ENCOUNTER — Other Ambulatory Visit: Payer: Self-pay

## 2018-08-26 ENCOUNTER — Other Ambulatory Visit (INDEPENDENT_AMBULATORY_CARE_PROVIDER_SITE_OTHER): Payer: No Typology Code available for payment source

## 2018-08-26 DIAGNOSIS — E119 Type 2 diabetes mellitus without complications: Secondary | ICD-10-CM | POA: Diagnosis not present

## 2018-08-26 LAB — MICROALBUMIN / CREATININE URINE RATIO
Creatinine,U: 72.8 mg/dL
Microalb Creat Ratio: 1 mg/g (ref 0.0–30.0)
Microalb, Ur: <0.7 mg/dL (ref 0.0–1.9)

## 2018-08-26 LAB — HEMOGLOBIN A1C: Hgb A1c MFr Bld: 8.9 % — ABNORMAL HIGH (ref 4.6–6.5)

## 2018-08-30 IMAGING — DX DG ABDOMEN ACUTE W/ 1V CHEST
4 series · 4 of 4 positions shown · non-contrast
Comparison: None.

CLINICAL DATA: Painful abdomen.

EXAM:
DG ABDOMEN ACUTE W/ 1V CHEST

[chest pa]
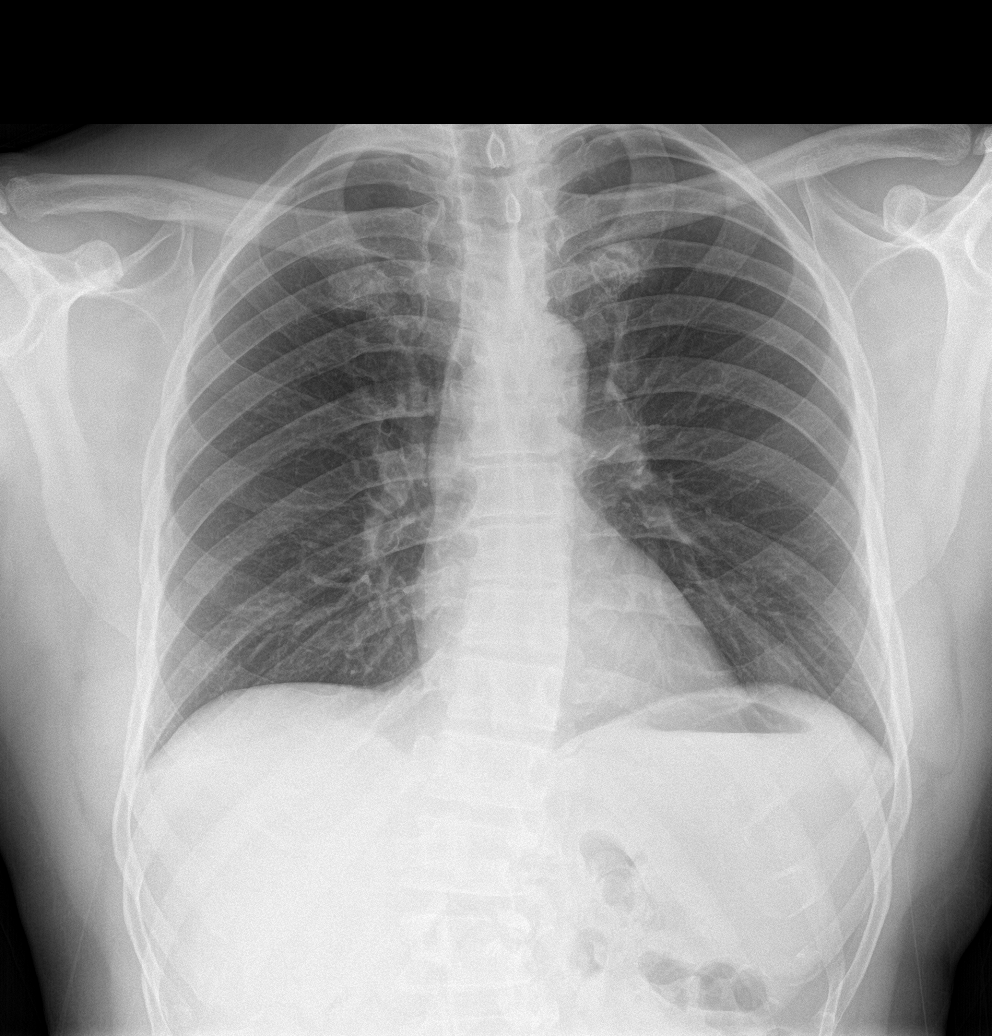

[abdomen erect]
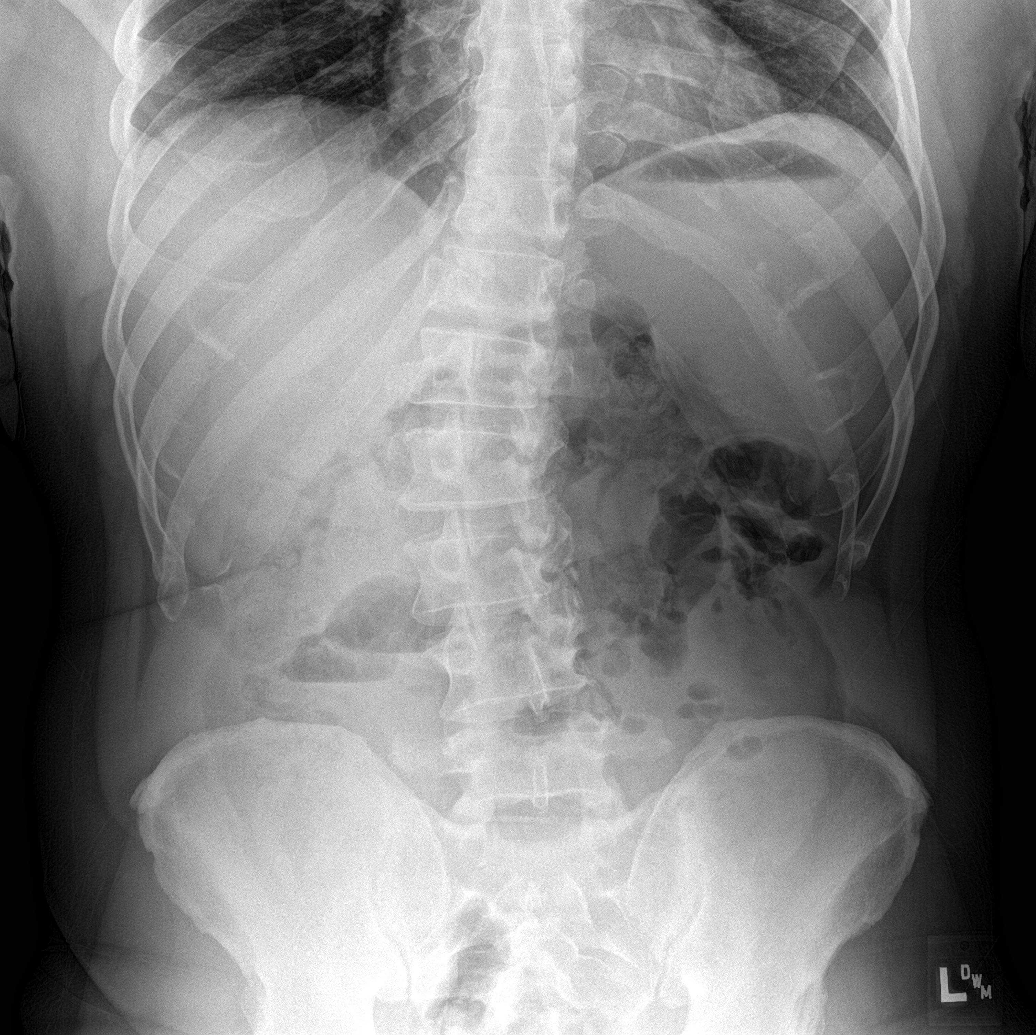

[abdomen supine (1 of 2)]
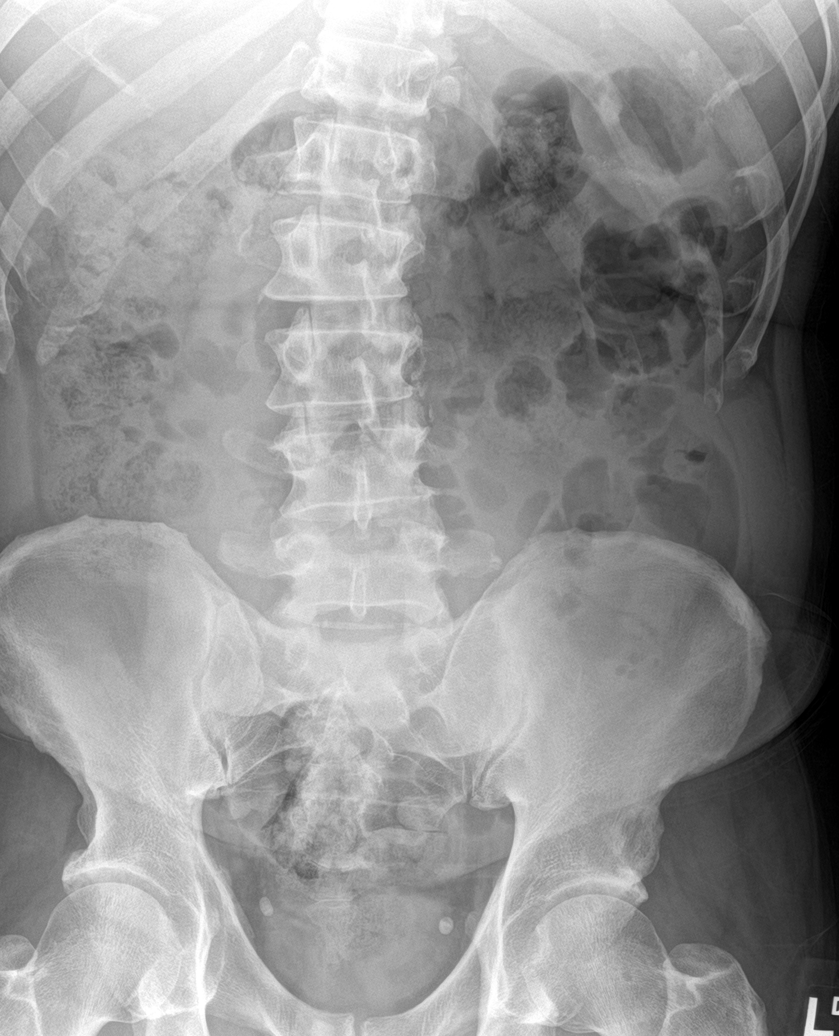

[abdomen supine (2 of 2)]
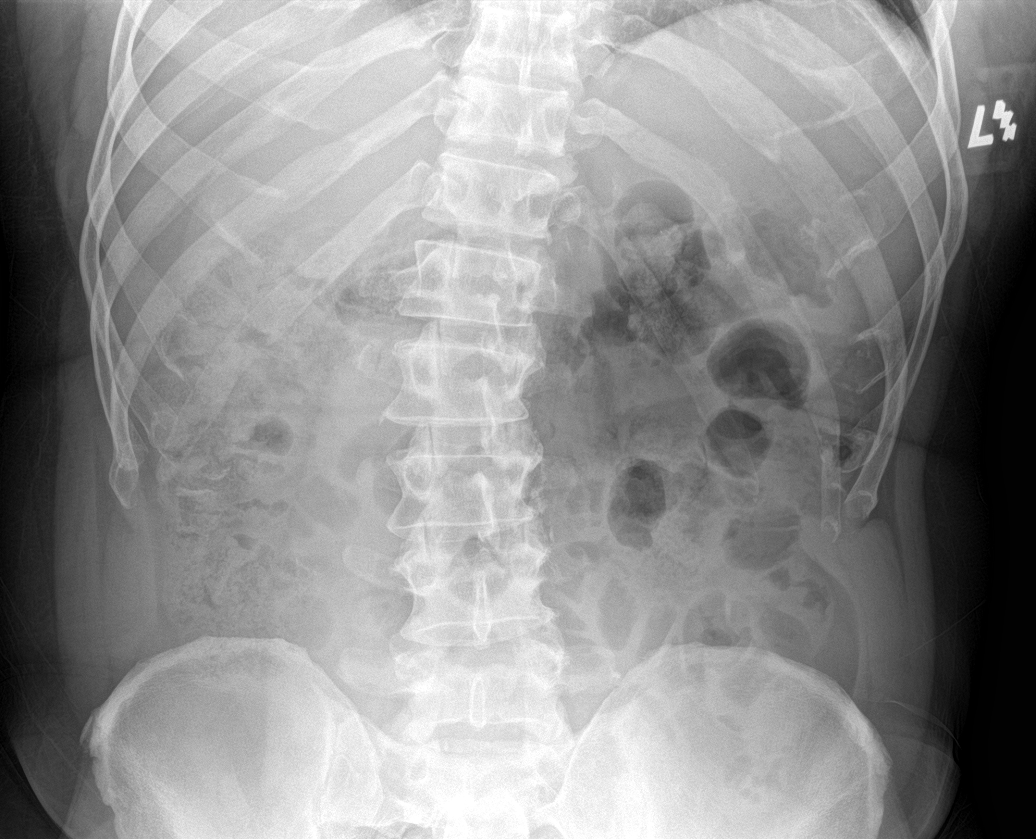

[4 of 4 positions shown; findings below may reference images not displayed]

FINDINGS: Moderate fecal loading in the colon. Phleboliths in the pelvis. No
free air, portal venous gas, pneumatosis, or bowel obstruction. The
chest is normal.
IMPRESSION: Moderate fecal loading in the colon.

## 2018-08-31 MED FILL — CLOMIPHENE CITRATE 50 MG TA: 50 | 30 days supply | Qty: 30 | Fill #1

## 2018-08-31 MED FILL — ANASTROZOLE 1 MG TABLET: 1 | 30 days supply | Qty: 60 | Fill #9

## 2018-09-26 MED FILL — ANASTROZOLE 1 MG TABLET: 1 | 30 days supply | Qty: 60 | Fill #10

## 2018-09-26 MED FILL — ATORVASTATIN 40 MG TABLET: 40 | 90 days supply | Qty: 90 | Fill #2

## 2018-09-26 MED FILL — CLOMIPHENE CITRATE 50 MG TA: 50 | 30 days supply | Qty: 30 | Fill #2

## 2018-10-03 ENCOUNTER — Encounter: Payer: Self-pay | Admitting: Family Medicine

## 2018-10-03 ENCOUNTER — Ambulatory Visit (INDEPENDENT_AMBULATORY_CARE_PROVIDER_SITE_OTHER): Payer: No Typology Code available for payment source | Admitting: Family Medicine

## 2018-10-03 ENCOUNTER — Other Ambulatory Visit: Payer: Self-pay

## 2018-10-03 DIAGNOSIS — E119 Type 2 diabetes mellitus without complications: Secondary | ICD-10-CM

## 2018-10-03 NOTE — Progress Notes (Signed)
CC: DM f/u  Subjective: Patient is a 58 y.o. male here for sugar f/u. Due to COVID-19 pandemic, we are interacting via web portal for an electronic face-to-face visit. I verified patient's ID using 2 identifiers. Patient agreed to proceed with visit via this method. Patient is at home, I am at home. Patient and I are present for visit.   Hx of DM, last A1c was 8.9. Had been relatively well controlled. He has not been taking Metformin XR 750 mg/d due to concern that it would lower his sugar too much; he denies hypoglycemia. 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 Started doing more wt resistance and cleaned up diet. Has lost around 7 lbs. Sugars running 130's on av. Feels better.   ROS: Heart: Denies chest pain  Lungs: Denies SOB   Past Medical History:  Diagnosis Date  . Blood transfusion without reported diagnosis   . Cataract   . Chronic kidney disease    one kidney that is healthy  . Diabetes mellitus   . Hyperlipidemia   . Hypertension   . Solitary kidney     Objective: No conversational dyspnea Age appropriate judgment and insight Nml affect and mood  Assessment and Plan: Diabetes mellitus type 2 in nonobese ([place]) - Plan: Counseled on diet and exercise. Counseled that Metformin is not a/w hypoglycemia, start back on it. I will see him in 2 months to reck, will get A1c at that time. Given his age, goal A1c is <7.5.  The patient voiced understanding and agreement to the plan.  [place], DO 10/03/18  8:09 AM

## 2018-10-14 MED FILL — metFORMIN HCL ER 750 MG TB2: 750 | 30 days supply | Qty: 30 | Fill #1

## 2018-10-31 MED FILL — CLOMIPHENE CITRATE 50 MG TA: 50 | 30 days supply | Qty: 30 | Fill #3

## 2018-11-02 MED FILL — ANASTROZOLE 1 MG TABLET: 1 | 30 days supply | Qty: 60 | Fill #0

## 2018-11-07 MED FILL — ACCU-CHEK GUIDE TEST STRIP: 50 days supply | Qty: 100 | Fill #3

## 2018-11-24 ENCOUNTER — Telehealth: Payer: Self-pay | Admitting: *Deleted

## 2018-11-24 MED ORDER — DAPAGLIFLOZIN PROPANEDIOL 10 MG PO TABS: 10.0000 mg | ORAL_TABLET | Freq: Every day | ORAL | 5 refills | Status: DC

## 2018-11-24 MED FILL — FARXIGA 10 MG TABLET: 10 | 90 days supply | Qty: 90 | Fill #0

## 2018-11-24 NOTE — Telephone Encounter (Signed)
[facility] sent a fax stating that metformin ER 750mg  has been recalled and would like an alternative.

## 2018-11-24 NOTE — Telephone Encounter (Signed)
Please notify patient, let me know we called in an alternative, and offer a payment card for Farxiga if need be. We should probably follow up with his diabetes next week anyway based on his last a1c. Ty.

## 2018-11-25 NOTE — Telephone Encounter (Signed)
Called the patient informed of change///already had picked up and cost was covered. Will see how it goes as has appt on 8/20 with PCP and can discuss

## 2018-11-25 NOTE — Telephone Encounter (Signed)
Called left message to call back 

## 2018-11-28 MED FILL — CLOMIPHENE CITRATE 50 MG TA: 50 | 30 days supply | Qty: 30 | Fill #0

## 2018-12-07 MED FILL — ANASTROZOLE 1 MG TABLET: 1 | 30 days supply | Qty: 60 | Fill #1

## 2018-12-08 ENCOUNTER — Other Ambulatory Visit: Payer: Self-pay

## 2018-12-09 ENCOUNTER — Encounter: Payer: Self-pay | Admitting: Family Medicine

## 2018-12-09 ENCOUNTER — Ambulatory Visit (INDEPENDENT_AMBULATORY_CARE_PROVIDER_SITE_OTHER): Payer: No Typology Code available for payment source | Admitting: Family Medicine

## 2018-12-09 VITALS — BP 122/84 | HR 87 | Temp 96.5°F | Ht 73.0 in | Wt 204.2 lb

## 2018-12-09 DIAGNOSIS — E119 Type 2 diabetes mellitus without complications: Secondary | ICD-10-CM | POA: Diagnosis not present

## 2018-12-09 LAB — HEMOGLOBIN A1C: Hgb A1c MFr Bld: 7.5 % — ABNORMAL HIGH (ref 4.6–6.5)

## 2018-12-09 NOTE — Patient Instructions (Signed)
Give [place] 2-3 business days to get the results of your labs back.   Keep the diet clean and stay active.  Aim to do some physical exertion for 150 minutes per week. This is typically divided into 5 days per week, 30 minutes per day. The activity should be enough to get your heart rate up. Anything is better than nothing if you have time constraints.  I recommend getting the flu shot in mid October. This suggestion would change if the CDC comes out with a different recommendation.   Let me look into the bone density scan.   Let us know if you need anything.

## 2018-12-09 NOTE — Progress Notes (Signed)
Subjective:   Chief Complaint  Patient presents with  . Follow-up    Michael Guerra is a 58 y.o. male here for follow-up of diabetes.   Michael Guerra's self monitored glucose range is mid 100's.  Patient denies hypoglycemic reactions. He checks his glucose levels 1 time per day. Patient does not require insulin.   Medications include: Farxiga 10 mg/d Diet has not been helpful Exercise: active at work; not as much recently  Past Medical History:  Diagnosis Date  . Blood transfusion without reported diagnosis   . Cataract   . Chronic kidney disease    one kidney that is healthy  . Diabetes mellitus   . Hyperlipidemia   . Hypertension   . Solitary kidney      Related testing: Date of retinal exam: Done Pneumovax: done Flu Shot: Rec'd for mid Oct.   Review of Systems: Pulmonary:  No SOB Cardiovascular:  No chest pain  Objective:  BP 122/84 (BP Location: Left Arm, Patient Position: Sitting, Cuff Size: Large)   Pulse 87   Temp (!) 96.5 F (35.8 C) (Temporal)   Ht 6\' 1"  (1.854 m)   Wt 204 lb 4 oz (92.6 kg)   SpO2 97%   BMI 26.95 kg/m  General:  Well developed, well nourished, in no apparent distress Skin:  Warm, no pallor or diaphoresis Eyes:  Pupils equal and round, sclera anicteric without injection  Lungs:  CTAB, no access msc use Cardio:  RRR, no bruits, no LE edema Psych: Age appropriate judgment and insight  Assessment:   Diabetes mellitus type 2 in nonobese ([place]) - Plan: Hemoglobin A1c   Plan:   Orders as above. Counseled on diet and exercise. May add low dose shorter acting metformin depending on A1c.  F/u in 3 mo. The patient voiced understanding and agreement to the plan.  [name], DO 12/09/18 8:32 AM

## 2018-12-13 MED FILL — BD 3 ML SYRINGE 18GX1-1/2: 18G X 1-1/2 | 28 days supply | Qty: 4 | Fill #0

## 2018-12-13 MED FILL — TESTOSTERONE CYP 100 MG/ML: 100 | 80 days supply | Qty: 10 | Fill #0

## 2018-12-13 MED FILL — BD NEEDLES 22GX1.5: 22G X 1-1/2 | 28 days supply | Qty: 4 | Fill #0

## 2019-01-04 MED FILL — ATORVASTATIN 40 MG TABLET: 40 | 90 days supply | Qty: 90 | Fill #3

## 2019-01-05 MED FILL — ANASTROZOLE 1 MG TABLET: 1 | 30 days supply | Qty: 60 | Fill #2

## 2019-02-16 MED FILL — SILDENAFIL CITRATE 20 MG TA: 20 | 30 days supply | Qty: 50 | Fill #1

## 2019-02-20 MED FILL — TESTOSTERONE CYP 100 MG/ML: 100 | 80 days supply | Qty: 10 | Fill #1

## 2019-03-07 ENCOUNTER — Other Ambulatory Visit: Payer: Self-pay

## 2019-03-08 ENCOUNTER — Other Ambulatory Visit: Payer: Self-pay

## 2019-03-08 ENCOUNTER — Ambulatory Visit (INDEPENDENT_AMBULATORY_CARE_PROVIDER_SITE_OTHER): Payer: No Typology Code available for payment source | Admitting: Family Medicine

## 2019-03-08 ENCOUNTER — Encounter: Payer: Self-pay | Admitting: Family Medicine

## 2019-03-08 VITALS — BP 130/80 | HR 79 | Temp 97.7°F | Ht 73.0 in | Wt 225.1 lb

## 2019-03-08 DIAGNOSIS — E785 Hyperlipidemia, unspecified: Secondary | ICD-10-CM | POA: Diagnosis not present

## 2019-03-08 DIAGNOSIS — E1165 Type 2 diabetes mellitus with hyperglycemia: Secondary | ICD-10-CM | POA: Diagnosis not present

## 2019-03-08 DIAGNOSIS — I10 Essential (primary) hypertension: Secondary | ICD-10-CM

## 2019-03-08 LAB — COMPREHENSIVE METABOLIC PANEL
ALT: 39 U/L (ref 0–53)
AST: 50 U/L — ABNORMAL HIGH (ref 0–37)
Albumin: 4.2 g/dL (ref 3.5–5.2)
Alkaline Phosphatase: 66 U/L (ref 39–117)
BUN: 19 mg/dL (ref 6–23)
CO2: 26 mEq/L (ref 19–32)
Calcium: 9.4 mg/dL (ref 8.4–10.5)
Chloride: 102 mEq/L (ref 96–112)
Creatinine, Ser: 1.7 mg/dL — ABNORMAL HIGH (ref 0.40–1.50)
GFR: 49.86 mL/min — ABNORMAL LOW (ref >60.00)
Glucose, Bld: 141 mg/dL — ABNORMAL HIGH (ref 70–99)
Potassium: 4.4 mEq/L (ref 3.5–5.1)
Sodium: 140 mEq/L (ref 135–145)
Total Bilirubin: 1.3 mg/dL — ABNORMAL HIGH (ref 0.2–1.2)
Total Protein: 6.8 g/dL (ref 6.0–8.3)

## 2019-03-08 LAB — HEMOGLOBIN A1C: Hgb A1c MFr Bld: 7.7 % — ABNORMAL HIGH (ref 4.6–6.5)

## 2019-03-08 LAB — LIPID PANEL
Cholesterol: 100 mg/dL (ref 0–200)
HDL: 36.7 mg/dL — ABNORMAL LOW (ref >39.00)
LDL Cholesterol: 40 mg/dL (ref 0–99)
NonHDL: 63.61
Total CHOL/HDL Ratio: 3
Triglycerides: 118 mg/dL (ref 0.0–149.0)
VLDL: 23.6 mg/dL (ref 0.0–40.0)

## 2019-03-08 NOTE — Progress Notes (Signed)
Subjective:   Chief Complaint  Patient presents with  . Follow-up    Michael Guerra is a 58 y.o. male here for follow-up of diabetes.   [name]'s self monitored glucose range is low-mid 100's.  Patient denies hypoglycemic reactions. He checks his glucose levels several times per week.  Patient does not require insulin.   Medications include: Farxiga 10 mg/d; questionable compliance Last a1c was 7.5. Diet is fair Exercise: Active at work, Civil engineer, contracting, wt resistance  Hypertension Patient presents for hypertension follow up. He does not routinely monitor home blood pressures. He is not currently on any medications. He is usually adhering to a healthy diet overall. Exercise: as above  Hyperlipidemia Patient presents for dyslipidemia follow up. Currently being treated with Lipitor 40 mg/d and compliance with treatment thus far has been good. He denies myalgias. He is usually adhering to a healthy diet. Exercise: as above The patient is not known to have coexisting coronary artery disease.  Past Medical History:  Diagnosis Date  . Blood transfusion without reported diagnosis   . Cataract   . Chronic kidney disease    one kidney that is healthy  . Diabetes mellitus   . Hyperlipidemia   . Hypertension   . Solitary kidney      Related testing: Date of retinal exam: Done Pneumovax: done Flu Shot: done  Review of Systems: Pulmonary:  No SOB Cardiovascular:  No chest pain  Objective:  BP 130/80 (BP Location: Left Arm, Patient Position: Sitting, Cuff Size: Large)   Pulse 79   Temp 97.7 F (36.5 C) (Temporal)   Ht _0  (1.854 m)   Wt 225 lb 2 oz (102.1 kg)   SpO2 96%   BMI 29.70 kg/m  General:  Well developed, well nourished, in no apparent distress Skin:  Warm, no pallor or diaphoresis Head:  Normocephalic, atraumatic Eyes:  Pupils equal and round, sclera anicteric without injection  Lungs:  CTAB, no access msc use Cardio:  RRR, no bruits, no LE  edema Musculoskeletal:  Symmetrical muscle groups noted without atrophy or deformity Neuro:  Sensation intact to pinprick on feet Psych: Age appropriate judgment and insight  Assessment:   Type 2 diabetes mellitus with hyperglycemia, without long-term current use of insulin (HCC) - Plan: HgB A1c, Lipid Profile, Comp Met (CMET), Comp Met (CMET), Lipid Profile, HgB A1c  Essential hypertension  Hyperlipidemia, unspecified hyperlipidemia type   Plan:   1- Take [place] daily. 2- Diet controlled at time. 3- Cont statin. Counseled on diet and exercise. F/u in 6 mo for CPE. The patient voiced understanding and agreement to the plan.  [place], DO 03/08/19 8:24 AM

## 2019-03-08 NOTE — Patient Instructions (Signed)
Give us 2-3 business days to get the results of your labs back.   Keep the diet clean and stay active.  Let us know if you need anything. 

## 2019-03-09 ENCOUNTER — Other Ambulatory Visit: Payer: Self-pay | Admitting: Family Medicine

## 2019-03-09 DIAGNOSIS — R7989 Other specified abnormal findings of blood chemistry: Secondary | ICD-10-CM

## 2019-03-09 DIAGNOSIS — R7401 Elevation of levels of liver transaminase levels: Secondary | ICD-10-CM

## 2019-03-15 ENCOUNTER — Other Ambulatory Visit (INDEPENDENT_AMBULATORY_CARE_PROVIDER_SITE_OTHER): Payer: No Typology Code available for payment source

## 2019-03-15 ENCOUNTER — Other Ambulatory Visit: Payer: Self-pay

## 2019-03-15 DIAGNOSIS — R7401 Elevation of levels of liver transaminase levels: Secondary | ICD-10-CM

## 2019-03-15 DIAGNOSIS — R7989 Other specified abnormal findings of blood chemistry: Secondary | ICD-10-CM

## 2019-03-15 LAB — COMPREHENSIVE METABOLIC PANEL
ALT: 40 U/L (ref 0–53)
AST: 67 U/L — ABNORMAL HIGH (ref 0–37)
Albumin: 4 g/dL (ref 3.5–5.2)
Alkaline Phosphatase: 60 U/L (ref 39–117)
BUN: 12 mg/dL (ref 6–23)
CO2: 26 mEq/L (ref 19–32)
Calcium: 9.4 mg/dL (ref 8.4–10.5)
Chloride: 100 mEq/L (ref 96–112)
Creatinine, Ser: 1.43 mg/dL (ref 0.40–1.50)
GFR: 60.87 mL/min (ref >60.00)
Glucose, Bld: 94 mg/dL (ref 70–99)
Potassium: 4.7 mEq/L (ref 3.5–5.1)
Sodium: 136 mEq/L (ref 135–145)
Total Bilirubin: 3 mg/dL — ABNORMAL HIGH (ref 0.2–1.2)
Total Protein: 6.8 g/dL (ref 6.0–8.3)

## 2019-03-15 LAB — IBC + FERRITIN
Ferritin: 44.5 ng/mL (ref 22.0–322.0)
Iron: 144 ug/dL (ref 42–165)
Saturation Ratios: 31 % (ref 20.0–50.0)
Transferrin: 332 mg/dL (ref 212.0–360.0)

## 2019-03-17 LAB — HEPATITIS C ANTIBODY
Hepatitis C Ab: NONREACTIVE
SIGNAL TO CUT-OFF: 0.37 (ref <1.00)

## 2019-03-17 LAB — HEPATITIS B SURFACE ANTIGEN: Hepatitis B Surface Ag: NONREACTIVE

## 2019-03-20 ENCOUNTER — Other Ambulatory Visit: Payer: Self-pay | Admitting: Family Medicine

## 2019-03-20 DIAGNOSIS — R7401 Elevation of levels of liver transaminase levels: Secondary | ICD-10-CM

## 2019-04-03 ENCOUNTER — Other Ambulatory Visit: Payer: Self-pay | Admitting: Family Medicine

## 2019-04-03 DIAGNOSIS — E119 Type 2 diabetes mellitus without complications: Secondary | ICD-10-CM

## 2019-04-03 DIAGNOSIS — E785 Hyperlipidemia, unspecified: Secondary | ICD-10-CM

## 2019-04-03 MED FILL — ATORVASTATIN 40 MG TABLET: 40 | 90 days supply | Qty: 90 | Fill #0

## 2019-04-03 MED FILL — ACCU-CHEK GUIDE TEST STRIP: 90 days supply | Qty: 200 | Fill #0

## 2019-04-12 ENCOUNTER — Other Ambulatory Visit: Payer: Self-pay | Admitting: Family Medicine

## 2019-04-12 ENCOUNTER — Encounter: Payer: Self-pay | Admitting: Family Medicine

## 2019-04-12 DIAGNOSIS — R918 Other nonspecific abnormal finding of lung field: Secondary | ICD-10-CM

## 2019-04-12 NOTE — Progress Notes (Signed)
CT

## 2019-04-13 ENCOUNTER — Other Ambulatory Visit: Payer: Self-pay

## 2019-04-13 ENCOUNTER — Ambulatory Visit (HOSPITAL_BASED_OUTPATIENT_CLINIC_OR_DEPARTMENT_OTHER)
Admission: RE | Admit: 2019-04-13 | Discharge: 2019-04-13 | Disposition: A | Discharge status: Home / Self Care | Payer: No Typology Code available for payment source | Source: Ambulatory Visit | Attending: Family Medicine | Admitting: Family Medicine

## 2019-04-13 DIAGNOSIS — R918 Other nonspecific abnormal finding of lung field: Secondary | ICD-10-CM | POA: Diagnosis not present

## 2019-04-24 MED FILL — FARXIGA 10 MG TABLET: 10 | 90 days supply | Qty: 90 | Fill #1

## 2019-05-01 MED FILL — TESTOSTERONE CYP 100 MG/ML: 100 | 28 days supply | Qty: 10 | Fill #0

## 2019-05-10 ENCOUNTER — Encounter (HOSPITAL_BASED_OUTPATIENT_CLINIC_OR_DEPARTMENT_OTHER): Payer: Self-pay | Admitting: *Deleted

## 2019-05-10 ENCOUNTER — Emergency Department (HOSPITAL_BASED_OUTPATIENT_CLINIC_OR_DEPARTMENT_OTHER)
Admission: EM | Admit: 2019-05-10 | Discharge: 2019-05-10 | Disposition: A | Discharge status: Home / Self Care | Payer: No Typology Code available for payment source | Attending: Emergency Medicine | Admitting: Emergency Medicine

## 2019-05-10 ENCOUNTER — Emergency Department (HOSPITAL_BASED_OUTPATIENT_CLINIC_OR_DEPARTMENT_OTHER): Payer: No Typology Code available for payment source

## 2019-05-10 ENCOUNTER — Other Ambulatory Visit: Payer: Self-pay

## 2019-05-10 DIAGNOSIS — Z7982 Long term (current) use of aspirin: Secondary | ICD-10-CM | POA: Diagnosis not present

## 2019-05-10 DIAGNOSIS — I129 Hypertensive chronic kidney disease with stage 1 through stage 4 chronic kidney disease, or unspecified chronic kidney disease: Secondary | ICD-10-CM | POA: Insufficient documentation

## 2019-05-10 DIAGNOSIS — R0789 Other chest pain: Secondary | ICD-10-CM | POA: Diagnosis not present

## 2019-05-10 DIAGNOSIS — N189 Chronic kidney disease, unspecified: Secondary | ICD-10-CM | POA: Insufficient documentation

## 2019-05-10 DIAGNOSIS — Z905 Acquired absence of kidney: Secondary | ICD-10-CM | POA: Insufficient documentation

## 2019-05-10 DIAGNOSIS — Z79899 Other long term (current) drug therapy: Secondary | ICD-10-CM | POA: Diagnosis not present

## 2019-05-10 DIAGNOSIS — E1122 Type 2 diabetes mellitus with diabetic chronic kidney disease: Secondary | ICD-10-CM | POA: Diagnosis not present

## 2019-05-10 LAB — BASIC METABOLIC PANEL
Anion gap: 8 (ref 5–15)
BUN: 18 mg/dL (ref 6–20)
CO2: 28 mmol/L (ref 22–32)
Calcium: 9.3 mg/dL (ref 8.9–10.3)
Chloride: 101 mmol/L (ref 98–111)
Creatinine, Ser: 1.65 mg/dL — ABNORMAL HIGH (ref 0.61–1.24)
GFR calc Af Amer: 52 mL/min — ABNORMAL LOW (ref >60)
GFR calc non Af Amer: 44 mL/min — ABNORMAL LOW (ref >60)
Glucose, Bld: 104 mg/dL — ABNORMAL HIGH (ref 70–99)
Potassium: 4.2 mmol/L (ref 3.5–5.1)
Sodium: 137 mmol/L (ref 135–145)

## 2019-05-10 LAB — CBC
HCT: 51.7 % (ref 39.0–52.0)
Hemoglobin: 16.6 g/dL (ref 13.0–17.0)
MCH: 28 pg (ref 26.0–34.0)
MCHC: 32.1 g/dL (ref 30.0–36.0)
MCV: 87.2 fL (ref 80.0–100.0)
Platelets: 206 10*3/uL (ref 150–400)
RBC: 5.93 MIL/uL — ABNORMAL HIGH (ref 4.22–5.81)
RDW: 13.1 % (ref 11.5–15.5)
WBC: 3.8 10*3/uL — ABNORMAL LOW (ref 4.0–10.5)
nRBC: 0 % (ref 0.0–0.2)

## 2019-05-10 LAB — TROPONIN I (HIGH SENSITIVITY)
Troponin I (High Sensitivity): 13 ng/L (ref <18)
Troponin I (High Sensitivity): 14 ng/L (ref <18)

## 2019-05-10 MED ORDER — NITROGLYCERIN 0.4 MG SL SUBL
0.4000 mg | SUBLINGUAL_TABLET | SUBLINGUAL | Status: DC | PRN
  Administered 2019-05-10: 0.4 mg via SUBLINGUAL
  Filled 2019-05-10: qty 1

## 2019-05-10 MED ORDER — ASPIRIN 81 MG PO CHEW
324.0000 mg | CHEWABLE_TABLET | Freq: Once | ORAL | Status: AC
  Administered 2019-05-10: 324 mg via ORAL
  Filled 2019-05-10: qty 4

## 2019-05-10 NOTE — ED Notes (Signed)
Pt transported to xray 

## 2019-05-10 NOTE — ED Provider Notes (Signed)
[facility] EMERGENCY DEPARTMENT Provider Note   CSN: [id number] Arrival date & time: 05/10/19  1235     History Chief Complaint  Patient presents with  . Chest Pain    Michael Guerra is a 59 y.o. male with history of CKD, diabetes mellitus, hypertension, hyperlipidemia presents for evaluation of acute onset, intermittent left-sided chest pains.  Reports symptoms began at around 11 AM today while sitting at his desk.  The pain is sharp, localized to the left lateral chest, does not radiate.  It worsens with cough, certain movements, and laughter.  It does not worsen with deep inspiration or exertion.  It is not associated with any shortness of breath, diaphoresis, lightheadedness, nausea, vomiting, or abdominal pain.  No fevers or chills.  Has not tried anything for his symptoms.  He is a non-smoker, denies recreational drug use or excessive alcohol use.  No family history of heart disease at a young age.  He denies recent travel or surgeries, hemoptysis, prior history of DVT or PE, leg swelling, or hormone replacement therapy.  The history is provided by the patient.       Past Medical History:  Diagnosis Date  . Blood transfusion without reported diagnosis   . Cataract   . Chronic kidney disease    one kidney that is healthy  . Diabetes mellitus   . Hyperlipidemia   . Hypertension   . Solitary kidney     Patient Active Problem List   Diagnosis Date Noted  . Diabetes mellitus type 2 in nonobese ([place]) 06/26/2016  . LIVER FUNCTION TESTS, ABNORMAL 05/21/2006  . Hyperlipidemia 02/17/2006  . ALLERGIC RHINITIS, SEASONAL 02/17/2006  . INTESTINAL ADHESIONS WITH OBSTRUCTION 02/17/2006  . KELOID 02/17/2006  . NEPHRECTOMY, HX OF 02/17/2006    Past Surgical History:  Procedure Laterality Date  . ABDOMINAL SURGERY    . KIDNEY SURGERY     bowel obstruction  x 3       Family History  Problem Relation Age of Onset  . Cancer Neg Hx   . Colon cancer Neg Hx   .  Esophageal cancer Neg Hx   . Rectal cancer Neg Hx   . Stomach cancer Neg Hx     Social History   Tobacco Use  . Smoking status: Never Smoker  . Smokeless tobacco: Never Used  Substance Use Topics  . Alcohol use: No  . Drug use: No    Home Medications Prior to Admission medications   Medication Sig Start Date End Date Taking? Authorizing Provider  ACCU-CHEK FASTCLIX LANCETS MISC Use as directed twice daily to check blood sugar. 02/16/18   Shelda Pal, DO  ACCU-CHEK GUIDE test strip USE TO CHECK BLOOD SUGAR 2 TIMES DAILY 04/03/19   Nani Ravens, Crosby Oyster, DO  anastrozole (ARIMIDEX) 1 MG tablet Take 1 tablet by mouth 2 (two) times daily. 09/10/16   [provider]  aspirin 81 MG tablet Take 81 mg by mouth daily.      [provider]  atorvastatin (LIPITOR) 40 MG tablet TAKE 1 TABLET (40 MG TOTAL) BY MOUTH DAILY. 04/03/19   Shelda Pal, DO  clomiPHENE (CLOMID) 50 MG tablet Take 1 tablet by mouth daily. 09/10/16   [provider]  dapagliflozin propanediol (FARXIGA) 10 MG TABS tablet Take 10 mg by mouth daily. 11/24/18   Shelda Pal, DO  Multiple Vitamins-Minerals (MULTIVITAMIN ADULT) TABS Take 1 tablet by mouth daily.    [provider]  Omega-3 Fatty Acids ([place]  OIL) 1000 MG CAPS Take 1 capsule by mouth daily.    [provider]  OVER THE COUNTER MEDICATION Vitamin C, one tablet daily.    [provider]  OVER THE COUNTER MEDICATION Vitamin B 12, one tablet daily.    [provider]  OVER THE COUNTER MEDICATION Vitamin E, one tablet daily.    [provider]  OVER THE COUNTER MEDICATION Zinc one tablet daily.    [provider]    Allergies    Lisinopril  Review of Systems   Review of Systems  Constitutional: Negative for chills, diaphoresis and fever.  Respiratory: Negative for cough and shortness of breath.   Cardiovascular: Positive for chest pain. Negative for leg  swelling.  Gastrointestinal: Negative for abdominal pain, nausea and vomiting.  Neurological: Negative for syncope and light-headedness.  All other systems reviewed and are negative.   Physical Exam Updated Vital Signs BP 133/80 (BP Location: Right Arm)   Pulse 76   Temp 98.1 F (36.7 C) (Oral)   Resp 18   Ht 6\' 1"  (1.854 m)   Wt 96.6 kg   SpO2 97%   BMI 28.10 kg/m   Physical Exam Vitals and nursing note reviewed.  Constitutional:      General: He is not in acute distress.    Appearance: He is well-developed.     Comments: Resting comfortably in bed  HENT:     Head: Normocephalic and atraumatic.  Eyes:     General:        Right eye: No discharge.        Left eye: No discharge.     Conjunctiva/sclera: Conjunctivae normal.  Neck:     Vascular: No JVD.     Trachea: No tracheal deviation.  Cardiovascular:     Rate and Rhythm: Normal rate and regular rhythm.     Pulses:          Radial pulses are 2+ on the right side and 2+ on the left side.       Dorsalis pedis pulses are 2+ on the right side and 2+ on the left side.       Posterior tibial pulses are 2+ on the right side and 2+ on the left side.     Comments: Homans sign absent bilaterally, no lower extremity edema, no palpable cords, compartments are soft  Pulmonary:     Effort: Pulmonary effort is normal.  Chest:     Chest wall: No tenderness.  Abdominal:     General: There is no distension.  Musculoskeletal:     Cervical back: Normal range of motion and neck supple.     Right lower leg: No tenderness. No edema.     Left lower leg: No tenderness. No edema.  Skin:    General: Skin is warm and dry.     Findings: No erythema.  Neurological:     Mental Status: He is alert.  Psychiatric:        Behavior: Behavior normal.     ED Results / Procedures / Treatments   Labs (all labs ordered are listed, but only abnormal results are displayed) Labs Reviewed  BASIC METABOLIC PANEL - Abnormal; Notable for the  following components:      Result Value   Glucose, Bld 104 (*)    Creatinine, Ser 1.65 (*)    GFR calc non Af Amer 44 (*)    GFR calc Af Amer 52 (*)    All other components within normal  limits  CBC - Abnormal; Notable for the following components:   WBC 3.8 (*)    RBC 5.93 (*)    All other components within normal limits  TROPONIN I (HIGH SENSITIVITY)  TROPONIN I (HIGH SENSITIVITY)    EKG EKG Interpretation  Date/Time:  Wednesday May 10 2019 12:41:57 EST Ventricular Rate:  85 PR Interval:  170 QRS Duration: 90 QT Interval:  366 QTC Calculation: 435 R Axis:   2 Text Interpretation: Normal sinus rhythm Possible Anterior infarct , age undetermined Abnormal ECG no STEMI Confirmed by Arby Barrette [phone]) on 05/10/2019 12:47:33 PM   Radiology DG Chest 2 View  Result Date: 05/10/2019 CLINICAL DATA:  Left-sided chest pain for 1 hour EXAM: CHEST - 2 VIEW COMPARISON:  04/13/2019 FINDINGS: Cardiac shadow is within normal limits. The lungs are well aerated bilaterally. No focal infiltrate or sizable effusion is seen. No acute bony abnormality is noted. IMPRESSION: No active cardiopulmonary disease. Electronically Signed   By: Alcide Clever M.D.   On: 05/10/2019 13:10    Procedures Procedures (including critical care time)  Medications Ordered in ED Medications  nitroGLYCERIN (NITROSTAT) SL tablet 0.4 mg (0.4 mg Sublingual Given 05/10/19 1453)  aspirin chewable tablet 324 mg (324 mg Oral Given 05/10/19 1408)    ED Course  I have reviewed the triage vital signs and the nursing notes.  Pertinent labs & imaging results that were available during my care of the patient were reviewed by me and considered in my medical decision making (see chart for details).    MDM Rules/Calculators/A&P                      Patient presenting for evaluation of acute onset right lateral chest pains that began while at work.  Pain was not pleuritic, exertional, or reproducible on palpation.  He is  afebrile, initially hypertensive with improvement on reevaluation.  He is nontoxic in appearance.  I was unable to reproduce the pain on palpation today.  His EKG shows normal sinus rhythm, no evidence of MI.  Remainder of lab work reviewed by me shows no leukocytosis, no anemia, no metabolic derangements.  He does have an elevated creatinine but has a history of CKD and this is about his baseline.  Serial troponins are negative and his symptoms sound quite atypical for ACS/MI.  Doubt dissection, cardiac tamponade, PE, esophageal rupture, pneumonia or pneumothorax.  Chest x-ray shows no acute cardiopulmonary abnormalities.  Abdomen is soft and nontender.  On reevaluation the patient is resting comfortably in no apparent distress.  He reports his pain has entirely resolved.  Per current high-sensitivity troponin algorithm it would be reasonable to have the patient follow-up with cardiology and his PCP on an outpatient basis for his symptoms.  Advised patient to avoid ibuprofen due to his history of CKD.  Discussed strict ED return precautions.  Patient verbalized understanding of and agreement with plan and patient is stable for discharge home at this time. Final Clinical Impression(s) / ED Diagnoses Final diagnoses:  Atypical chest pain    Rx / DC Orders ED Discharge Orders         Ordered    Ambulatory referral to Cardiology     05/10/19 [address] 05/10/19 1702    Arby Barrette, MD 05/12/19 [phone]

## 2019-05-10 NOTE — Discharge Instructions (Signed)
Your work-up today was reassuring with no evidence of heart attack.  You can take 1 to 2 tablets of Tylenol every 6 hours as needed for pain.  Drink plenty of fluids and get rest.  I have sent a referral over to our cardiologist here at the [facility] location to reach out and schedule follow-up.  You can also follow-up with your primary care provider for reevaluation of symptoms.  Return to the emergency department if any concerning signs or symptoms develop such as high fevers, persistent vomiting, severe worsening chest pain or shortness of breath, breaking out into a sweat or loss of consciousness.

## 2019-05-10 NOTE — ED Triage Notes (Signed)
Pt c/o left t sided chest pain x 30 mins

## 2019-05-12 ENCOUNTER — Encounter: Payer: Self-pay | Admitting: Cardiology

## 2019-05-12 ENCOUNTER — Ambulatory Visit (INDEPENDENT_AMBULATORY_CARE_PROVIDER_SITE_OTHER): Payer: No Typology Code available for payment source | Admitting: Cardiology

## 2019-05-12 ENCOUNTER — Other Ambulatory Visit: Payer: Self-pay

## 2019-05-12 DIAGNOSIS — I10 Essential (primary) hypertension: Secondary | ICD-10-CM

## 2019-05-12 DIAGNOSIS — R0789 Other chest pain: Secondary | ICD-10-CM

## 2019-05-12 DIAGNOSIS — E088 Diabetes mellitus due to underlying condition with unspecified complications: Secondary | ICD-10-CM

## 2019-05-12 DIAGNOSIS — E782 Mixed hyperlipidemia: Secondary | ICD-10-CM

## 2019-05-12 HISTORY — DX: Other chest pain: R07.89

## 2019-05-12 HISTORY — DX: Essential (primary) hypertension: I10

## 2019-05-12 HISTORY — DX: Diabetes mellitus due to underlying condition with unspecified complications: E08.8

## 2019-05-12 HISTORY — DX: Mixed hyperlipidemia: E78.2

## 2019-05-12 NOTE — Addendum Note (Signed)
Addended by: Pamala Hurry on: 05/12/2019 09:28 AM   Modules accepted: Orders

## 2019-05-12 NOTE — Progress Notes (Signed)
Cardiology Office Note:    Date:  05/12/2019   ID:  [name], DOB Sep 21, 1960, MRN [id number]  PCP:  Sharlene Dory, DO  Cardiologist:  Garwin Brothers, MD   Referring MD: Jeanie Sewer, PA-C    ASSESSMENT:    1. Chest discomfort   2. Essential hypertension   3. Mixed dyslipidemia   4. Diabetes mellitus due to underlying condition with unspecified complications (HCC)    PLAN:    In order of problems listed above:  1. Chest discomfort: Primary prevention stressed with the patient.  Importance of compliance with diet and medication stressed and he vocalized understanding.  In view of multiple risk factors he will have a Lexiscan sestamibi. 2. Essential hypertension: Blood pressure stable 3. Diabetes mellitus and dyslipidemia: Managed by primary care physician.  In view of the above situation I told him to take a coated baby aspirin on a daily basis.  He is on a statin therapy already.  ACE inhibitor should be considered but this will be managed by his primary care physician.  He knows to go to the nearest emergency room for any concerning symptoms.Patient will be seen in follow-up appointment in 3 months or earlier if the patient has any concerns    Medication Adjustments/Labs and Tests Ordered: Current medicines are reviewed at length with the patient today.  Concerns regarding medicines are outlined above.  No orders of the defined types were placed in this encounter.  No orders of the defined types were placed in this encounter.    History of Present Illness:    Michael Guerra is a 59 y.o. male who is being seen today for the evaluation of chest discomfort at the request of Jeanie Sewer, PA-C.  Patient is a pleasant 59 year old male.  He has past medical history of essential hypertension, dyslipidemia, diabetes mellitus and a solitary kidney.  He had 1 surgery for kidney removal when he was a kid.  Patient went to the emergency room with chest discomfort.  He  tells me that he was sitting on the computer table and doing some work and felt a twinge on the left side of the chest therefore he went to the emergency room.  There he was evaluated and discharged.  EKG was unremarkable.  At the time of my evaluation, the patient is alert awake oriented and in no distress.  He exercises very regularly and with exercise he does not develop any symptoms.  Past Medical History:  Diagnosis Date  . Blood transfusion without reported diagnosis   . Cataract   . Chronic kidney disease    one kidney that is healthy  . Diabetes mellitus   . Hyperlipidemia   . Hypertension   . Solitary kidney     Past Surgical History:  Procedure Laterality Date  . ABDOMINAL SURGERY    . KIDNEY SURGERY     bowel obstruction  x 3    Current Medications: Current Meds  Medication Sig  . ACCU-CHEK FASTCLIX LANCETS MISC Use as directed twice daily to check blood sugar.  Marland Kitchen ACCU-CHEK GUIDE test strip USE TO CHECK BLOOD SUGAR 2 TIMES DAILY  . atorvastatin (LIPITOR) 40 MG tablet TAKE 1 TABLET (40 MG TOTAL) BY MOUTH DAILY.  . dapagliflozin propanediol (FARXIGA) 10 MG TABS tablet Take 10 mg by mouth daily.  . Multiple Vitamins-Minerals (MULTIVITAMIN ADULT) TABS Take 1 tablet by mouth daily.  . Omega-3 Fatty Acids (FISH OIL) 1000 MG CAPS Take 1 capsule by  mouth daily.  Marland Kitchen OVER THE COUNTER MEDICATION Vitamin C, one tablet daily.  Marland Kitchen OVER THE COUNTER MEDICATION Vitamin B 12, one tablet daily.  Marland Kitchen OVER THE COUNTER MEDICATION Vitamin E, one tablet daily.  Marland Kitchen OVER THE COUNTER MEDICATION Zinc one tablet daily.  Marland Kitchen testosterone cypionate (DEPOTESTOTERONE CYPIONATE) 100 MG/ML injection    Current Facility-Administered Medications for the 05/12/19 encounter (Office Visit) with Leverett Camplin, Reita Cliche, MD  Medication  . 0.9 %  sodium chloride infusion     Allergies:   Lisinopril   Social History   Socioeconomic History  . Marital status: Married    Spouse name: Not on file  . Number of  children: Not on file  . Years of education: Not on file  . Highest education level: Not on file  Occupational History  . Not on file  Tobacco Use  . Smoking status: Never Smoker  . Smokeless tobacco: Never Used  Substance and Sexual Activity  . Alcohol use: No  . Drug use: No  . Sexual activity: Not on file  Other Topics Concern  . Not on file  Social History Narrative  . Not on file   Social Determinants of Health   Financial Resource Strain:   . Difficulty of Paying Living Expenses: Not on file  Food Insecurity:   . Worried About Charity fundraiser in the Last Year: Not on file  . Ran Out of Food in the Last Year: Not on file  Transportation Needs:   . Lack of Transportation (Medical): Not on file  . Lack of Transportation (Non-Medical): Not on file  Physical Activity:   . Days of Exercise per Week: Not on file  . Minutes of Exercise per Session: Not on file  Stress:   . Feeling of Stress : Not on file  Social Connections:   . Frequency of Communication with Friends and Family: Not on file  . Frequency of Social Gatherings with Friends and Family: Not on file  . Attends Religious Services: Not on file  . Active Member of Clubs or Organizations: Not on file  . Attends Archivist Meetings: Not on file  . Marital Status: Not on file     Family History: The patient's family history is negative for Cancer, Colon cancer, Esophageal cancer, Rectal cancer, and Stomach cancer.  ROS:   Please see the history of present illness.    All other systems reviewed and are negative.  EKGs/Labs/Other Studies Reviewed:    The following studies were reviewed today: EKG reveals sinus rhythm and poor anterior forces   Recent Labs: 03/15/2019: ALT 40 05/10/2019: BUN 18; Creatinine, Ser 1.65; Hemoglobin 16.6; Platelets 206; Potassium 4.2; Sodium 137  Recent Lipid Panel    Component Value Date/Time   CHOL 100 03/08/2019 0727   TRIG 118.0 03/08/2019 0727   HDL 36.70  (L) 03/08/2019 0727   CHOLHDL 3 03/08/2019 0727   VLDL 23.6 03/08/2019 0727   LDLCALC 40 03/08/2019 0727   LDLDIRECT 97.0 10/18/2017 0941    Physical Exam:    VS:  BP (!) 142/88   Pulse 83   Ht 6\' 1"  (1.854 m)   Wt 222 lb (100.7 kg)   SpO2 99%   BMI 29.29 kg/m     Wt Readings from Last 3 Encounters:  05/12/19 222 lb (100.7 kg)  05/10/19 213 lb (96.6 kg)  03/08/19 225 lb 2 oz (102.1 kg)     GEN: Patient is in no acute distress HEENT: Normal NECK:  No JVD; No carotid bruits LYMPHATICS: No lymphadenopathy CARDIAC: S1 S2 regular, 2/6 systolic murmur at the apex. RESPIRATORY:  Clear to auscultation without rales, wheezing or rhonchi  ABDOMEN: Soft, non-tender, non-distended MUSCULOSKELETAL:  No edema; No deformity  SKIN: Warm and dry NEUROLOGIC:  Alert and oriented x 3 PSYCHIATRIC:  Normal affect    Signed, Garwin Brothers, MD  05/12/2019 9:13 AM    [facility]

## 2019-05-12 NOTE — Patient Instructions (Addendum)
Medication Instructions:  Your physician recommends that you continue on your current medications as directed. Please refer to the Current Medication list given to you today.    If you need a refill on your cardiac medications before your next appointment, please call your pharmacy*  Lab Work: NONE If you have labs (blood work) drawn today and your tests are completely normal, you will receive your results only by: Marland Kitchen MyChart Message (if you have MyChart) OR . A paper copy in the mail If you have any lab test that is abnormal or we need to change your treatment, we will call you to review the results.  Testing/Procedures: Your physician has requested that you have a lexiscan myoview. For further information please visit [email or web address]. Please follow instruction sheet, as given.   Follow-Up: At [facility], you and your health needs are our priority.  As part of our continuing mission to provide you with exceptional heart care, we have created designated Provider Care Teams.  These Care Teams include your primary Cardiologist (physician) and Advanced Practice Providers (APPs -  Physician Assistants and Nurse Practitioners) who all work together to provide you with the care you need, when you need it.  Your next appointment:   3 month(s)  The format for your next appointment:   In Person  Provider:   Jyl Heinz, MD  Other Instructions  Aspirin and Your Heart  Aspirin is a medicine that prevents the cells in the blood that are used for clotting, called platelets, from sticking together. Aspirin can be used to help reduce the risk of blood clots, heart attacks, and other heart-related problems. Can I take aspirin? Your health care provider will help you determine whether it is safe and beneficial for you to take aspirin daily. Taking aspirin daily may be helpful if you:  Have had a heart attack or chest pain.  Are at risk for a heart attack.  Have undergone open-heart  surgery, such as coronary artery bypass surgery (CABG).  Have had coronary angioplasty or a stent.  Have had certain types of stroke or transient ischemic attack (TIA).  Have peripheral artery disease (PAD).  Have chronic heart rhythm problems such as atrial fibrillation and cannot take an anticoagulant.  Have valve disease or have had surgery on a valve. What are the risks? Daily use of aspirin can cause side effects. Some of these include:  Bleeding. Bleeding problems can be minor or serious. An example of a minor problem is a cut that does not stop bleeding. An example of a more serious problem is stomach bleeding or, rarely, bleeding into the brain. Your risk of bleeding is increased if you are also taking non-steroidal anti-inflammatory drugs (NSAIDs).  Increased bruising.  Upset stomach.  An allergic reaction. People who have nasal polyps have an increased risk of developing an aspirin allergy. General guidelines  Take aspirin only as told by your health care provider. Make sure that you understand how much you should take and what form you should take. The two forms of aspirin are: ? Non-enteric-coated.This type of aspirin does not have a coating and is absorbed quickly. This type of aspirin also comes in a chewable form. ? Enteric-coated. This type of aspirin has a coating that releases the medicine very slowly. Enteric-coated aspirin might cause less stomach upset than non-enteric-coated aspirin. This type of aspirin should not be chewed or crushed.  Limit alcohol intake to no more than 1 drink a day for nonpregnant women and 2 drinks  a day for men. Drinking alcohol increases your risk of bleeding. One drink equals 12 oz of beer, 5 oz of wine, or 1 oz of hard liquor. Contact a health care provider if you:  Have unusual bleeding or bruising.  Have stomach pain or nausea.  Have ringing in your ears.  Have an allergic reaction that causes: ? Hives. ? Itchy  skin. ? Swelling of the lips, tongue, or face. Get help right away if you:  Notice that your bowel movements are bloody, dark red, or black in color.  Vomit or cough up blood.  Have blood in your urine.  Cough, have noisy breathing (wheeze), or feel short of breath.  Have chest pain, especially if the pain spreads to the arms, back, neck, or jaw.  Have a severe headache, or a headache with confusion, or dizziness. These symptoms may represent a serious problem that is an emergency. Do not wait to see if the symptoms will go away. Get medical help right away. Call your local emergency services (911 in the [place] Do not drive yourself to the hospital. Summary  Aspirin can be used to help reduce the risk of blood clots, heart attacks, and other heart-related problems.  Daily use of aspirin can increase your risk of side effects. Your health care provider will help you determine whether it is safe and beneficial for you to take aspirin daily.  Take aspirin only as told by your health care provider. Make sure that you understand how much you can take and what form you can take. This information is not intended to replace advice given to you by your health care provider. Make sure you discuss any questions you have with your health care provider. Document Revised: 02/04/2017 Document Reviewed: 02/04/2017 Elsevier Patient Education  2020 Elsevier Inc.  Regadenoson injection What is this medicine? REGADENOSON is used to test the heart for coronary artery disease. It is used in patients who can not exercise for their stress test. This medicine may be used for other purposes; ask your health care provider or pharmacist if you have questions. COMMON BRAND NAME(S): Lexiscan What should I tell my health care provider before I take this medicine? They need to know if you have any of these conditions:  heart problems  lung or breathing disease, like asthma or COPD  an unusual or allergic  reaction to regadenoson, other medicines, foods, dyes, or preservatives  pregnant or trying to get pregnant  breast-feeding How should I use this medicine? This medicine is for injection into a vein. It is given by a health care professional in a hospital or clinic setting. Talk to your pediatrician regarding the use of this medicine in children. Special care may be needed. Overdosage: If you think you have taken too much of this medicine contact a poison control center or emergency room at once. NOTE: This medicine is only for you. Do not share this medicine with others. What if I miss a dose? This does not apply. What may interact with this medicine?  caffeine  dipyridamole  guarana  theophylline This list may not describe all possible interactions. Give your health care provider a list of all the medicines, herbs, non-prescription drugs, or dietary supplements you use. Also tell them if you smoke, drink alcohol, or use illegal drugs. Some items may interact with your medicine. What should I watch for while using this medicine? Your condition will be monitored carefully while you are receiving this medicine. Do not take medicines, foods,  or drinks with caffeine (like coffee, tea, or colas) for at least 12 hours before your test. If you do not know if something contains caffeine, ask your health care professional. What side effects may I notice from receiving this medicine? Side effects that you should report to your doctor or health care professional as soon as possible:  allergic reactions like skin rash, itching or hives, swelling of the face, lips, or tongue  breathing problems  chest pain, tightness or palpitations  severe headache Side effects that usually do not require medical attention (report to your doctor or health care professional if they continue or are bothersome):  flushing  headache  irritation or pain at site where injected  nausea, vomiting This list  may not describe all possible side effects. Call your doctor for medical advice about side effects. You may report side effects to FDA at 1-800-FDA-1088. Where should I keep my medicine? This drug is given in a hospital or clinic and will not be stored at home. NOTE: This sheet is a summary. It may not cover all possible information. If you have questions about this medicine, talk to your doctor, pharmacist, or health care provider.  2020 Elsevier/Gold Standard (2007-12-05 15:08:13)  Cardiac Nuclear Scan A cardiac nuclear scan is a test that measures blood flow to the heart when a person is resting and when he or she is exercising. The test looks for problems such as:  Not enough blood reaching a portion of the heart.  The heart muscle not working normally. You may need this test if:  You have heart disease.  You have had abnormal lab results.  You have had heart surgery or a balloon procedure to open up blocked arteries (angioplasty).  You have chest pain.  You have shortness of breath. In this test, a radioactive dye (tracer) is injected into your bloodstream. After the tracer has traveled to your heart, an imaging device is used to measure how much of the tracer is absorbed by or distributed to various areas of your heart. This procedure is usually done at a hospital and takes 2-4 hours. Tell a health care provider about:  Any allergies you have.  All medicines you are taking, including vitamins, herbs, eye drops, creams, and over-the-counter medicines.  Any problems you or family members have had with anesthetic medicines.  Any blood disorders you have.  Any surgeries you have had.  Any medical conditions you have.  Whether you are pregnant or may be pregnant. What are the risks? Generally, this is a safe procedure. However, problems may occur, including:  Serious chest pain and heart attack. This is only a risk if the stress portion of the test is done.  Rapid  heartbeat.  Sensation of warmth in your chest. This usually passes quickly.  Allergic reaction to the tracer. What happens before the procedure?  Ask your health care provider about changing or stopping your regular medicines. This is especially important if you are taking diabetes medicines or blood thinners.  Follow instructions from your health care provider about eating or drinking restrictions.  Remove your jewelry on the day of the procedure. What happens during the procedure?  An IV will be inserted into one of your veins.  Your health care provider will inject a small amount of radioactive tracer through the IV.  You will wait for 20-40 minutes while the tracer travels through your bloodstream.  Your heart activity will be monitored with an electrocardiogram (ECG).  You will lie  down on an exam table.  Images of your heart will be taken for about 15-20 minutes.  You may also have a stress test. For this test, one of the following may be done: ? You will exercise on a treadmill or stationary bike. While you exercise, your heart's activity will be monitored with an ECG, and your blood pressure will be checked. ? You will be given medicines that will increase blood flow to parts of your heart. This is done if you are unable to exercise.  When blood flow to your heart has peaked, a tracer will again be injected through the IV.  After 20-40 minutes, you will get back on the exam table and have more images taken of your heart.  Depending on the type of tracer used, scans may need to be repeated 3-4 hours later.  Your IV line will be removed when the procedure is over. The procedure may vary among health care providers and hospitals. What happens after the procedure?  Unless your health care provider tells you otherwise, you may return to your normal schedule, including diet, activities, and medicines.  Unless your health care provider tells you otherwise, you may increase  your fluid intake. This will help to flush the contrast dye from your body. Drink enough fluid to keep your urine pale yellow.  Ask your health care provider, or the department that is doing the test: ? When will my results be ready? ? How will I get my results? Summary  A cardiac nuclear scan measures the blood flow to the heart when a person is resting and when he or she is exercising.  Tell your health care provider if you are pregnant.  Before the procedure, ask your health care provider about changing or stopping your regular medicines. This is especially important if you are taking diabetes medicines or blood thinners.  After the procedure, unless your health care provider tells you otherwise, increase your fluid intake. This will help flush the contrast dye from your body.  After the procedure, unless your health care provider tells you otherwise, you may return to your normal schedule, including diet, activities, and medicines. This information is not intended to replace advice given to you by your health care provider. Make sure you discuss any questions you have with your health care provider. Document Revised: 09/20/2017 Document Reviewed: 09/20/2017 Elsevier Patient Education  2020 [employer].

## 2019-05-15 ENCOUNTER — Telehealth (HOSPITAL_COMMUNITY): Payer: Self-pay | Admitting: *Deleted

## 2019-05-15 NOTE — Telephone Encounter (Signed)
Patient given detailed instructions per Myocardial Perfusion Study Information Sheet for the test on 05/17/19. Patient notified to arrive 15 minutes early and that it is imperative to arrive on time for appointment to keep from having the test rescheduled.  If you need to cancel or reschedule your appointment, please call the office within 24 hours of your appointment. . Patient verbalized understanding. Miyeko Mahlum Jacqueline    

## 2019-05-17 ENCOUNTER — Other Ambulatory Visit: Payer: Self-pay

## 2019-05-17 ENCOUNTER — Ambulatory Visit (HOSPITAL_COMMUNITY): Payer: No Typology Code available for payment source | Attending: Internal Medicine

## 2019-05-17 VITALS — Ht 73.0 in | Wt 222.0 lb

## 2019-05-17 DIAGNOSIS — E782 Mixed hyperlipidemia: Secondary | ICD-10-CM | POA: Diagnosis present

## 2019-05-17 DIAGNOSIS — I10 Essential (primary) hypertension: Secondary | ICD-10-CM | POA: Diagnosis not present

## 2019-05-17 DIAGNOSIS — R0789 Other chest pain: Secondary | ICD-10-CM | POA: Diagnosis not present

## 2019-05-17 LAB — MYOCARDIAL PERFUSION IMAGING
LV dias vol: 170 mL (ref 62–150)
LV sys vol: 130 mL
Peak HR: 106 {beats}/min
Rest HR: 76 {beats}/min
SDS: 4
SRS: 0
SSS: 4
TID: 1.02

## 2019-05-17 MED ORDER — TECHNETIUM TC 99M TETROFOSMIN IV KIT
31.2000 | PACK | Freq: Once | INTRAVENOUS | Status: AC | PRN
  Administered 2019-05-17: 31.2 via INTRAVENOUS
  Filled 2019-05-17: qty 32

## 2019-05-17 MED ORDER — TECHNETIUM TC 99M TETROFOSMIN IV KIT
9.7000 | PACK | Freq: Once | INTRAVENOUS | Status: AC | PRN
  Administered 2019-05-17: 9.7 via INTRAVENOUS
  Filled 2019-05-17: qty 10

## 2019-05-17 MED ORDER — REGADENOSON 0.4 MG/5ML IV SOLN
0.4000 mg | Freq: Once | INTRAVENOUS | Status: AC
  Administered 2019-05-17: 09:00:00 0.4 mg via INTRAVENOUS

## 2019-05-18 ENCOUNTER — Telehealth: Payer: Self-pay | Admitting: Emergency Medicine

## 2019-05-18 DIAGNOSIS — R9439 Abnormal result of other cardiovascular function study: Secondary | ICD-10-CM

## 2019-05-18 NOTE — Telephone Encounter (Signed)
Called patient and informed him of lexiscan results. Scheduled him for a follow up appointment with Dr. Tomie China on Monday. Advised him Dr. Tomie China was recommending a echo before the appointment. I have sent order form to [facility], Dr. Tomie China will need to sign and then registration can work on getting it scheduled. The patient is aware of this and will wait to hear when his appointment at [facility] will be for the echocardiogram. Results routed to patient's pcp.

## 2019-05-22 ENCOUNTER — Ambulatory Visit: Payer: No Typology Code available for payment source | Admitting: Cardiology

## 2019-05-22 MED FILL — SILDENAFIL CITRATE 20 MG TA: 20 | 30 days supply | Qty: 50 | Fill #2

## 2019-05-23 ENCOUNTER — Ambulatory Visit (HOSPITAL_BASED_OUTPATIENT_CLINIC_OR_DEPARTMENT_OTHER)
Admission: RE | Admit: 2019-05-23 | Discharge: 2019-05-23 | Disposition: A | Discharge status: Home / Self Care | Payer: No Typology Code available for payment source | Source: Ambulatory Visit | Attending: Cardiology | Admitting: Cardiology

## 2019-05-23 ENCOUNTER — Ambulatory Visit (INDEPENDENT_AMBULATORY_CARE_PROVIDER_SITE_OTHER): Payer: No Typology Code available for payment source | Admitting: Cardiology

## 2019-05-23 ENCOUNTER — Other Ambulatory Visit: Payer: Self-pay

## 2019-05-23 ENCOUNTER — Encounter: Payer: Self-pay | Admitting: Cardiology

## 2019-05-23 VITALS — BP 162/98 | HR 90 | Ht 73.0 in | Wt 226.0 lb

## 2019-05-23 DIAGNOSIS — E782 Mixed hyperlipidemia: Secondary | ICD-10-CM

## 2019-05-23 DIAGNOSIS — I429 Cardiomyopathy, unspecified: Secondary | ICD-10-CM

## 2019-05-23 DIAGNOSIS — R0789 Other chest pain: Secondary | ICD-10-CM | POA: Diagnosis not present

## 2019-05-23 DIAGNOSIS — I10 Essential (primary) hypertension: Secondary | ICD-10-CM

## 2019-05-23 DIAGNOSIS — Q600 Renal agenesis, unilateral: Secondary | ICD-10-CM

## 2019-05-23 DIAGNOSIS — N289 Disorder of kidney and ureter, unspecified: Secondary | ICD-10-CM | POA: Insufficient documentation

## 2019-05-23 DIAGNOSIS — E088 Diabetes mellitus due to underlying condition with unspecified complications: Secondary | ICD-10-CM

## 2019-05-23 DIAGNOSIS — R9439 Abnormal result of other cardiovascular function study: Secondary | ICD-10-CM | POA: Insufficient documentation

## 2019-05-23 DIAGNOSIS — IMO0002 Reserved for concepts with insufficient information to code with codable children: Secondary | ICD-10-CM

## 2019-05-23 HISTORY — DX: Disorder of kidney and ureter, unspecified: N28.9

## 2019-05-23 HISTORY — DX: Cardiomyopathy, unspecified: I42.9

## 2019-05-23 NOTE — H&P (View-Only) (Signed)
Cardiology Office Note:    Date:  05/23/2019   ID:  Michael Guerra, DOB 04-21-1960, MRN [id number]  PCP:  Shelda Pal, DO  Cardiologist:  Jenean Lindau, MD   Referring MD: Shelda Pal*    ASSESSMENT:    1. Essential hypertension   2. Cardiomyopathy, unspecified type ([place])   3. Chest discomfort   4. Mixed dyslipidemia   5. Diabetes mellitus due to underlying condition with unspecified complications ([place])   6. Solitary kidney   7. Renal insufficiency    PLAN:    In order of problems listed above:  1. Cardiomyopathy: I discussed my findings with the patient at extensive length.  Echocardiogram and stress test findings were discussed and questions were answered to his satisfaction.  He does have some chest tightness at times.  At any rate I think this will need to be evaluated further especially to assess coronary artery disease.  I understand that he has significant renal insufficiency and a solitary kidney so I would prefer to send him to coronary angiography with only coronary arteriography and if possible prefer not to have a left ventriculography.  Finally I will refer to the decision of our interventional colleagues to assess this during the procedure.  I discussed with the patient at extensive length the same.I discussed coronary angiography and left heart catheterization with the patient at extensive length. Procedure, benefits and potential risks were explained. Patient had multiple questions which were answered to the patient's satisfaction. Patient agreed and consented for the procedure. Further recommendations will be made based on the findings of the coronary angiography. In the interim. The patient has any significant symptoms he knows to go to the nearest emergency room.  I mentioned to him that his risks of coronary angiography and dye induced renal dysfunction are elevated and he understands.  We will do our best to help him reduce those risks.  I have  asked him to keep himself well-hydrated the day before the test and after the coronary angiography.  Also our interventional colleagues can use much as such as IV hydration and possibly bicarb therapy to reduce the risks again I would leave this to the protocol of the Cath Lab. 2. Essential hypertension: Blood pressure is elevated but he checks his blood pressure at home regularly and tells me that it is fine and he told me the numbers so we will not change any of his medications.  Further recommendations will be made based on the findings of the coronary angiography.  Patient had multiple questions which were answered to satisfaction.  Total time for this evaluation was 35 minutes.   Medication Adjustments/Labs and Tests Ordered: Current medicines are reviewed at length with the patient today.  Concerns regarding medicines are outlined above.  No orders of the defined types were placed in this encounter.  No orders of the defined types were placed in this encounter.    No chief complaint on file.    History of Present Illness:    Michael Guerra is a 59 y.o. male.  Patient has past medical history of essential hypertension and dyslipidemia.  He was evaluated for chest discomfort.  His stress test came markedly abnormal and the details are mentioned below.  Echocardiogram was followed to confirm and it revealed significantly depressed ejection fraction.  The patient is unaware of any cardiac history.  He denies any history of heart attack or any such problems in the past.  This findings have come as  a surprise to [place] and to him.  He is here for follow-up for the same reason.  Past Medical History:  Diagnosis Date  . Blood transfusion without reported diagnosis   . Cataract   . Chronic kidney disease    one kidney that is healthy  . Diabetes mellitus   . Hyperlipidemia   . Hypertension   . Renal insufficiency 05/23/2019  . Solitary kidney     Past Surgical History:  Procedure Laterality  Date  . ABDOMINAL SURGERY    . KIDNEY SURGERY     bowel obstruction  x 3    Current Medications: Current Meds  Medication Sig  . ACCU-CHEK FASTCLIX LANCETS MISC Use as directed twice daily to check blood sugar.  Marland Kitchen ACCU-CHEK GUIDE test strip USE TO CHECK BLOOD SUGAR 2 TIMES DAILY  . aspirin 81 MG tablet Take 81 mg by mouth daily.    Marland Kitchen atorvastatin (LIPITOR) 40 MG tablet TAKE 1 TABLET (40 MG TOTAL) BY MOUTH DAILY.  . dapagliflozin propanediol (FARXIGA) 10 MG TABS tablet Take 10 mg by mouth daily.  . Multiple Vitamins-Minerals (MULTIVITAMIN ADULT) TABS Take 1 tablet by mouth daily.  . Omega-3 Fatty Acids (FISH OIL) 1000 MG CAPS Take 1 capsule by mouth daily.  Marland Kitchen OVER THE COUNTER MEDICATION Vitamin C, one tablet daily.  Marland Kitchen OVER THE COUNTER MEDICATION Vitamin B 12, one tablet daily.  Marland Kitchen OVER THE COUNTER MEDICATION Vitamin E, one tablet daily.  Marland Kitchen OVER THE COUNTER MEDICATION Zinc one tablet daily.  . sildenafil (REVATIO) 20 MG tablet   . testosterone cypionate (DEPOTESTOTERONE CYPIONATE) 100 MG/ML injection    Current Facility-Administered Medications for the 05/23/19 encounter (Office Visit) with Catlin Doria, Aundra Dubin, MD  Medication  . 0.9 %  sodium chloride infusion     Allergies:   Lisinopril   Social History   Socioeconomic History  . Marital status: Married    Spouse name: Not on file  . Number of children: Not on file  . Years of education: Not on file  . Highest education level: Not on file  Occupational History  . Not on file  Tobacco Use  . Smoking status: Never Smoker  . Smokeless tobacco: Never Used  Substance and Sexual Activity  . Alcohol use: No  . Drug use: No  . Sexual activity: Not on file  Other Topics Concern  . Not on file  Social History Narrative  . Not on file   Social Determinants of Health   Financial Resource Strain:   . Difficulty of Paying Living Expenses: Not on file  Food Insecurity:   . Worried About Programme researcher, broadcasting/film/video in the Last Year: Not on  file  . Ran Out of Food in the Last Year: Not on file  Transportation Needs:   . Lack of Transportation (Medical): Not on file  . Lack of Transportation (Non-Medical): Not on file  Physical Activity:   . Days of Exercise per Week: Not on file  . Minutes of Exercise per Session: Not on file  Stress:   . Feeling of Stress : Not on file  Social Connections:   . Frequency of Communication with Friends and Family: Not on file  . Frequency of Social Gatherings with Friends and Family: Not on file  . Attends Religious Services: Not on file  . Active Member of Clubs or Organizations: Not on file  . Attends Banker Meetings: Not on file  . Marital Status: Not on file  Family History: The patient's family history is negative for Cancer, Colon cancer, Esophageal cancer, Rectal cancer, and Stomach cancer.  ROS:   Please see the history of present illness.    All other systems reviewed and are negative.  EKGs/Labs/Other Studies Reviewed:    The following studies were reviewed today: Study Highlights    The left ventricular ejection fraction is severely decreased (<30%).  Nuclear stress EF: 23%.  No T wave inversion was noted during stress.  There was no ST segment deviation noted during stress.  Defect 1: There is a medium defect of moderate severity present in the mid inferolateral and apical inferior location.  This is a high risk study.   No reversible ischemia. Medium size, moderate intensity fixed inferior attenuation artifact (worse at rest than stress). Moderately dilated LV with severe global hypokinesis - LVEF 23%. This is a high risk study. Echocardiographic and clinical correlation is recommended.    IMPRESSIONS    1. Left ventricular ejection fraction, by visual estimation, is 35 to  40%. The left ventricle has moderately decreased function with global  hypokinesis. The left ventricle cavity is mildy dilated. There is no left  ventricular  hypertrophy.  2. Abnormal left ventricular global longitudinal strain (-11.1 %).  3. Left ventricular diastolic parameters are consistent with Grade I  diastolic dysfunction (impaired relaxation).  4. Global right ventricle has normal systolic function.The right  ventricular size is normal. No increase in right ventricular wall  thickness.  5. Left atrial size was normal.  6. Right atrial size was normal.  7. The mitral valve is normal in structure. Mild mitral valve  regurgitation. No evidence of mitral stenosis.  8. The tricuspid valve is normal in structure. Tricuspid valve  regurgitation is mild.  9. The aortic valve is normal in structure. Aortic valve regurgitation is  not visualized. No evidence of aortic valve sclerosis or stenosis.  10. The pulmonic valve was not well visualized. Pulmonic valve  regurgitation is not visualized.  11. Normal pulmonary artery systolic pressure.  12. No prior study for comparison.    Recent Labs: 03/15/2019: ALT 40 05/10/2019: BUN 18; Creatinine, Ser 1.65; Hemoglobin 16.6; Platelets 206; Potassium 4.2; Sodium 137  Recent Lipid Panel    Component Value Date/Time   CHOL 100 03/08/2019 0727   TRIG 118.0 03/08/2019 0727   HDL 36.70 (L) 03/08/2019 0727   CHOLHDL 3 03/08/2019 0727   VLDL 23.6 03/08/2019 0727   LDLCALC 40 03/08/2019 0727   LDLDIRECT 97.0 10/18/2017 0941    Physical Exam:    VS:  BP (!) 162/98   Pulse 90   Ht 6' 1" (1.854 m)   Wt 226 lb (102.5 kg)   SpO2 97%   BMI 29.82 kg/m     Wt Readings from Last 3 Encounters:  05/23/19 226 lb (102.5 kg)  05/17/19 222 lb (100.7 kg)  05/12/19 222 lb (100.7 kg)     GEN: Patient is in no acute distress HEENT: Normal NECK: No JVD; No carotid bruits LYMPHATICS: No lymphadenopathy CARDIAC: Hear sounds regular, 2/6 systolic murmur at the apex. RESPIRATORY:  Clear to auscultation without rales, wheezing or rhonchi  ABDOMEN: Soft, non-tender, non-distended MUSCULOSKELETAL:   No edema; No deformity  SKIN: Warm and dry NEUROLOGIC:  Alert and oriented x 3 PSYCHIATRIC:  Normal affect   Signed, Meriam Chojnowski R Manpreet Kemmer, MD  05/23/2019 4:05 PM    [facility]  

## 2019-05-23 NOTE — Progress Notes (Signed)
Cardiology Office Note:    Date:  05/23/2019   ID:  Michael Guerra, DOB 04-21-1960, MRN [id number]  PCP:  Shelda Pal, DO  Cardiologist:  Jenean Lindau, MD   Referring MD: Shelda Pal*    ASSESSMENT:    1. Essential hypertension   2. Cardiomyopathy, unspecified type ([place])   3. Chest discomfort   4. Mixed dyslipidemia   5. Diabetes mellitus due to underlying condition with unspecified complications ([place])   6. Solitary kidney   7. Renal insufficiency    PLAN:    In order of problems listed above:  1. Cardiomyopathy: I discussed my findings with the patient at extensive length.  Echocardiogram and stress test findings were discussed and questions were answered to his satisfaction.  He does have some chest tightness at times.  At any rate I think this will need to be evaluated further especially to assess coronary artery disease.  I understand that he has significant renal insufficiency and a solitary kidney so I would prefer to send him to coronary angiography with only coronary arteriography and if possible prefer not to have a left ventriculography.  Finally I will refer to the decision of our interventional colleagues to assess this during the procedure.  I discussed with the patient at extensive length the same.I discussed coronary angiography and left heart catheterization with the patient at extensive length. Procedure, benefits and potential risks were explained. Patient had multiple questions which were answered to the patient's satisfaction. Patient agreed and consented for the procedure. Further recommendations will be made based on the findings of the coronary angiography. In the interim. The patient has any significant symptoms he knows to go to the nearest emergency room.  I mentioned to him that his risks of coronary angiography and dye induced renal dysfunction are elevated and he understands.  We will do our best to help him reduce those risks.  I have  asked him to keep himself well-hydrated the day before the test and after the coronary angiography.  Also our interventional colleagues can use much as such as IV hydration and possibly bicarb therapy to reduce the risks again I would leave this to the protocol of the Cath Lab. 2. Essential hypertension: Blood pressure is elevated but he checks his blood pressure at home regularly and tells me that it is fine and he told me the numbers so we will not change any of his medications.  Further recommendations will be made based on the findings of the coronary angiography.  Patient had multiple questions which were answered to satisfaction.  Total time for this evaluation was 35 minutes.   Medication Adjustments/Labs and Tests Ordered: Current medicines are reviewed at length with the patient today.  Concerns regarding medicines are outlined above.  No orders of the defined types were placed in this encounter.  No orders of the defined types were placed in this encounter.    No chief complaint on file.    History of Present Illness:    Michael Guerra is a 59 y.o. male.  Patient has past medical history of essential hypertension and dyslipidemia.  He was evaluated for chest discomfort.  His stress test came markedly abnormal and the details are mentioned below.  Echocardiogram was followed to confirm and it revealed significantly depressed ejection fraction.  The patient is unaware of any cardiac history.  He denies any history of heart attack or any such problems in the past.  This findings have come as  a surprise to [place] and to him.  He is here for follow-up for the same reason.  Past Medical History:  Diagnosis Date  . Blood transfusion without reported diagnosis   . Cataract   . Chronic kidney disease    one kidney that is healthy  . Diabetes mellitus   . Hyperlipidemia   . Hypertension   . Renal insufficiency 05/23/2019  . Solitary kidney     Past Surgical History:  Procedure Laterality  Date  . ABDOMINAL SURGERY    . KIDNEY SURGERY     bowel obstruction  x 3    Current Medications: Current Meds  Medication Sig  . ACCU-CHEK FASTCLIX LANCETS MISC Use as directed twice daily to check blood sugar.  Marland Kitchen ACCU-CHEK GUIDE test strip USE TO CHECK BLOOD SUGAR 2 TIMES DAILY  . aspirin 81 MG tablet Take 81 mg by mouth daily.    Marland Kitchen atorvastatin (LIPITOR) 40 MG tablet TAKE 1 TABLET (40 MG TOTAL) BY MOUTH DAILY.  . dapagliflozin propanediol (FARXIGA) 10 MG TABS tablet Take 10 mg by mouth daily.  . Multiple Vitamins-Minerals (MULTIVITAMIN ADULT) TABS Take 1 tablet by mouth daily.  . Omega-3 Fatty Acids (FISH OIL) 1000 MG CAPS Take 1 capsule by mouth daily.  Marland Kitchen OVER THE COUNTER MEDICATION Vitamin C, one tablet daily.  Marland Kitchen OVER THE COUNTER MEDICATION Vitamin B 12, one tablet daily.  Marland Kitchen OVER THE COUNTER MEDICATION Vitamin E, one tablet daily.  Marland Kitchen OVER THE COUNTER MEDICATION Zinc one tablet daily.  . sildenafil (REVATIO) 20 MG tablet   . testosterone cypionate (DEPOTESTOTERONE CYPIONATE) 100 MG/ML injection    Current Facility-Administered Medications for the 05/23/19 encounter (Office Visit) with Rosan Calbert, Aundra Dubin, MD  Medication  . 0.9 %  sodium chloride infusion     Allergies:   Lisinopril   Social History   Socioeconomic History  . Marital status: Married    Spouse name: Not on file  . Number of children: Not on file  . Years of education: Not on file  . Highest education level: Not on file  Occupational History  . Not on file  Tobacco Use  . Smoking status: Never Smoker  . Smokeless tobacco: Never Used  Substance and Sexual Activity  . Alcohol use: No  . Drug use: No  . Sexual activity: Not on file  Other Topics Concern  . Not on file  Social History Narrative  . Not on file   Social Determinants of Health   Financial Resource Strain:   . Difficulty of Paying Living Expenses: Not on file  Food Insecurity:   . Worried About Programme researcher, broadcasting/film/video in the Last Year: Not on  file  . Ran Out of Food in the Last Year: Not on file  Transportation Needs:   . Lack of Transportation (Medical): Not on file  . Lack of Transportation (Non-Medical): Not on file  Physical Activity:   . Days of Exercise per Week: Not on file  . Minutes of Exercise per Session: Not on file  Stress:   . Feeling of Stress : Not on file  Social Connections:   . Frequency of Communication with Friends and Family: Not on file  . Frequency of Social Gatherings with Friends and Family: Not on file  . Attends Religious Services: Not on file  . Active Member of Clubs or Organizations: Not on file  . Attends Banker Meetings: Not on file  . Marital Status: Not on file  Family History: The patient's family history is negative for Cancer, Colon cancer, Esophageal cancer, Rectal cancer, and Stomach cancer.  ROS:   Please see the history of present illness.    All other systems reviewed and are negative.  EKGs/Labs/Other Studies Reviewed:    The following studies were reviewed today: Study Highlights    The left ventricular ejection fraction is severely decreased (<30%).  Nuclear stress EF: 23%.  No T wave inversion was noted during stress.  There was no ST segment deviation noted during stress.  Defect 1: There is a medium defect of moderate severity present in the mid inferolateral and apical inferior location.  This is a high risk study.   No reversible ischemia. Medium size, moderate intensity fixed inferior attenuation artifact (worse at rest than stress). Moderately dilated LV with severe global hypokinesis - LVEF 23%. This is a high risk study. Echocardiographic and clinical correlation is recommended.    IMPRESSIONS    1. Left ventricular ejection fraction, by visual estimation, is 35 to  40%. The left ventricle has moderately decreased function with global  hypokinesis. The left ventricle cavity is mildy dilated. There is no left  ventricular  hypertrophy.  2. Abnormal left ventricular global longitudinal strain (-11.1 %).  3. Left ventricular diastolic parameters are consistent with Grade I  diastolic dysfunction (impaired relaxation).  4. Global right ventricle has normal systolic function.The right  ventricular size is normal. No increase in right ventricular wall  thickness.  5. Left atrial size was normal.  6. Right atrial size was normal.  7. The mitral valve is normal in structure. Mild mitral valve  regurgitation. No evidence of mitral stenosis.  8. The tricuspid valve is normal in structure. Tricuspid valve  regurgitation is mild.  9. The aortic valve is normal in structure. Aortic valve regurgitation is  not visualized. No evidence of aortic valve sclerosis or stenosis.  10. The pulmonic valve was not well visualized. Pulmonic valve  regurgitation is not visualized.  11. Normal pulmonary artery systolic pressure.  12. No prior study for comparison.    Recent Labs: 03/15/2019: ALT 40 05/10/2019: BUN 18; Creatinine, Ser 1.65; Hemoglobin 16.6; Platelets 206; Potassium 4.2; Sodium 137  Recent Lipid Panel    Component Value Date/Time   CHOL 100 03/08/2019 0727   TRIG 118.0 03/08/2019 0727   HDL 36.70 (L) 03/08/2019 0727   CHOLHDL 3 03/08/2019 0727   VLDL 23.6 03/08/2019 0727   LDLCALC 40 03/08/2019 0727   LDLDIRECT 97.0 10/18/2017 0941    Physical Exam:    VS:  BP (!) 162/98   Pulse 90   Ht 6\' 1"  (1.854 m)   Wt 226 lb (102.5 kg)   SpO2 97%   BMI 29.82 kg/m     Wt Readings from Last 3 Encounters:  05/23/19 226 lb (102.5 kg)  05/17/19 222 lb (100.7 kg)  05/12/19 222 lb (100.7 kg)     GEN: Patient is in no acute distress HEENT: Normal NECK: No JVD; No carotid bruits LYMPHATICS: No lymphadenopathy CARDIAC: Hear sounds regular, 2/6 systolic murmur at the apex. RESPIRATORY:  Clear to auscultation without rales, wheezing or rhonchi  ABDOMEN: Soft, non-tender, non-distended MUSCULOSKELETAL:   No edema; No deformity  SKIN: Warm and dry NEUROLOGIC:  Alert and oriented x 3 PSYCHIATRIC:  Normal affect   Signed, 05/14/19, MD  05/23/2019 4:05 PM    [facility]

## 2019-05-23 NOTE — Progress Notes (Signed)
  Echocardiogram 2D Echocardiogram has been performed.  Sinda Du 05/23/2019, 9:43 AM

## 2019-05-23 NOTE — Patient Instructions (Addendum)
Medication Instructions:  No medication changes. *If you need a refill on your cardiac medications before your next appointment, please call your pharmacy*  Lab Work: You had a BMET and CBC done today. If you have labs (blood work) drawn today and your tests are completely normal, you will receive your results only by: Marland Kitchen MyChart Message (if you have MyChart) OR . A paper copy in the mail If you have any lab test that is abnormal or we need to change your treatment, we will call you to review the results.  Testing/Procedures: Your physician has requested that you have a cardiac catheterization. Cardiac catheterization is used to diagnose and/or treat various heart conditions. Doctors may recommend this procedure for a number of different reasons. The most common reason is to evaluate chest pain. Chest pain can be a symptom of coronary artery disease (CAD), and cardiac catheterization can show whether plaque is narrowing or blocking your heart's arteries. This procedure is also used to evaluate the valves, as well as measure the blood flow and oxygen levels in different parts of your heart. For further information please visit [email or web address]. Please follow instruction sheet, as given.     [address] [place] [address] Dept: [phone] Loc: [phone]  Michael Guerra  05/23/2019  You are scheduled for a Cardiac Catheterization on Wednesday, February 10 with Dr. Lauree Chandler.  1. Please arrive at the [facility] (Main Entrance A) at [facility]: [address] [place], [place] [address] at 12:30 PM (This time is two hours before your procedure to ensure your preparation). Free valet parking service is available.   Special note: Every effort is made to have your procedure done on time. Please understand that emergencies sometimes delay scheduled procedures.  2. Diet: Do  not eat solid foods after midnight.  The patient may have clear liquids until 5am upon the day of the procedure.  3. Labs: You had labs done on 05/23/2019. . You do not need to be fasting.  4. Medication instructions in preparation for your procedure:   Contrast Allergy: No   Hold your Losartan and Metformin.   Do not take Diabetes Med Glucophage (Metformin) on the day of the procedure and HOLD 48 HOURS AFTER THE PROCEDURE.  On the morning of your procedure, take your Aspirin and any morning medicines NOT listed above.  You may use sips of water.  5. Plan for one night stay--bring personal belongings. 6. Bring a current list of your medications and current insurance cards. 7. You MUST have a responsible person to drive you home. 8. Someone MUST be with you the first 24 hours after you arrive home or your discharge will be delayed. 9. Please wear clothes that are easy to get on and off and wear slip-on shoes.  Thank you for allowing [place] to care for you!   -- [facility]  Follow-Up: At [facility], you and your health needs are our priority.  As part of our continuing mission to provide you with exceptional heart care, we have created designated Provider Care Teams.  These Care Teams include your primary Cardiologist (physician) and Advanced Practice Providers (APPs -  Physician Assistants and Nurse Practitioners) who all work together to provide you with the care you need, when you need it.  Your next appointment:   1 month(s)  The format for your next appointment:   In Person  Provider:   Belva Crome, MD  Other Instructions na

## 2019-05-24 LAB — BASIC METABOLIC PANEL
BUN/Creatinine Ratio: 9 — ABNORMAL LOW (ref 10–24)
BUN: 14 mg/dL (ref 8–27)
CO2: 25 mmol/L (ref 20–29)
Calcium: 10 mg/dL (ref 8.6–10.2)
Chloride: 100 mmol/L (ref 96–106)
Creatinine, Ser: 1.6 mg/dL — ABNORMAL HIGH (ref 0.76–1.27)
GFR calc Af Amer: 53 mL/min/{1.73_m2} — ABNORMAL LOW (ref >59)
GFR calc non Af Amer: 46 mL/min/{1.73_m2} — ABNORMAL LOW (ref >59)
Glucose: 229 mg/dL — ABNORMAL HIGH (ref 65–99)
Potassium: 4.6 mmol/L (ref 3.5–5.2)
Sodium: 141 mmol/L (ref 134–144)

## 2019-05-24 LAB — CBC WITH DIFFERENTIAL/PLATELET
Basophils Absolute: 0.1 10*3/uL (ref 0.0–0.2)
Basos: 1 %
EOS (ABSOLUTE): 0.2 10*3/uL (ref 0.0–0.4)
Eos: 4 %
Hematocrit: 50.8 % (ref 37.5–51.0)
Hemoglobin: 16.8 g/dL (ref 13.0–17.7)
Immature Grans (Abs): 0 10*3/uL (ref 0.0–0.1)
Immature Granulocytes: 0 %
Lymphocytes Absolute: 1.7 10*3/uL (ref 0.7–3.1)
Lymphs: 42 %
MCH: 28.7 pg (ref 26.6–33.0)
MCHC: 33.1 g/dL (ref 31.5–35.7)
MCV: 87 fL (ref 79–97)
Monocytes Absolute: 0.5 10*3/uL (ref 0.1–0.9)
Monocytes: 12 %
Neutrophils Absolute: 1.7 10*3/uL (ref 1.4–7.0)
Neutrophils: 41 %
Platelets: 217 10*3/uL (ref 150–450)
RBC: 5.86 x10E6/uL — ABNORMAL HIGH (ref 4.14–5.80)
RDW: 13.1 % (ref 11.6–15.4)
WBC: 4.1 10*3/uL (ref 3.4–10.8)

## 2019-05-25 ENCOUNTER — Telehealth: Payer: Self-pay

## 2019-05-25 ENCOUNTER — Other Ambulatory Visit: Payer: Self-pay

## 2019-05-25 DIAGNOSIS — IMO0002 Reserved for concepts with insufficient information to code with codable children: Secondary | ICD-10-CM

## 2019-05-25 DIAGNOSIS — Q600 Renal agenesis, unilateral: Secondary | ICD-10-CM

## 2019-05-25 DIAGNOSIS — N289 Disorder of kidney and ureter, unspecified: Secondary | ICD-10-CM

## 2019-05-25 NOTE — Telephone Encounter (Signed)
Called and spoke with the cath lab staff at [facility] and made them aware of the patients most recent BMET.Jeanene Erb and spoke with Mr. Droke and made him aware of the lab results as per Dr. Kem Parkinson result note and recommendations.  Pt aware to be at [facility] on 05/31/19 around 10:00 for repeat BMET as per time Cath lab suggested. Pt verbalized understanding and had no additional questions.

## 2019-05-27 ENCOUNTER — Other Ambulatory Visit (HOSPITAL_COMMUNITY)
Admission: RE | Admit: 2019-05-27 | Discharge: 2019-05-27 | Disposition: A | Discharge status: Home / Self Care | Payer: No Typology Code available for payment source | Source: Ambulatory Visit | Attending: Cardiovascular Disease | Admitting: Cardiovascular Disease

## 2019-05-27 DIAGNOSIS — Z20822 Contact with and (suspected) exposure to covid-19: Secondary | ICD-10-CM | POA: Insufficient documentation

## 2019-05-27 DIAGNOSIS — Z01812 Encounter for preprocedural laboratory examination: Secondary | ICD-10-CM | POA: Diagnosis not present

## 2019-05-27 LAB — SARS CORONAVIRUS 2 (TAT 6-24 HRS): SARS Coronavirus 2: NEGATIVE

## 2019-05-30 ENCOUNTER — Telehealth: Payer: Self-pay | Admitting: *Deleted

## 2019-05-30 NOTE — Telephone Encounter (Addendum)
Pt contacted pre-catheterization scheduled at [facility] for: Wednesday May 31, 2019 2:30 PM Verified arrival time and place: [facility] Main Entrance A [facility]) at: 12:30 PM   No solid food after midnight prior to cath, clear liquids until 5 AM day of procedure. Contrast allergy: no  Hold: Farxiga-AM of procedure Sildenafil-until post procedure.  Except hold medications AM meds can be  taken pre-cath with sip of water including: ASA 81 mg   Confirmed patient has responsible adult to drive home post procedure and observe 24 hours after arriving home:  yes  Currently, due to Covid-19 pandemic, only one person will be allowed with patient. Must be the same person for patient's entire stay and will be required to wear a mask. They will be asked to wait in the waiting room for the duration of the patient's stay.  Patients are required to wear a mask when they enter the hospital.      COVID-19 Pre-Screening Questions:  . In the past 7 to 10 days have you had a cough,  shortness of breath, headache, congestion, fever (100 or greater) body aches, chills, sore throat, or sudden loss of taste or sense of smell? no . Have you been around anyone with known Covid 19 in 7-10 day? no . Have you been around anyone who is awaiting Covid 19 test results in the past 7 to 10 days? no . Have you been around anyone who has been exposed to Covid 19, or has mentioned symptoms of Covid 19 within the past 7 to 10 days? no  I reviewed procedure/mask/visitor instructions, Covid-19 screening questions with patient, he verbalized understanding, thanked me for call.

## 2019-05-31 ENCOUNTER — Encounter (HOSPITAL_COMMUNITY)
Admission: RE | Disposition: A | Discharge status: Home / Self Care | Payer: Self-pay | Source: Home / Self Care | Attending: Cardiovascular Disease

## 2019-05-31 ENCOUNTER — Other Ambulatory Visit: Payer: Self-pay

## 2019-05-31 ENCOUNTER — Ambulatory Visit (HOSPITAL_COMMUNITY)
Admission: RE | Admit: 2019-05-31 | Discharge: 2019-05-31 | Disposition: A | Discharge status: Home / Self Care | Payer: No Typology Code available for payment source | Attending: Cardiovascular Disease | Admitting: Cardiovascular Disease

## 2019-05-31 DIAGNOSIS — R0789 Other chest pain: Secondary | ICD-10-CM | POA: Diagnosis not present

## 2019-05-31 DIAGNOSIS — N189 Chronic kidney disease, unspecified: Secondary | ICD-10-CM | POA: Insufficient documentation

## 2019-05-31 DIAGNOSIS — Z7982 Long term (current) use of aspirin: Secondary | ICD-10-CM | POA: Insufficient documentation

## 2019-05-31 DIAGNOSIS — Z888 Allergy status to other drugs, medicaments and biological substances status: Secondary | ICD-10-CM | POA: Diagnosis not present

## 2019-05-31 DIAGNOSIS — E088 Diabetes mellitus due to underlying condition with unspecified complications: Secondary | ICD-10-CM

## 2019-05-31 DIAGNOSIS — I129 Hypertensive chronic kidney disease with stage 1 through stage 4 chronic kidney disease, or unspecified chronic kidney disease: Secondary | ICD-10-CM | POA: Diagnosis not present

## 2019-05-31 DIAGNOSIS — I420 Dilated cardiomyopathy: Secondary | ICD-10-CM | POA: Insufficient documentation

## 2019-05-31 DIAGNOSIS — I2510 Atherosclerotic heart disease of native coronary artery without angina pectoris: Secondary | ICD-10-CM | POA: Insufficient documentation

## 2019-05-31 DIAGNOSIS — IMO0002 Reserved for concepts with insufficient information to code with codable children: Secondary | ICD-10-CM

## 2019-05-31 DIAGNOSIS — Z79899 Other long term (current) drug therapy: Secondary | ICD-10-CM | POA: Diagnosis not present

## 2019-05-31 DIAGNOSIS — E1122 Type 2 diabetes mellitus with diabetic chronic kidney disease: Secondary | ICD-10-CM | POA: Diagnosis not present

## 2019-05-31 DIAGNOSIS — E1136 Type 2 diabetes mellitus with diabetic cataract: Secondary | ICD-10-CM | POA: Insufficient documentation

## 2019-05-31 DIAGNOSIS — I429 Cardiomyopathy, unspecified: Secondary | ICD-10-CM | POA: Diagnosis not present

## 2019-05-31 DIAGNOSIS — N289 Disorder of kidney and ureter, unspecified: Secondary | ICD-10-CM

## 2019-05-31 DIAGNOSIS — E782 Mixed hyperlipidemia: Secondary | ICD-10-CM | POA: Diagnosis not present

## 2019-05-31 DIAGNOSIS — Z7984 Long term (current) use of oral hypoglycemic drugs: Secondary | ICD-10-CM | POA: Insufficient documentation

## 2019-05-31 DIAGNOSIS — I10 Essential (primary) hypertension: Secondary | ICD-10-CM

## 2019-05-31 DIAGNOSIS — Q600 Renal agenesis, unilateral: Secondary | ICD-10-CM

## 2019-05-31 HISTORY — PX: LEFT HEART CATH AND CORONARY ANGIOGRAPHY: CATH118249

## 2019-05-31 LAB — GLUCOSE, CAPILLARY
Glucose-Capillary: 102 mg/dL — ABNORMAL HIGH (ref 70–99)
Glucose-Capillary: 69 mg/dL — ABNORMAL LOW (ref 70–99)

## 2019-05-31 LAB — BASIC METABOLIC PANEL
Anion gap: 11 (ref 5–15)
BUN: 14 mg/dL (ref 8–23)
CO2: 23 mmol/L (ref 22–32)
Calcium: 9.1 mg/dL (ref 8.9–10.3)
Chloride: 102 mmol/L (ref 98–111)
Creatinine, Ser: 1.48 mg/dL — ABNORMAL HIGH (ref 0.61–1.24)
GFR calc Af Amer: 58 mL/min — ABNORMAL LOW (ref >60)
GFR calc non Af Amer: 50 mL/min — ABNORMAL LOW (ref >60)
Glucose, Bld: 114 mg/dL — ABNORMAL HIGH (ref 70–99)
Potassium: 4.2 mmol/L (ref 3.5–5.1)
Sodium: 136 mmol/L (ref 135–145)

## 2019-05-31 SURGERY — LEFT HEART CATH AND CORONARY ANGIOGRAPHY
Anesthesia: LOCAL

## 2019-05-31 MED ORDER — IOHEXOL 350 MG/ML SOLN
INTRAVENOUS | Status: DC | PRN
  Administered 2019-05-31: 16:00:00 30 mL via INTRACARDIAC

## 2019-05-31 MED ORDER — ONDANSETRON HCL 4 MG/2ML IJ SOLN: 4.0000 mg | Freq: Four times a day (QID) | INTRAMUSCULAR | Status: DC | PRN

## 2019-05-31 MED ORDER — SODIUM CHLORIDE 0.9% FLUSH: 3.0000 mL | Freq: Two times a day (BID) | INTRAVENOUS | Status: DC

## 2019-05-31 MED ORDER — LIDOCAINE HCL (PF) 1 % IJ SOLN
INTRAMUSCULAR | Status: AC
  Filled 2019-05-31: qty 30

## 2019-05-31 MED ORDER — FENTANYL CITRATE (PF) 100 MCG/2ML IJ SOLN
INTRAMUSCULAR | Status: DC | PRN
  Administered 2019-05-31: 50 ug via INTRAVENOUS

## 2019-05-31 MED ORDER — SODIUM CHLORIDE 0.9 % WEIGHT BASED INFUSION
1.0000 mL/kg/h | INTRAVENOUS | Status: DC
  Administered 2019-05-31: 14:00:00 1 mL/kg/h via INTRAVENOUS

## 2019-05-31 MED ORDER — SODIUM CHLORIDE 0.9 % IV SOLN: INTRAVENOUS | Status: DC

## 2019-05-31 MED ORDER — SODIUM CHLORIDE 0.9% FLUSH: 3.0000 mL | INTRAVENOUS | Status: DC | PRN

## 2019-05-31 MED ORDER — VERAPAMIL HCL 2.5 MG/ML IV SOLN
INTRAVENOUS | Status: DC | PRN
  Administered 2019-05-31: 15:00:00 10 mL via INTRA_ARTERIAL

## 2019-05-31 MED ORDER — FENTANYL CITRATE (PF) 100 MCG/2ML IJ SOLN
INTRAMUSCULAR | Status: AC
  Filled 2019-05-31: qty 2

## 2019-05-31 MED ORDER — SODIUM CHLORIDE 0.9 % IV SOLN: 250.0000 mL | INTRAVENOUS | Status: DC | PRN

## 2019-05-31 MED ORDER — MIDAZOLAM HCL 2 MG/2ML IJ SOLN
INTRAMUSCULAR | Status: AC
  Filled 2019-05-31: qty 2

## 2019-05-31 MED ORDER — LABETALOL HCL 5 MG/ML IV SOLN: 10.0000 mg | INTRAVENOUS | Status: DC | PRN

## 2019-05-31 MED ORDER — MIDAZOLAM HCL 2 MG/2ML IJ SOLN
INTRAMUSCULAR | Status: DC | PRN
  Administered 2019-05-31: 1 mg via INTRAVENOUS

## 2019-05-31 MED ORDER — ASPIRIN 81 MG PO CHEW: 81.0000 mg | CHEWABLE_TABLET | ORAL | Status: DC

## 2019-05-31 MED ORDER — SODIUM CHLORIDE 0.9 % WEIGHT BASED INFUSION
3.0000 mL/kg/h | INTRAVENOUS | Status: AC
  Administered 2019-05-31: 12:00:00 3 mL/kg/h via INTRAVENOUS

## 2019-05-31 MED ORDER — VERAPAMIL HCL 2.5 MG/ML IV SOLN
INTRAVENOUS | Status: AC
  Filled 2019-05-31: qty 2

## 2019-05-31 MED ORDER — HEPARIN (PORCINE) IN NACL 1000-0.9 UT/500ML-% IV SOLN
INTRAVENOUS | Status: AC
  Filled 2019-05-31: qty 1000

## 2019-05-31 MED ORDER — LIDOCAINE HCL (PF) 1 % IJ SOLN
INTRAMUSCULAR | Status: DC | PRN
  Administered 2019-05-31: 2 mL via INTRADERMAL

## 2019-05-31 MED ORDER — HEPARIN (PORCINE) IN NACL 1000-0.9 UT/500ML-% IV SOLN
INTRAVENOUS | Status: DC | PRN
  Administered 2019-05-31 (×2): 500 mL

## 2019-05-31 MED ORDER — HEPARIN SODIUM (PORCINE) 1000 UNIT/ML IJ SOLN
INTRAMUSCULAR | Status: AC
  Filled 2019-05-31: qty 1

## 2019-05-31 MED ORDER — ACETAMINOPHEN 325 MG PO TABS: 650.0000 mg | ORAL_TABLET | ORAL | Status: DC | PRN

## 2019-05-31 MED ORDER — HEPARIN SODIUM (PORCINE) 1000 UNIT/ML IJ SOLN
INTRAMUSCULAR | Status: DC | PRN
  Administered 2019-05-31: 5000 [IU] via INTRAVENOUS

## 2019-05-31 SURGICAL SUPPLY — 10 items
CATH INFINITI 5FR JK (CATHETERS) ×1 IMPLANT
CATH INFINITI JR4 5F (CATHETERS) ×1 IMPLANT
DEVICE RAD COMP TR BAND LRG (VASCULAR PRODUCTS) ×1 IMPLANT
GLIDESHEATH SLEND SS 6F .021 (SHEATH) ×1 IMPLANT
GUIDEWIRE INQWIRE 1.5J.035X260 (WIRE) IMPLANT
INQWIRE 1.5J .035X260CM (WIRE) ×2
KIT HEART LEFT (KITS) ×2 IMPLANT
PACK CARDIAC CATHETERIZATION (CUSTOM PROCEDURE TRAY) ×2 IMPLANT
TRANSDUCER W/STOPCOCK (MISCELLANEOUS) ×2 IMPLANT
TUBING CIL FLEX 10 FLL-RA (TUBING) ×2 IMPLANT

## 2019-05-31 NOTE — Discharge Instructions (Signed)
Radial Site Care  This sheet gives you information about how to care for yourself after your procedure. Your health care provider may also give you more specific instructions. If you have problems or questions, contact your health care provider. What can I expect after the procedure? After the procedure, it is common to have:  Bruising and tenderness at the catheter insertion area. Follow these instructions at home: Medicines  Take over-the-counter and prescription medicines only as told by your health care provider. Insertion site care  Follow instructions from your health care provider about how to take care of your insertion site. Make sure you: ? Wash your hands with soap and water before you change your bandage (dressing). If soap and water are not available, use hand sanitizer. ? Change your dressing as told by your health care provider. ? Leave stitches (sutures), skin glue, or adhesive strips in place. These skin closures may need to stay in place for 2 weeks or longer. If adhesive strip edges start to loosen and curl up, you may trim the loose edges. Do not remove adhesive strips completely unless your health care provider tells you to do that.  Check your insertion site every day for signs of infection. Check for: ? Redness, swelling, or pain. ? Fluid or blood. ? Pus or a bad smell. ? Warmth.  Do not take baths, swim, or use a hot tub until your health care provider approves.  You may shower 24-48 hours after the procedure, or as directed by your health care provider. ? Remove the dressing and gently wash the site with plain soap and water. ? Pat the area dry with a clean towel. ? Do not rub the site. That could cause bleeding.  Do not apply powder or lotion to the site. Activity   For 24 hours after the procedure, or as directed by your health care provider: ? Do not flex or bend the affected arm. ? Do not push or pull heavy objects with the affected arm. ? Do not  drive yourself home from the hospital or clinic. You may drive 24 hours after the procedure unless your health care provider tells you not to. ? Do not operate machinery or power tools.  Do not lift anything that is heavier than 10 lb (4.5 kg), or the limit that you are told, until your health care provider says that it is safe.  Ask your health care provider when it is okay to: ? Return to work or school. ? Resume usual physical activities or sports. ? Resume sexual activity. General instructions  If the catheter site starts to bleed, raise your arm and put firm pressure on the site. If the bleeding does not stop, get help right away. This is a medical emergency.  If you went home on the same day as your procedure, a responsible adult should be with you for the first 24 hours after you arrive home.  Keep all follow-up visits as told by your health care provider. This is important. Contact a health care provider if:  You have a fever.  You have redness, swelling, or yellow drainage around your insertion site. Get help right away if:  You have unusual pain at the radial site.  The catheter insertion area swells very fast.  The insertion area is bleeding, and the bleeding does not stop when you hold steady pressure on the area.  Your arm or hand becomes pale, cool, tingly, or numb. These symptoms may represent a serious problem   that is an emergency. Do not wait to see if the symptoms will go away. Get medical help right away. Call your local emergency services (911 in the [place] Do not drive yourself to the hospital. Summary  After the procedure, it is common to have bruising and tenderness at the site.  Follow instructions from your health care provider about how to take care of your radial site wound. Check the wound every day for signs of infection.  Do not lift anything that is heavier than 10 lb (4.5 kg), or the limit that you are told, until your health care provider says  that it is safe. This information is not intended to replace advice given to you by your health care provider. Make sure you discuss any questions you have with your health care provider. Document Revised: 05/12/2017 Document Reviewed: 05/12/2017 Elsevier Patient Education  2020 Elsevier Inc.  

## 2019-05-31 NOTE — Interval H&P Note (Signed)
Cath Lab Visit (complete for each Cath Lab visit)  Clinical Evaluation Leading to the Procedure:   ACS: No.  Non-ACS:    Anginal Classification: CCS II  Anti-ischemic medical therapy: No Therapy  Non-Invasive Test Results: High-risk stress test findings: cardiac mortality >3%/year  Prior CABG: No previous CABG      History and Physical Interval Note:  05/31/2019 3:18 PM  Si Gaul Pokorney  has presented today for surgery, with the diagnosis of cardiomyopathy.  The various methods of treatment have been discussed with the patient and family. After consideration of risks, benefits and other options for treatment, the patient has consented to  Procedure(s): LEFT HEART CATH AND CORONARY ANGIOGRAPHY (N/A) as a surgical intervention.  The patient's history has been reviewed, patient examined, no change in status, stable for surgery.  I have reviewed the patient's chart and labs.  Questions were answered to the patient's satisfaction.     Lorine Bears

## 2019-05-31 NOTE — Progress Notes (Addendum)
Discharge instructions reviewed with pt and his wife (via telephone) both voice understanding.  

## 2019-06-01 ENCOUNTER — Telehealth: Payer: Self-pay

## 2019-06-01 NOTE — Telephone Encounter (Signed)
-----   Message from Garwin Brothers, MD sent at 05/31/2019  4:14 PM EST ----- Please schedule an appointment to see me in the next 8 to 10 days or so ----- Message ----- From: Iran Ouch, MD Sent: 05/31/2019   4:04 PM EST To: Sharlene Dory, DO, #

## 2019-06-08 ENCOUNTER — Encounter: Payer: Self-pay | Admitting: Cardiology

## 2019-06-08 ENCOUNTER — Telehealth: Payer: Self-pay

## 2019-06-08 ENCOUNTER — Other Ambulatory Visit: Payer: Self-pay

## 2019-06-08 ENCOUNTER — Telehealth (INDEPENDENT_AMBULATORY_CARE_PROVIDER_SITE_OTHER): Payer: No Typology Code available for payment source | Admitting: Cardiology

## 2019-06-08 VITALS — BP 160/96 | HR 88 | Ht 72.0 in | Wt 221.0 lb

## 2019-06-08 DIAGNOSIS — IMO0002 Reserved for concepts with insufficient information to code with codable children: Secondary | ICD-10-CM

## 2019-06-08 DIAGNOSIS — I10 Essential (primary) hypertension: Secondary | ICD-10-CM

## 2019-06-08 DIAGNOSIS — E119 Type 2 diabetes mellitus without complications: Secondary | ICD-10-CM

## 2019-06-08 DIAGNOSIS — N289 Disorder of kidney and ureter, unspecified: Secondary | ICD-10-CM

## 2019-06-08 DIAGNOSIS — I2510 Atherosclerotic heart disease of native coronary artery without angina pectoris: Secondary | ICD-10-CM

## 2019-06-08 DIAGNOSIS — E782 Mixed hyperlipidemia: Secondary | ICD-10-CM

## 2019-06-08 DIAGNOSIS — I429 Cardiomyopathy, unspecified: Secondary | ICD-10-CM

## 2019-06-08 DIAGNOSIS — E088 Diabetes mellitus due to underlying condition with unspecified complications: Secondary | ICD-10-CM

## 2019-06-08 DIAGNOSIS — Q600 Renal agenesis, unilateral: Secondary | ICD-10-CM

## 2019-06-08 HISTORY — DX: Atherosclerotic heart disease of native coronary artery without angina pectoris: I25.10

## 2019-06-08 MED ORDER — CARVEDILOL 3.125 MG PO TABS: 3.1250 mg | ORAL_TABLET | Freq: Two times a day (BID) | ORAL | 12 refills | Status: DC

## 2019-06-08 MED FILL — CARVEDILOL 3.125 MG TABLET: 3.125 | 30 days supply | Qty: 60 | Fill #0

## 2019-06-08 NOTE — Patient Instructions (Addendum)
Medication Instructions:  Your physician has recommended you make the following change in your medication:   Start Coreg 3.125 mg twice daily. *If you need a refill on your cardiac medications before your next appointment, please call your pharmacy*  Lab Work: You will have labs done at your next appointment and you need to be fasting. If you have labs (blood work) drawn today and your tests are completely normal, you will receive your results only by: Marland Kitchen MyChart Message (if you have MyChart) OR . A paper copy in the mail If you have any lab test that is abnormal or we need to change your treatment, we will call you to review the results.  Testing/Procedures: None ordered  Follow-Up: At [facility], you and your health needs are our priority.  As part of our continuing mission to provide you with exceptional heart care, we have created designated Provider Care Teams.  These Care Teams include your primary Cardiologist (physician) and Advanced Practice Providers (APPs -  Physician Assistants and Nurse Practitioners) who all work together to provide you with the care you need, when you need it.  Your next appointment:   1 month(s)  The format for your next appointment:   In Person  Provider:   Belva Crome, MD  Other Instructions  Dr. Tomie China wil be in [facility] 06/29/19 and 3/15-3/19, 2021.  Remember to make an early morning appointment after you have been fasting for labs.

## 2019-06-08 NOTE — Progress Notes (Signed)
Virtual Visit via Video Note   This visit type was conducted due to national recommendations for restrictions regarding the COVID-19 Pandemic (e.g. social distancing) in an effort to limit this patient's exposure and mitigate transmission in our community.  Due to his co-morbid illnesses, this patient is at least at moderate risk for complications without adequate follow up.  This format is felt to be most appropriate for this patient at this time.  All issues noted in this document were discussed and addressed.  A limited physical exam was performed with this format.  Please refer to the patient's chart for his consent to telehealth for [facility].   Date:  06/08/2019   ID:  Michael Guerra, DOB March 18, 1961, MRN [id number]  Patient Location: Home Provider Location: Home  PCP:  Sharlene Dory, DO  Cardiologist:  No primary care provider on file.  Electrophysiologist:  None   Evaluation Performed:  Follow-Up Visit  Chief Complaint: Follow-up after coronary angiography  History of Present Illness:    Michael Guerra is a 59 y.o. male with past medical history of essential hypertension dyslipidemia diabetes mellitus.  Patient was evaluated and for cardiomyopathy sent for coronary angiography.  This is revealed nonobstructive disease.  He has significant coronary artery disease but nonobstructive.  He is here for follow-up today.  He denies any chest pain orthopnea or PND.  At the time of my evaluation, the patient is alert awake oriented and in no distress.  The patient does not have symptoms concerning for COVID-19 infection (fever, chills, cough, or new shortness of breath).    Past Medical History:  Diagnosis Date  . Blood transfusion without reported diagnosis   . Cataract   . Chronic kidney disease    one kidney that is healthy  . Diabetes mellitus   . Hyperlipidemia   . Hypertension   . Renal insufficiency 05/23/2019  . Solitary kidney    Past Surgical History:    Procedure Laterality Date  . ABDOMINAL SURGERY    . KIDNEY SURGERY     bowel obstruction  x 3  . LEFT HEART CATH AND CORONARY ANGIOGRAPHY N/A 05/31/2019   Procedure: LEFT HEART CATH AND CORONARY ANGIOGRAPHY;  Surgeon: Iran Ouch, MD;  Location: [facility] INVASIVE CV LAB;  Service: Cardiovascular;  Laterality: N/A;     Current Meds  Medication Sig  . ACCU-CHEK FASTCLIX LANCETS MISC Use as directed twice daily to check blood sugar.  Marland Kitchen ACCU-CHEK GUIDE test strip USE TO CHECK BLOOD SUGAR 2 TIMES DAILY  . acetaminophen (TYLENOL) 500 MG tablet Take 1,000 mg by mouth every 6 (six) hours as needed for moderate pain or headache.  Marland Kitchen aspirin 81 MG tablet Take 81 mg by mouth daily.    Marland Kitchen atorvastatin (LIPITOR) 40 MG tablet TAKE 1 TABLET (40 MG TOTAL) BY MOUTH DAILY.  . COLLAGEN PO Take 1 Scoop by mouth daily. Collagen powder  . Cyanocobalamin (B-12 PO) Take 1 tablet by mouth daily with lunch.  . dapagliflozin propanediol (FARXIGA) 10 MG TABS tablet Take 10 mg by mouth daily.  Marland Kitchen MAGNESIUM PO Take 1 tablet by mouth daily with lunch.  . Melatonin 10 MG CAPS Take 30 mg by mouth at bedtime as needed (sleep).  . Multiple Vitamins-Minerals (MULTIVITAMIN ADULT) TABS Take 1 tablet by mouth daily with lunch.   . Multiple Vitamins-Minerals (ZINC PO) Take 1 capsule by mouth daily with lunch.  . Omega-3 Fatty Acids (FISH OIL) 1000 MG CAPS Take 1,000 mg by mouth  daily with lunch.   Marland Kitchen POTASSIUM PO Take 1 tablet by mouth daily with lunch.  . Probiotic CAPS Take 2 capsules by mouth daily.  . sildenafil (REVATIO) 20 MG tablet Take 20 mg by mouth daily as needed (ED).   Marland Kitchen testosterone cypionate (DEPOTESTOTERONE CYPIONATE) 100 MG/ML injection Inject 100 mg into the muscle every Wednesday.      Allergies:   Lisinopril   Social History   Tobacco Use  . Smoking status: Never Smoker  . Smokeless tobacco: Never Used  Substance Use Topics  . Alcohol use: No  . Drug use: No     Family Hx: The patient's family  history is negative for Cancer, Colon cancer, Esophageal cancer, Rectal cancer, and Stomach cancer.  ROS:   Please see the history of present illness.    As mentioned above All other systems reviewed and are negative.   Prior CV studies:   The following studies were reviewed today:  LEFT HEART CATH AND CORONARY ANGIOGRAPHY  Conclusion    Mid Cx lesion is 30% stenosed.  1st Mrg lesion is 65% stenosed.  Dist LAD lesion is 70% stenosed.  Mid LAD lesion is 20% stenosed.  Ost LAD to Prox LAD lesion is 30% stenosed.   1.  Borderline significant two-vessel coronary artery disease including OM1 and distal/apical LAD. 2.  Left ventricular angiography was not performed due to chronic kidney disease.  EF was moderately reduced by echo. 3.  Mildly elevated left ventricular end-diastolic pressure at 19 mmHg.  Recommendations: The patient's cardiomyopathy is out of proportion to his coronary artery disease.  Coronary revascularization is not indicated at this point.  Recommend aggressive treatment of risk factors. I suspect his cardiomyopathy is likely due to hypertensive heart disease.  Consider treatment with a beta-blocker and an ARB if renal function allows. Only 30 mL of contrast was used. The patient can resume work on Monday.     Labs/Other Tests and Data Reviewed:    EKG:  EKG from previous visit was reviewed and discussed with patient  Recent Labs: 03/15/2019: ALT 40 05/23/2019: Hemoglobin 16.8; Platelets 217 05/31/2019: BUN 14; Creatinine, Ser 1.48; Potassium 4.2; Sodium 136   Recent Lipid Panel Lab Results  Component Value Date/Time   CHOL 100 03/08/2019 07:27 AM   TRIG 118.0 03/08/2019 07:27 AM   HDL 36.70 (L) 03/08/2019 07:27 AM   CHOLHDL 3 03/08/2019 07:27 AM   LDLCALC 40 03/08/2019 07:27 AM   LDLDIRECT 97.0 10/18/2017 09:41 AM    Wt Readings from Last 3 Encounters:  06/08/19 221 lb (100.2 kg)  05/31/19 221 lb (100.2 kg)  05/23/19 226 lb (102.5 kg)      Objective:    Vital Signs:  BP (!) 160/96   Pulse 88   Ht 6' (1.829 m)   Wt 221 lb (100.2 kg)   BMI 29.97 kg/m    VITAL SIGNS:  reviewed  ASSESSMENT & PLAN:    1. Coronary artery disease: Secondary prevention stressed with the patient.  Importance of compliance with diet and medication stressed and he vocalized understanding.  Importance of regular exercise stressed walking half an hour a day at least 5 days a week 2. Cardiomyopathy: We will monitor this closely.  He may be a candidate for ACE inhibitor.  I will decide this when I see him in appointment next.  He has renal insufficiency his family is concerned that we will have to monitor his electrolytes very closely and I would like to have this discussion  when I see him and a face-to-face appointment in the next few days.  Today's appointment was made televisit because of inclement weather.  I told the patient to take food low in salt and to avoid canned foods and such which are rich in salt. 3. Essential hypertension: Blood pressure is elevated.  He told me that he was running around searching for his blood pressure machine and got him a little excited.  Will initiate carvedilol 3.125 mg twice daily.  Salt intake issues were heavily emphasized. 4. Mixed dyslipidemia and diabetes mellitus: Diet was discussed.  He was told to come for his next appointment fasting so we will get all blood work including fasting lipids patient had multiple questions which were answered to satisfaction.  COVID-19 Education: The signs and symptoms of COVID-19 were discussed with the patient and how to seek care for testing (follow up with PCP or arrange E-visit).  The importance of social distancing was discussed today.  Time:   Today, I have spent 25 minutes with the patient with telehealth technology discussing the above problems.     Medication Adjustments/Labs and Tests Ordered: Current medicines are reviewed at length with the patient today.   Concerns regarding medicines are outlined above.   Tests Ordered: No orders of the defined types were placed in this encounter.   Medication Changes: No orders of the defined types were placed in this encounter.   Follow Up:  In Person Next few days  Signed, Garwin Brothers, MD  06/08/2019 9:10 AM    [facility]

## 2019-06-08 NOTE — Telephone Encounter (Signed)
Spoke with pt for after visit summary.  Medication was sent to [facility]. Pt requested to make 1 month follow up until he checks his schedule. Pt was placed on a recall.

## 2019-06-16 ENCOUNTER — Other Ambulatory Visit (INDEPENDENT_AMBULATORY_CARE_PROVIDER_SITE_OTHER): Payer: No Typology Code available for payment source

## 2019-06-16 ENCOUNTER — Other Ambulatory Visit: Payer: Self-pay | Admitting: Family Medicine

## 2019-06-16 ENCOUNTER — Other Ambulatory Visit: Payer: Self-pay

## 2019-06-16 DIAGNOSIS — R7401 Elevation of levels of liver transaminase levels: Secondary | ICD-10-CM | POA: Diagnosis not present

## 2019-06-16 LAB — HEPATIC FUNCTION PANEL
ALT: 40 U/L (ref 0–53)
AST: 57 U/L — ABNORMAL HIGH (ref 0–37)
Albumin: 4.2 g/dL (ref 3.5–5.2)
Alkaline Phosphatase: 75 U/L (ref 39–117)
Bilirubin, Direct: 0.4 mg/dL — ABNORMAL HIGH (ref 0.0–0.3)
Total Bilirubin: 2.2 mg/dL — ABNORMAL HIGH (ref 0.2–1.2)
Total Protein: 7.1 g/dL (ref 6.0–8.3)

## 2019-06-18 ENCOUNTER — Emergency Department (HOSPITAL_BASED_OUTPATIENT_CLINIC_OR_DEPARTMENT_OTHER): Payer: No Typology Code available for payment source

## 2019-06-18 ENCOUNTER — Other Ambulatory Visit: Payer: Self-pay

## 2019-06-18 ENCOUNTER — Encounter (HOSPITAL_BASED_OUTPATIENT_CLINIC_OR_DEPARTMENT_OTHER): Payer: Self-pay

## 2019-06-18 ENCOUNTER — Emergency Department (HOSPITAL_BASED_OUTPATIENT_CLINIC_OR_DEPARTMENT_OTHER)
Admission: EM | Admit: 2019-06-18 | Discharge: 2019-06-18 | Disposition: A | Discharge status: Home / Self Care | Payer: No Typology Code available for payment source | Attending: Emergency Medicine | Admitting: Emergency Medicine

## 2019-06-18 DIAGNOSIS — E119 Type 2 diabetes mellitus without complications: Secondary | ICD-10-CM | POA: Insufficient documentation

## 2019-06-18 DIAGNOSIS — Z79899 Other long term (current) drug therapy: Secondary | ICD-10-CM | POA: Diagnosis not present

## 2019-06-18 DIAGNOSIS — I10 Essential (primary) hypertension: Secondary | ICD-10-CM | POA: Insufficient documentation

## 2019-06-18 DIAGNOSIS — M722 Plantar fascial fibromatosis: Secondary | ICD-10-CM | POA: Insufficient documentation

## 2019-06-18 DIAGNOSIS — Z7982 Long term (current) use of aspirin: Secondary | ICD-10-CM | POA: Insufficient documentation

## 2019-06-18 DIAGNOSIS — I2510 Atherosclerotic heart disease of native coronary artery without angina pectoris: Secondary | ICD-10-CM | POA: Insufficient documentation

## 2019-06-18 DIAGNOSIS — M79672 Pain in left foot: Secondary | ICD-10-CM | POA: Diagnosis present

## 2019-06-18 NOTE — ED Provider Notes (Signed)
[facility] EMERGENCY DEPARTMENT Provider Note   CSN: [id number] Arrival date & time: 06/18/19  1028     History Chief Complaint  Patient presents with  . Foot Pain    Michael Guerra is a 59 y.o. male with PMHx HTN, HLD, CKD who presents to the ED today with complaint of gradual onset, constant, achy, L foot pain x 2 days.  Denies any known injury.  He does recall that he stumped one of his middle toes on the left side on a chair 2 days before the pain started however reports it was not a very hard hit and no breaks in the skin.  His pain is exacerbated with bearing weight on his foot.  He states the pain is more in the middle of his foot on the plantar aspect.  He states he has been taking Tylenol with mild relief however states that even with Tylenol if he puts pressure on his foot it hurts.  Denies pain to the ankle, fevers, chills, weakness, numbness, any other associated symptoms.  The history is provided by the patient.       Past Medical History:  Diagnosis Date  . Blood transfusion without reported diagnosis   . Cataract   . Chronic kidney disease    one kidney that is healthy  . Diabetes mellitus   . Hyperlipidemia   . Hypertension   . Renal insufficiency 05/23/2019  . Solitary kidney     Patient Active Problem List   Diagnosis Date Noted  . CAD (coronary artery disease) 06/08/2019  . Cardiomyopathy (HCC) 05/23/2019  . Solitary kidney 05/23/2019  . Renal insufficiency 05/23/2019  . Chest discomfort 05/12/2019  . Essential hypertension 05/12/2019  . Mixed dyslipidemia 05/12/2019  . Diabetes mellitus due to underlying condition with unspecified complications (HCC) 05/12/2019  . LIVER FUNCTION TESTS, ABNORMAL 05/21/2006    Past Surgical History:  Procedure Laterality Date  . ABDOMINAL SURGERY    . KIDNEY SURGERY     bowel obstruction  x 3  . LEFT HEART CATH AND CORONARY ANGIOGRAPHY N/A 05/31/2019   Procedure: LEFT HEART CATH AND CORONARY ANGIOGRAPHY;   Surgeon: Iran Ouch, MD;  Location: [facility] INVASIVE CV LAB;  Service: Cardiovascular;  Laterality: N/A;       Family History  Problem Relation Age of Onset  . Cancer Neg Hx   . Colon cancer Neg Hx   . Esophageal cancer Neg Hx   . Rectal cancer Neg Hx   . Stomach cancer Neg Hx     Social History   Tobacco Use  . Smoking status: Never Smoker  . Smokeless tobacco: Never Used  Substance Use Topics  . Alcohol use: No  . Drug use: No    Home Medications Prior to Admission medications   Medication Sig Start Date End Date Taking? Authorizing Provider  ACCU-CHEK FASTCLIX LANCETS MISC Use as directed twice daily to check blood sugar. 02/16/18   Sharlene Dory, DO  ACCU-CHEK GUIDE test strip USE TO CHECK BLOOD SUGAR 2 TIMES DAILY 04/03/19   Carmelia Roller, [name], DO  acetaminophen (TYLENOL) 500 MG tablet Take 1,000 mg by mouth every 6 (six) hours as needed for moderate pain or headache.    [provider]  aspirin 81 MG tablet Take 81 mg by mouth daily.      [provider]  atorvastatin (LIPITOR) 40 MG tablet TAKE 1 TABLET (40 MG TOTAL) BY MOUTH DAILY. 04/03/19   Sharlene Dory, DO  carvedilol (  COREG) 3.125 MG tablet Take 1 tablet (3.125 mg total) by mouth 2 (two) times daily with a meal. 06/08/19   Revankar, Aundra Dubin, MD  COLLAGEN PO Take 1 Scoop by mouth daily. Collagen powder    [provider]  Cyanocobalamin (B-12 PO) Take 1 tablet by mouth daily with lunch.    [provider]  dapagliflozin propanediol (FARXIGA) 10 MG TABS tablet Take 10 mg by mouth daily. 11/24/18   Sharlene Dory, DO  MAGNESIUM PO Take 1 tablet by mouth daily with lunch.    [provider]  Melatonin 10 MG CAPS Take 30 mg by mouth at bedtime as needed (sleep).    [provider]  Multiple Vitamins-Minerals (MULTIVITAMIN ADULT) TABS Take 1 tablet by mouth daily with lunch.     [provider]  Multiple Vitamins-Minerals  (ZINC PO) Take 1 capsule by mouth daily with lunch.    [provider]  Omega-3 Fatty Acids (FISH OIL) 1000 MG CAPS Take 1,000 mg by mouth daily with lunch.     [provider]  POTASSIUM PO Take 1 tablet by mouth daily with lunch.    [provider]  Probiotic CAPS Take 2 capsules by mouth daily.    [provider]  sildenafil (REVATIO) 20 MG tablet Take 20 mg by mouth daily as needed (ED).  05/22/19   [provider]  testosterone cypionate (DEPOTESTOTERONE CYPIONATE) 100 MG/ML injection Inject 100 mg into the muscle every Wednesday.  05/01/19   [provider]    Allergies    Lisinopril  Review of Systems   Review of Systems  Constitutional: Negative for chills and fever.  Musculoskeletal: Positive for arthralgias.  Skin: Negative for wound.  Neurological: Negative for weakness and numbness.    Physical Exam Updated Vital Signs BP (!) 147/101 (BP Location: Right Arm)   Pulse 84   Temp 98.4 F (36.9 C) (Oral)   Resp 18   Ht 6\' 1"  (1.854 m)   Wt 99.8 kg   SpO2 99%   BMI 29.03 kg/m   Physical Exam Vitals and nursing note reviewed.  Constitutional:      Appearance: He is not ill-appearing or diaphoretic.  HENT:     Head: Normocephalic and atraumatic.  Eyes:     Conjunctiva/sclera: Conjunctivae normal.  Cardiovascular:     Rate and Rhythm: Normal rate and regular rhythm.     Pulses: Normal pulses.  Pulmonary:     Effort: Pulmonary effort is normal.     Breath sounds: Normal breath sounds. No wheezing, rhonchi or rales.  Musculoskeletal:     Comments: No obvious swelling or ecchymosis noted to L foot. + tenderness to mid foot on plantar aspect worsening with dorsiflexion and relieved with plantarflexion. No tenderness to toes. Cap refill < 2 seconds. No tenderness to ankle joint. ROM intact. Strength and sensation intact throughout. 2+ DP and PT pulse.   Skin:    General: Skin is warm and dry.     Coloration: Skin is  not jaundiced.  Neurological:     Mental Status: He is alert.     ED Results / Procedures / Treatments   Labs (all labs ordered are listed, but only abnormal results are displayed) Labs Reviewed - No data to display  EKG None  Radiology DG Foot Complete Left  Result Date: 06/18/2019 CLINICAL DATA:  Mid foot pain after injury.  Kicked chair with foot. EXAM: LEFT FOOT - COMPLETE 3+ VIEW COMPARISON:  None.  FINDINGS: There is no evidence of fracture or dislocation. There is no evidence of arthropathy or other focal bone abnormality. Soft tissues are unremarkable. IMPRESSION: Negative. Electronically Signed   By: Misty Stanley M.D.   On: 06/18/2019 11:17    Procedures Procedures (including critical care time)  Medications Ordered in ED Medications - No data to display  ED Course  I have reviewed the triage vital signs and the nursing notes.  Pertinent labs & imaging results that were available during my care of the patient were reviewed by me and considered in my medical decision making (see chart for details).  59 year old male who presents to the ED today complaining of left foot pain, no known trauma however does report he stubbed his toe approximately 2 days before the pain started however pain is more on the plantar aspect.  On arrival to the ED patient is afebrile, nontachycardic and nontachypneic.  He appears to be in no acute distress.  Does not have any tenderness to his toes to suggest injury from stubbing his toe prior.  Patient's tenderness is more on the plantar aspect and worsened with dorsiflexion.  There is suspicion for plantar fasciitis at this time however patient denies any previous history with this.  Will obtain x-ray at this time to ensure there is no fractures.  If no acute findings patient instructed on buying brace over-the-counter to wear at nighttime as well as rolling his foot on frozen water bottles and taking ibuprofen and Tylenol as needed for pain.  Will  give patient information for sports medicine physician for further evaluation.   Xray negative. Pt discharged home at this time with above instructions. Strict return precautions discussed. Pt is in agreement with plan and stable for discharge home.   This note was prepared using Dragon voice recognition software and may include unintentional dictation errors due to the inherent limitations of voice recognition software.     MDM Rules/Calculators/A&P                       Final Clinical Impression(s) / ED Diagnoses Final diagnoses:  Foot pain, left  Plantar fasciitis    Rx / DC Orders ED Discharge Orders    None       Discharge Instructions     Your xray did not show any breaks today. Your symptoms may  be consistent with plantar fasciitis. Attached is information on this.   Please buy foot brace OTC to wear at nighttime to help keep your foot stretched. You can roll your foot on a frozen water bottle to help stretch out the fascia as well. Information on rehab attached.   Please follow up with Dr. Raeford Razor with sports medicine for further eval  You can take Ibuprofen and Tylenol as needed for pain  Return to the ED for any worsening symptoms        Eustaquio Maize, PA-C 06/18/19 1138    Charlesetta Shanks, MD 06/20/19 [phone]

## 2019-06-18 NOTE — Discharge Instructions (Signed)
Your xray did not show any breaks today. Your symptoms may  be consistent with plantar fasciitis. Attached is information on this.   Please buy foot brace OTC to wear at nighttime to help keep your foot stretched. You can roll your foot on a frozen water bottle to help stretch out the fascia as well. Information on rehab attached.   Please follow up with Dr. Jordan Likes with sports medicine for further eval  You can take Ibuprofen and Tylenol as needed for pain  Return to the ED for any worsening symptoms

## 2019-06-18 NOTE — ED Triage Notes (Signed)
Pt reports left foot pain beginning Saturday morning, took tylenol yesterday with minimal relief.  Pt recalls hitting foot against a chair Thursday, but not painful at that time.  Pt states unable to bear weight except on the heel.

## 2019-06-20 ENCOUNTER — Other Ambulatory Visit: Payer: Self-pay

## 2019-06-20 ENCOUNTER — Ambulatory Visit (HOSPITAL_BASED_OUTPATIENT_CLINIC_OR_DEPARTMENT_OTHER)
Admission: RE | Admit: 2019-06-20 | Discharge: 2019-06-20 | Disposition: A | Discharge status: Home / Self Care | Payer: No Typology Code available for payment source | Source: Ambulatory Visit | Attending: Family Medicine | Admitting: Family Medicine

## 2019-06-20 DIAGNOSIS — R7401 Elevation of levels of liver transaminase levels: Secondary | ICD-10-CM | POA: Insufficient documentation

## 2019-06-22 ENCOUNTER — Encounter: Payer: Self-pay | Admitting: Cardiology

## 2019-06-22 ENCOUNTER — Ambulatory Visit: Payer: No Typology Code available for payment source | Admitting: Family Medicine

## 2019-06-22 ENCOUNTER — Other Ambulatory Visit: Payer: Self-pay

## 2019-06-22 ENCOUNTER — Encounter: Payer: Self-pay | Admitting: Family Medicine

## 2019-06-22 ENCOUNTER — Ambulatory Visit (INDEPENDENT_AMBULATORY_CARE_PROVIDER_SITE_OTHER): Payer: No Typology Code available for payment source | Admitting: Cardiology

## 2019-06-22 ENCOUNTER — Ambulatory Visit: Payer: Self-pay

## 2019-06-22 VITALS — BP 151/91 | HR 81 | Ht 73.0 in | Wt 220.0 lb

## 2019-06-22 VITALS — BP 146/86 | HR 90 | Ht 73.0 in | Wt 227.0 lb

## 2019-06-22 DIAGNOSIS — E782 Mixed hyperlipidemia: Secondary | ICD-10-CM | POA: Diagnosis not present

## 2019-06-22 DIAGNOSIS — M79672 Pain in left foot: Secondary | ICD-10-CM

## 2019-06-22 DIAGNOSIS — S93602A Unspecified sprain of left foot, initial encounter: Secondary | ICD-10-CM

## 2019-06-22 DIAGNOSIS — Q600 Renal agenesis, unilateral: Secondary | ICD-10-CM

## 2019-06-22 DIAGNOSIS — I420 Dilated cardiomyopathy: Secondary | ICD-10-CM

## 2019-06-22 DIAGNOSIS — I10 Essential (primary) hypertension: Secondary | ICD-10-CM

## 2019-06-22 DIAGNOSIS — IMO0002 Reserved for concepts with insufficient information to code with codable children: Secondary | ICD-10-CM

## 2019-06-22 DIAGNOSIS — N289 Disorder of kidney and ureter, unspecified: Secondary | ICD-10-CM

## 2019-06-22 HISTORY — DX: Unspecified sprain of left foot, initial encounter: S93.602A

## 2019-06-22 NOTE — Progress Notes (Signed)
Cardiology Office Note:    Date:  06/22/2019   ID:  [name], DOB February 01, 1961, MRN [id number]  PCP:  Sharlene Dory, DO  Cardiologist:  Garwin Brothers, MD   Referring MD: Sharlene Dory*    ASSESSMENT:    1. Dilated cardiomyopathy (HCC)   2. Essential hypertension   3. Mixed dyslipidemia   4. Solitary kidney   5. Renal insufficiency    PLAN:    In order of problems listed above:  1. Coronary artery disease: Secondary prevention stressed with the patient.  Importance of compliance with diet and medication stressed and he vocalized understanding.  Importance of regular exercise stressed and he was emphasized to walk at least 30 minutes a day 5 days a week and he promises to do so. 2. Cardiomyopathy: His blood pressure is elevated and I will double his beta-blocker dose and is agreeable to it. 3. Essential hypertension: Stable.  As mentioned above.  Diet was emphasized he checks his weight on a regular basis and salt issues were discussed 4. Renal insufficiency: Stable will be monitored closely by his primary care physician. 5. Patient will be seen in follow-up appointment in 3 months or earlier if the patient has any concerns    Medication Adjustments/Labs and Tests Ordered: Current medicines are reviewed at length with the patient today.  Concerns regarding medicines are outlined above.  No orders of the defined types were placed in this encounter.  No orders of the defined types were placed in this encounter.    Chief Complaint  Patient presents with  . Follow-up     History of Present Illness:    Michael Guerra is a 59 y.o. male.  Patient has history of cardiomyopathy.  He has renal insufficiency and diabetes mellitus and dyslipidemia.  He denies any problems at this time and takes care of activities of daily living.  No chest pain orthopnea or PND.  At the time of my evaluation, the patient is alert awake oriented and in no distress.  He has  begun walking on a regular basis.  Past Medical History:  Diagnosis Date  . Blood transfusion without reported diagnosis   . CAD (coronary artery disease) 06/08/2019  . Cardiomyopathy (HCC) 05/23/2019  . Cataract   . Chest discomfort 05/12/2019  . Chronic kidney disease    one kidney that is healthy  . Diabetes mellitus   . Diabetes mellitus due to underlying condition with unspecified complications (HCC) 05/12/2019  . Essential hypertension 05/12/2019  . Hyperlipidemia   . Hypertension   . LIVER FUNCTION TESTS, ABNORMAL 05/21/2006   Qualifier: Diagnosis of  By: Maple Hudson MD, Riki Rusk    . Mixed dyslipidemia 05/12/2019  . Renal insufficiency 05/23/2019  . Solitary kidney     Past Surgical History:  Procedure Laterality Date  . ABDOMINAL SURGERY    . KIDNEY SURGERY     bowel obstruction  x 3  . LEFT HEART CATH AND CORONARY ANGIOGRAPHY N/A 05/31/2019   Procedure: LEFT HEART CATH AND CORONARY ANGIOGRAPHY;  Surgeon: Iran Ouch, MD;  Location: [facility];  Service: Cardiovascular;  Laterality: N/A;    Current Medications: Current Meds  Medication Sig  . ACCU-CHEK FASTCLIX LANCETS MISC Use as directed twice daily to check blood sugar.  Marland Kitchen ACCU-CHEK GUIDE test strip USE TO CHECK BLOOD SUGAR 2 TIMES DAILY  . acetaminophen (TYLENOL) 500 MG tablet Take 1,000 mg by mouth every 6 (six) hours as needed for moderate pain or  headache.  Marland Kitchen aspirin 81 MG tablet Take 81 mg by mouth daily.    Marland Kitchen atorvastatin (LIPITOR) 40 MG tablet TAKE 1 TABLET (40 MG TOTAL) BY MOUTH DAILY.  . carvedilol (COREG) 3.125 MG tablet Take 1 tablet (3.125 mg total) by mouth 2 (two) times daily with a meal.  . COLLAGEN PO Take 1 Scoop by mouth daily. Collagen powder  . Cyanocobalamin (B-12 PO) Take 1 tablet by mouth daily with lunch.  . dapagliflozin propanediol (FARXIGA) 10 MG TABS tablet Take 10 mg by mouth daily.  Marland Kitchen MAGNESIUM PO Take 1 tablet by mouth daily with lunch.  . Melatonin 10 MG CAPS Take 30 mg by mouth at  bedtime as needed (sleep).  . Multiple Vitamins-Minerals (MULTIVITAMIN ADULT) TABS Take 1 tablet by mouth daily with lunch.   . Multiple Vitamins-Minerals (ZINC PO) Take 1 capsule by mouth daily with lunch.  . Omega-3 Fatty Acids (FISH OIL) 1000 MG CAPS Take 1,000 mg by mouth daily with lunch.   Marland Kitchen POTASSIUM PO Take 1 tablet by mouth daily with lunch.  . Probiotic CAPS Take 2 capsules by mouth daily.  . sildenafil (REVATIO) 20 MG tablet Take 20 mg by mouth daily as needed (ED).   Marland Kitchen testosterone cypionate (DEPOTESTOTERONE CYPIONATE) 100 MG/ML injection Inject 100 mg into the muscle every Wednesday.      Allergies:   Lisinopril   Social History   Socioeconomic History  . Marital status: Married    Spouse name: Not on file  . Number of children: Not on file  . Years of education: Not on file  . Highest education level: Not on file  Occupational History  . Not on file  Tobacco Use  . Smoking status: Never Smoker  . Smokeless tobacco: Never Used  Substance and Sexual Activity  . Alcohol use: No  . Drug use: No  . Sexual activity: Not on file  Other Topics Concern  . Not on file  Social History Narrative  . Not on file   Social Determinants of Health   Financial Resource Strain:   . Difficulty of Paying Living Expenses: Not on file  Food Insecurity:   . Worried About Programme researcher, broadcasting/film/video in the Last Year: Not on file  . Ran Out of Food in the Last Year: Not on file  Transportation Needs:   . Lack of Transportation (Medical): Not on file  . Lack of Transportation (Non-Medical): Not on file  Physical Activity:   . Days of Exercise per Week: Not on file  . Minutes of Exercise per Session: Not on file  Stress:   . Feeling of Stress : Not on file  Social Connections:   . Frequency of Communication with Friends and Family: Not on file  . Frequency of Social Gatherings with Friends and Family: Not on file  . Attends Religious Services: Not on file  . Active Member of Clubs or  Organizations: Not on file  . Attends Banker Meetings: Not on file  . Marital Status: Not on file     Family History: The patient's family history is negative for Cancer, Colon cancer, Esophageal cancer, Rectal cancer, and Stomach cancer.  ROS:   Please see the history of present illness.    All other systems reviewed and are negative.  EKGs/Labs/Other Studies Reviewed:    The following studies were reviewed today: I discussed my findings with the patient at length   Recent Labs: 05/23/2019: Hemoglobin 16.8; Platelets 217 05/31/2019: BUN 14;  Creatinine, Ser 1.48; Potassium 4.2; Sodium 136 06/16/2019: ALT 40  Recent Lipid Panel    Component Value Date/Time   CHOL 100 03/08/2019 0727   TRIG 118.0 03/08/2019 0727   HDL 36.70 (L) 03/08/2019 0727   CHOLHDL 3 03/08/2019 0727   VLDL 23.6 03/08/2019 0727   LDLCALC 40 03/08/2019 0727   LDLDIRECT 97.0 10/18/2017 0941    Physical Exam:    VS:  BP (!) 146/86   Pulse 90   Ht 6\' 1"  (1.854 m)   Wt 227 lb (103 kg)   SpO2 93%   BMI 29.95 kg/m     Wt Readings from Last 3 Encounters:  06/22/19 227 lb (103 kg)  06/22/19 220 lb (99.8 kg)  06/18/19 220 lb (99.8 kg)     GEN: Patient is in no acute distress HEENT: Normal NECK: No JVD; No carotid bruits LYMPHATICS: No lymphadenopathy CARDIAC: Hear sounds regular, 2/6 systolic murmur at the apex. RESPIRATORY:  Clear to auscultation without rales, wheezing or rhonchi  ABDOMEN: Soft, non-tender, non-distended MUSCULOSKELETAL:  No edema; No deformity  SKIN: Warm and dry NEUROLOGIC:  Alert and oriented x 3 PSYCHIATRIC:  Normal affect   Signed, Jenean Lindau, MD  06/22/2019 4:36 PM    [facility]

## 2019-06-22 NOTE — Patient Instructions (Signed)
Nice to meet you Please try the post op shoe. Try this for 1-2 weeks and then use as needed  Please try ice  Please try the pennsaid   Please send me a message in MyChart with any questions or updates.  Please see me back in 4 weeks.   --Dr. Jordan Likes

## 2019-06-22 NOTE — Progress Notes (Signed)
Michael Guerra - 59 y.o. male MRN [id number]  Date of birth: [birth year]  SUBJECTIVE:  Including CC & ROS.  Chief Complaint  Patient presents with  . Foot Pain    left foot x 06/17/2019    Michael Guerra is a 59 y.o. male that is presenting with left foot pain.  His pain is occurring over the dorsal aspect of the fourth and fifth metatarsal.  This occurred after he hit his foot on a chair.  He is having pain with ambulation.  Having some swelling in this area.  He takes Tylenol which helps some.  No history of similar pain.  No history of surgery..  Independent review of the left foot x-ray from 2/28 shows no acute change.   Review of Systems See HPI   HISTORY: Past Medical, Surgical, Social, and Family History Reviewed & Updated per EMR.   Pertinent Historical Findings include:  Past Medical History:  Diagnosis Date  . Blood transfusion without reported diagnosis   . CAD (coronary artery disease) 06/08/2019  . Cardiomyopathy (HCC) 05/23/2019  . Cataract   . Chest discomfort 05/12/2019  . Chronic kidney disease    one kidney that is healthy  . Diabetes mellitus   . Diabetes mellitus due to underlying condition with unspecified complications (HCC) 05/12/2019  . Essential hypertension 05/12/2019  . Hyperlipidemia   . Hypertension   . LIVER FUNCTION TESTS, ABNORMAL 05/21/2006   Qualifier: Diagnosis of  By: Maple Hudson MD, Riki Rusk    . Mixed dyslipidemia 05/12/2019  . Renal insufficiency 05/23/2019  . Solitary kidney     Past Surgical History:  Procedure Laterality Date  . ABDOMINAL SURGERY    . KIDNEY SURGERY     bowel obstruction  x 3  . LEFT HEART CATH AND CORONARY ANGIOGRAPHY N/A 05/31/2019   Procedure: LEFT HEART CATH AND CORONARY ANGIOGRAPHY;  Surgeon: Iran Ouch, MD;  Location: [facility];  Service: Cardiovascular;  Laterality: N/A;    Family History  Problem Relation Age of Onset  . Cancer Neg Hx   . Colon cancer Neg Hx   . Esophageal cancer Neg Hx   . Rectal  cancer Neg Hx   . Stomach cancer Neg Hx     Social History   Socioeconomic History  . Marital status: Married    Spouse name: Not on file  . Number of children: Not on file  . Years of education: Not on file  . Highest education level: Not on file  Occupational History  . Not on file  Tobacco Use  . Smoking status: Never Smoker  . Smokeless tobacco: Never Used  Substance and Sexual Activity  . Alcohol use: No  . Drug use: No  . Sexual activity: Not on file  Other Topics Concern  . Not on file  Social History Narrative  . Not on file   Social Determinants of Health   Financial Resource Strain:   . Difficulty of Paying Living Expenses: Not on file  Food Insecurity:   . Worried About Programme researcher, broadcasting/film/video in the Last Year: Not on file  . Ran Out of Food in the Last Year: Not on file  Transportation Needs:   . Lack of Transportation (Medical): Not on file  . Lack of Transportation (Non-Medical): Not on file  Physical Activity:   . Days of Exercise per Week: Not on file  . Minutes of Exercise per Session: Not on file  Stress:   . Feeling of Stress :  Not on file  Social Connections:   . Frequency of Communication with Friends and Family: Not on file  . Frequency of Social Gatherings with Friends and Family: Not on file  . Attends Religious Services: Not on file  . Active Member of Clubs or Organizations: Not on file  . Attends Archivist Meetings: Not on file  . Marital Status: Not on file  Intimate Partner Violence:   . Fear of Current or Ex-Partner: Not on file  . Emotionally Abused: Not on file  . Physically Abused: Not on file  . Sexually Abused: Not on file     PHYSICAL EXAM:  VS: BP (!) 151/91   Pulse 81   Ht 6\' 1"  (1.854 m)   Wt 220 lb (99.8 kg)   BMI 29.03 kg/m  Physical Exam Gen: NAD, alert, cooperative with exam, well-appearing MSK:  Left foot: No ecchymosis. Normal ankle range of motion and strength resistance. Tenderness to palpation  between the fourth and fifth metatarsal. No tenderness palpation at the base of the fifth. No tenderness over the lateral malleolus or cuboid. Neurovascularly intact  Limited ultrasound: Left foot:  Normal-appearing peroneal brevis into the base of the fifth metatarsal. No cortical irregularity of the fourth or fifth metatarsal. Soft tissue swelling apparent overlying the fifth metatarsal. There appears to be hyperemia that is increased between the fourth and fifth metatarsal space.  Summary: Findings suggest possible bone bruise or foot sprain  Ultrasound and interpretation by Clearance Coots, MD    ASSESSMENT & PLAN:   Foot sprain, left, initial encounter Either a sprain versus a bone bruise in the area from trauma. -Postop shoe.  Counseled on use -Counseled on home x-rays therapy and supportive care. -Provided samples of Pennsaid. -Could consider custom orthotics.

## 2019-06-22 NOTE — Progress Notes (Signed)
Medication Samples have been provided to the patient.  Drug name: Pennsaid      Strength: 2%        Qty: 2 Boxes  LOT: [id number]  Exp.Date: 02/2020  Dosing instructions: Use a pea size amount and rub gently.  The patient has been instructed regarding the correct time, dose, and frequency of taking this medication, including desired effects and most common side effects.   Kathi Simpers, MA 10:30 AM 06/22/2019

## 2019-06-22 NOTE — Assessment & Plan Note (Signed)
Either a sprain versus a bone bruise in the area from trauma. -Postop shoe.  Counseled on use -Counseled on home x-rays therapy and supportive care. -Provided samples of Pennsaid. -Could consider custom orthotics.

## 2019-06-22 NOTE — Patient Instructions (Addendum)
Medication Instructions:  No medication changes *If you need a refill on your cardiac medications before your next appointment, please call your pharmacy*   Lab Work: You had a BMET today If you have labs (blood work) drawn today and your tests are completely normal, you will receive your results only by: Marland Kitchen MyChart Message (if you have MyChart) OR . A paper copy in the mail If you have any lab test that is abnormal or we need to change your treatment, we will call you to review the results.   Testing/Procedures: None ordered   Follow-Up: At [facility], you and your health needs are our priority.  As part of our continuing mission to provide you with exceptional heart care, we have created designated Provider Care Teams.  These Care Teams include your primary Cardiologist (physician) and Advanced Practice Providers (APPs -  Physician Assistants and Nurse Practitioners) who all work together to provide you with the care you need, when you need it.  We recommend signing up for the patient portal called "MyChart".  Sign up information is provided on this After Visit Summary.  MyChart is used to connect with patients for Virtual Visits (Telemedicine).  Patients are able to view lab/test results, encounter notes, upcoming appointments, etc.  Non-urgent messages can be sent to your provider as well.   To learn more about what you can do with MyChart, go to [email or web address].    Your next appointment:   2 months  The format for your next appointment:   In Person  Provider:   Belva Crome, MD   Other Instructions NA

## 2019-06-23 LAB — BASIC METABOLIC PANEL
BUN/Creatinine Ratio: 13 (ref 10–24)
BUN: 18 mg/dL (ref 8–27)
CO2: 21 mmol/L (ref 20–29)
Calcium: 9.5 mg/dL (ref 8.6–10.2)
Chloride: 103 mmol/L (ref 96–106)
Creatinine, Ser: 1.43 mg/dL — ABNORMAL HIGH (ref 0.76–1.27)
GFR calc Af Amer: 61 mL/min/{1.73_m2} (ref >59)
GFR calc non Af Amer: 52 mL/min/{1.73_m2} — ABNORMAL LOW (ref >59)
Glucose: 230 mg/dL — ABNORMAL HIGH (ref 65–99)
Potassium: 4.2 mmol/L (ref 3.5–5.2)
Sodium: 139 mmol/L (ref 134–144)

## 2019-07-03 MED FILL — CARVEDILOL 3.125 MG TABLET: 3.125 | 30 days supply | Qty: 60 | Fill #1

## 2019-07-04 MED FILL — BD NEEDLES 22GX1.5": 22G X 1-1/2 | 28 days supply | Qty: 4 | Fill #1

## 2019-07-04 MED FILL — BD 3 ML SYRINGE 18GX1-1/2: 18G X 1-1/2 | 28 days supply | Qty: 4 | Fill #1

## 2019-07-04 MED FILL — BD 3 ML SYRINGE 18GX1-1/2": 18G X 1-1/2 | 28 days supply | Qty: 4 | Fill #1

## 2019-07-04 MED FILL — BD NEEDLES 22GX1.5: 22G X 1-1/2 | 28 days supply | Qty: 4 | Fill #1

## 2019-07-07 ENCOUNTER — Other Ambulatory Visit: Payer: Self-pay | Admitting: Family Medicine

## 2019-07-07 MED FILL — FARXIGA 10 MG TABLET: 10 | 90 days supply | Qty: 90 | Fill #0

## 2019-07-07 MED FILL — ATORVASTATIN 40 MG TABLET: 40 | 90 days supply | Qty: 90 | Fill #1

## 2019-07-20 ENCOUNTER — Ambulatory Visit: Payer: No Typology Code available for payment source | Admitting: Family Medicine

## 2019-07-20 MED FILL — TESTOSTERON CYP 1,000 MG/10: 100 | 80 days supply | Qty: 10 | Fill #0

## 2019-08-03 ENCOUNTER — Ambulatory Visit: Payer: No Typology Code available for payment source | Admitting: Cardiology

## 2019-08-03 MED FILL — CARVEDILOL 3.125 MG TABLET: 3.125 | 30 days supply | Qty: 60 | Fill #2

## 2019-08-10 MED FILL — BD NEEDLES 22GX1.5: 22G X 1-1/2 | 28 days supply | Qty: 4 | Fill #2

## 2019-08-10 MED FILL — BD 3 ML SYRINGE WITH NEEDLE: 21G X 1-1/2 | 28 days supply | Qty: 4 | Fill #0

## 2019-08-30 ENCOUNTER — Other Ambulatory Visit: Payer: Self-pay

## 2019-08-31 ENCOUNTER — Ambulatory Visit (INDEPENDENT_AMBULATORY_CARE_PROVIDER_SITE_OTHER): Payer: No Typology Code available for payment source | Admitting: Cardiology

## 2019-08-31 ENCOUNTER — Encounter: Payer: Self-pay | Admitting: Cardiology

## 2019-08-31 ENCOUNTER — Other Ambulatory Visit: Payer: Self-pay

## 2019-08-31 VITALS — BP 138/94 | HR 86 | Ht 73.0 in | Wt 219.0 lb

## 2019-08-31 DIAGNOSIS — I429 Cardiomyopathy, unspecified: Secondary | ICD-10-CM | POA: Diagnosis not present

## 2019-08-31 DIAGNOSIS — E782 Mixed hyperlipidemia: Secondary | ICD-10-CM

## 2019-08-31 DIAGNOSIS — E088 Diabetes mellitus due to underlying condition with unspecified complications: Secondary | ICD-10-CM | POA: Diagnosis not present

## 2019-08-31 DIAGNOSIS — N289 Disorder of kidney and ureter, unspecified: Secondary | ICD-10-CM

## 2019-08-31 DIAGNOSIS — IMO0002 Reserved for concepts with insufficient information to code with codable children: Secondary | ICD-10-CM

## 2019-08-31 DIAGNOSIS — I2510 Atherosclerotic heart disease of native coronary artery without angina pectoris: Secondary | ICD-10-CM

## 2019-08-31 DIAGNOSIS — Q600 Renal agenesis, unilateral: Secondary | ICD-10-CM

## 2019-08-31 DIAGNOSIS — I10 Essential (primary) hypertension: Secondary | ICD-10-CM | POA: Diagnosis not present

## 2019-08-31 MED ORDER — AMLODIPINE BESYLATE 5 MG PO TABS: 5.0000 mg | ORAL_TABLET | Freq: Every day | ORAL | 3 refills | Status: DC

## 2019-08-31 NOTE — Addendum Note (Signed)
Addended by: Eleonore Chiquito on: 08/31/2019 04:57 PM   Modules accepted: Orders

## 2019-08-31 NOTE — Progress Notes (Signed)
Cardiology Office Note:    Date:  08/31/2019   ID:  Michael Guerra, DOB [birth year], MRN [id number]  PCP:  Shelda Pal, DO  Cardiologist:  Jenean Lindau, MD   Referring MD: Shelda Pal*    ASSESSMENT:    No diagnosis found. PLAN:    In order of problems listed above:  1. Cardiomyopathy: I discussed my findings with the patient at extensive length and answered his questions.  Echocardiogram revealed ejection fraction of 35 to 40%.  I wanted to initiate Delene Loll however he has only 1 kidney with impaired renal function so I will refer him to our nephrologist to check if it is okay to initiate Entresto with serial monitoring of his renal function.  I am a little concerned that his renal function will deteriorate because of this medication.  I discussed this with him and is agreeable. 2. Coronary artery disease: Secondary prevention stressed.  Stable from the standpoint 3. Essential hypertension: Blood pressure is elevated and I will initiate him on Norvasc 5 mg daily he is going to keep a track of his blood pressures and see me in follow-up appointment in a month. 4. Renal insufficiency: As mentioned above patient will see nephrology.    Medication Adjustments/Labs and Tests Ordered: Current medicines are reviewed at length with the patient today.  Concerns regarding medicines are outlined above.  No orders of the defined types were placed in this encounter.  No orders of the defined types were placed in this encounter.    Chief Complaint  Patient presents with  . Follow-up     History of Present Illness:    Michael Guerra is a 59 y.o. male.  Patient has past medical history of nonobstructive coronary artery disease, advanced cardiomyopathy and renal insufficiency.  Patient has essential hypertension.  He denies any problems at this time and takes care of activities of daily living.  No chest pain orthopnea or PND.  He also is a diabetic.  At the time of  my evaluation, the patient is alert awake oriented and in no distress.  His ejection fraction was 35 to 40% on recent assessment.  Past Medical History:  Diagnosis Date  . Blood transfusion without reported diagnosis   . CAD (coronary artery disease) 06/08/2019  . Cardiomyopathy ([place]) 05/23/2019  . Cataract   . Chest discomfort 05/12/2019  . Chronic kidney disease    one kidney that is healthy  . Diabetes mellitus   . Diabetes mellitus due to underlying condition with unspecified complications ([place]) 3/97/6734  . Essential hypertension 05/12/2019  . Hyperlipidemia   . Hypertension   . LIVER FUNCTION TESTS, ABNORMAL 05/21/2006   Qualifier: Diagnosis of  By: Annamaria Boots MD, Ysidro Evert    . Mixed dyslipidemia 05/12/2019  . Renal insufficiency 05/23/2019  . Solitary kidney     Past Surgical History:  Procedure Laterality Date  . ABDOMINAL SURGERY    . KIDNEY SURGERY     bowel obstruction  x 3  . LEFT HEART CATH AND CORONARY ANGIOGRAPHY N/A 05/31/2019   Procedure: LEFT HEART CATH AND CORONARY ANGIOGRAPHY;  Surgeon: Wellington Hampshire, MD;  Location: [place] [facility];  Service: Cardiovascular;  Laterality: N/A;    Current Medications: Current Meds  Medication Sig  . ACCU-CHEK FASTCLIX LANCETS MISC Use as directed twice daily to check blood sugar.  Marland Kitchen ACCU-CHEK GUIDE test strip USE TO CHECK BLOOD SUGAR 2 TIMES DAILY  . acetaminophen (TYLENOL) 500 MG tablet Take 1,000 mg by  mouth every 6 (six) hours as needed for moderate pain or headache.  Marland Kitchen aspirin 81 MG tablet Take 81 mg by mouth daily.    Marland Kitchen atorvastatin (LIPITOR) 40 MG tablet TAKE 1 TABLET (40 MG TOTAL) BY MOUTH DAILY.  Marland Kitchen B-D 3CC LUER-LOK SYR 18GX1-1/2 18G X 1-1/2" 3 ML MISC   . B-D 3CC LUER-LOK SYR 21GX1-1/2 21G X 1-1/2" 3 ML MISC USE WEEKLY FOR DRAWING  . BD DISP NEEDLES 22G X 1-1/2" MISC   . carvedilol (COREG) 3.125 MG tablet Take 1 tablet (3.125 mg total) by mouth 2 (two) times daily with a meal.  . COLLAGEN PO Take 1 Scoop by mouth daily.  Collagen powder  . Cyanocobalamin (B-12 PO) Take 1 tablet by mouth daily with lunch.  Marland Kitchen FARXIGA 10 MG TABS tablet TAKE 1 TABLET BY MOUTH ONCE DAILY  . MAGNESIUM PO Take 1 tablet by mouth daily with lunch.  . Melatonin 10 MG CAPS Take 30 mg by mouth at bedtime as needed (sleep).  . Multiple Vitamins-Minerals (MULTIVITAMIN ADULT) TABS Take 1 tablet by mouth daily with lunch.   . Multiple Vitamins-Minerals (ZINC PO) Take 1 capsule by mouth daily with lunch.  . Omega-3 Fatty Acids (FISH OIL) 1000 MG CAPS Take 1,000 mg by mouth daily with lunch.   Marland Kitchen POTASSIUM PO Take 1 tablet by mouth daily with lunch.  . Probiotic CAPS Take 2 capsules by mouth daily.  . sildenafil (REVATIO) 20 MG tablet Take 20 mg by mouth daily as needed (ED).   Marland Kitchen testosterone cypionate (DEPOTESTOTERONE CYPIONATE) 100 MG/ML injection Inject 100 mg into the muscle every Wednesday.      Allergies:   Lisinopril   Social History   Socioeconomic History  . Marital status: Married    Spouse name: Not on file  . Number of children: Not on file  . Years of education: Not on file  . Highest education level: Not on file  Occupational History  . Not on file  Tobacco Use  . Smoking status: Never Smoker  . Smokeless tobacco: Never Used  Substance and Sexual Activity  . Alcohol use: No  . Drug use: No  . Sexual activity: Not on file  Other Topics Concern  . Not on file  Social History Narrative  . Not on file   Social Determinants of Health   Financial Resource Strain:   . Difficulty of Paying Living Expenses:   Food Insecurity:   . Worried About Programme researcher, broadcasting/film/video in the Last Year:   . Barista in the Last Year:   Transportation Needs:   . Freight forwarder (Medical):   Marland Kitchen Lack of Transportation (Non-Medical):   Physical Activity:   . Days of Exercise per Week:   . Minutes of Exercise per Session:   Stress:   . Feeling of Stress :   Social Connections:   . Frequency of Communication with Friends and  Family:   . Frequency of Social Gatherings with Friends and Family:   . Attends Religious Services:   . Active Member of Clubs or Organizations:   . Attends Banker Meetings:   Marland Kitchen Marital Status:      Family History: The patient's family history is negative for Cancer, Colon cancer, Esophageal cancer, Rectal cancer, and Stomach cancer.  ROS:   Please see the history of present illness.    All other systems reviewed and are negative.  EKGs/Labs/Other Studies Reviewed:    The following studies were  reviewed today: LEFT HEART CATH AND CORONARY ANGIOGRAPHY  Conclusion    Mid Cx lesion is 30% stenosed.  1st Mrg lesion is 65% stenosed.  Dist LAD lesion is 70% stenosed.  Mid LAD lesion is 20% stenosed.  Ost LAD to Prox LAD lesion is 30% stenosed.   1.  Borderline significant two-vessel coronary artery disease including OM1 and distal/apical LAD. 2.  Left ventricular angiography was not performed due to chronic kidney disease.  EF was moderately reduced by echo. 3.  Mildly elevated left ventricular end-diastolic pressure at 19 mmHg.  Recommendations: The patient's cardiomyopathy is out of proportion to his coronary artery disease.  Coronary revascularization is not indicated at this point.  Recommend aggressive treatment of risk factors. I suspect his cardiomyopathy is likely due to hypertensive heart disease.  Consider treatment with a beta-blocker and an ARB if renal function allows. Only 30 mL of contrast was used. The patient can resume work on Monday.      Recent Labs: 05/23/2019: Hemoglobin 16.8; Platelets 217 06/16/2019: ALT 40 06/22/2019: BUN 18; Creatinine, Ser 1.43; Potassium 4.2; Sodium 139  Recent Lipid Panel    Component Value Date/Time   CHOL 100 03/08/2019 0727   TRIG 118.0 03/08/2019 0727   HDL 36.70 (L) 03/08/2019 0727   CHOLHDL 3 03/08/2019 0727   VLDL 23.6 03/08/2019 0727   LDLCALC 40 03/08/2019 0727   LDLDIRECT 97.0 10/18/2017 0941     Physical Exam:    VS:  BP (!) 138/94   Pulse 86   Ht 6\' 1"  (1.854 m)   Wt 219 lb (99.3 kg)   SpO2 96%   BMI 28.89 kg/m     Wt Readings from Last 3 Encounters:  08/31/19 219 lb (99.3 kg)  06/22/19 227 lb (103 kg)  06/22/19 220 lb (99.8 kg)     GEN: Patient is in no acute distress HEENT: Normal NECK: No JVD; No carotid bruits LYMPHATICS: No lymphadenopathy CARDIAC: Hear sounds regular, 2/6 systolic murmur at the apex. RESPIRATORY:  Clear to auscultation without rales, wheezing or rhonchi  ABDOMEN: Soft, non-tender, non-distended MUSCULOSKELETAL:  No edema; No deformity  SKIN: Warm and dry NEUROLOGIC:  Alert and oriented x 3 PSYCHIATRIC:  Normal affect   Signed, 08/22/19, MD  08/31/2019 11:29 AM    [facility]

## 2019-08-31 NOTE — Patient Instructions (Signed)
Medication Instructions:  Your physician has recommended you make the following change in your medication: Start Norvasc 5 mg daily.  *If you need a refill on your cardiac medications before your next appointment, please call your pharmacy*   Lab Work: None ordered If you have labs (blood work) drawn today and your tests are completely normal, you will receive your results only by: Marland Kitchen MyChart Message (if you have MyChart) OR . A paper copy in the mail If you have any lab test that is abnormal or we need to change your treatment, we will call you to review the results.   Testing/Procedures: None ordered   Follow-Up: At [facility], you and your health needs are our priority.  As part of our continuing mission to provide you with exceptional heart care, we have created designated Provider Care Teams.  These Care Teams include your primary Cardiologist (physician) and Advanced Practice Providers (APPs -  Physician Assistants and Nurse Practitioners) who all work together to provide you with the care you need, when you need it.  We recommend signing up for the patient portal called "MyChart".  Sign up information is provided on this After Visit Summary.  MyChart is used to connect with patients for Virtual Visits (Telemedicine).  Patients are able to view lab/test results, encounter notes, upcoming appointments, etc.  Non-urgent messages can be sent to your provider as well.   To learn more about what you can do with MyChart, go to [email or web address].    Your next appointment:   1 month(s)  The format for your next appointment:   In Person  Provider:   Belva Crome, MD   Other Instructions NA

## 2019-09-06 ENCOUNTER — Encounter: Payer: No Typology Code available for payment source | Admitting: Family Medicine

## 2019-09-08 MED FILL — CARVEDILOL 3.125 MG TABLET: 3.125 | 30 days supply | Qty: 60 | Fill #3

## 2019-09-19 ENCOUNTER — Encounter: Payer: No Typology Code available for payment source | Admitting: Family Medicine

## 2019-09-21 MED FILL — BD 3 ML SYRINGE WITH NEEDLE: 21G X 1-1/2 | 21 days supply | Qty: 3 | Fill #1

## 2019-10-02 MED FILL — BD 3 ML SYRINGE 18GX1-1/2: 18G X 1-1/2 | 28 days supply | Qty: 4 | Fill #2

## 2019-10-02 MED FILL — ATORVASTATIN CALCIUM 40 MG: 40 | 90 days supply | Qty: 90 | Fill #2

## 2019-10-02 MED FILL — FARXIGA 10 MG TABLET: 10 | 90 days supply | Qty: 90 | Fill #1

## 2019-10-03 MED FILL — CARVEDILOL 3.125 MG TABLET: 3.125 | 30 days supply | Qty: 60 | Fill #4

## 2019-10-16 ENCOUNTER — Other Ambulatory Visit: Payer: Self-pay

## 2019-10-16 ENCOUNTER — Encounter: Payer: Self-pay | Admitting: Family Medicine

## 2019-10-16 ENCOUNTER — Ambulatory Visit (INDEPENDENT_AMBULATORY_CARE_PROVIDER_SITE_OTHER): Payer: No Typology Code available for payment source | Admitting: Family Medicine

## 2019-10-16 VITALS — BP 100/82 | HR 97 | Temp 95.9°F | Ht 73.0 in | Wt 216.5 lb

## 2019-10-16 DIAGNOSIS — E1165 Type 2 diabetes mellitus with hyperglycemia: Secondary | ICD-10-CM

## 2019-10-16 DIAGNOSIS — Z0000 Encounter for general adult medical examination without abnormal findings: Secondary | ICD-10-CM

## 2019-10-16 DIAGNOSIS — Z125 Encounter for screening for malignant neoplasm of prostate: Secondary | ICD-10-CM

## 2019-10-16 LAB — MICROALBUMIN / CREATININE URINE RATIO
Creatinine,U: 36.6 mg/dL
Microalb Creat Ratio: 1.9 mg/g (ref 0.0–30.0)
Microalb, Ur: <0.7 mg/dL (ref 0.0–1.9)

## 2019-10-16 NOTE — Patient Instructions (Addendum)
Give [place] 2-3 business days to get the results of your labs back.   Keep the diet clean and stay active.  Schedule your eye exam.   The new Shingrix vaccine (for shingles) is a 2 shot series. It can make people feel low energy, achy and almost like they have the flu for 48 hours after injection. Please plan accordingly when deciding on when to get this shot. Call our office for a nurse visit appointment to get this. The second shot of the series is less severe regarding the side effects, but it still lasts 48 hours.   OK to use Debrox (peroxide) in the ear to loosen up wax. Also recommend using a bulb syringe (for removing boogers from baby's noses) to flush through warm water. Do not use Q-tips as this can impact wax further.  Let us know if you need anything.

## 2019-10-16 NOTE — Progress Notes (Signed)
Chief Complaint  Patient presents with  . Annual Exam    Well Male Michael Guerra is here for a complete physical.   His last physical was >1 year ago.  Current diet: in general, a "healthy" diet.  Current exercise: Active at work, wt resistance, cardio Weight trend: lost a few lbs Fatigue? No. Seat belt? Yes.    Health maintenance Shingrix- No Colonoscopy- Yes Tetanus- Yes HIV- Yes Hep C- Yes   Past Medical History:  Diagnosis Date  . Blood transfusion without reported diagnosis   . CAD (coronary artery disease) 06/08/2019  . Cardiomyopathy ([place]) 05/23/2019  . Cataract   . Chest discomfort 05/12/2019  . Chronic kidney disease    one kidney that is healthy  . Diabetes mellitus   . Diabetes mellitus due to underlying condition with unspecified complications ([place]) 09/11/8183  . Essential hypertension 05/12/2019  . Hyperlipidemia   . Hypertension   . LIVER FUNCTION TESTS, ABNORMAL 05/21/2006   Qualifier: Diagnosis of  By: Annamaria Boots MD, Ysidro Evert    . Mixed dyslipidemia 05/12/2019  . Renal insufficiency 05/23/2019  . Solitary kidney       Past Surgical History:  Procedure Laterality Date  . ABDOMINAL SURGERY    . KIDNEY SURGERY     bowel obstruction  x 3  . LEFT HEART CATH AND CORONARY ANGIOGRAPHY N/A 05/31/2019   Procedure: LEFT HEART CATH AND CORONARY ANGIOGRAPHY;  Surgeon: Wellington Hampshire, MD;  Location: [facility];  Service: Cardiovascular;  Laterality: N/A;    Medications  Current Outpatient Medications on File Prior to Visit  Medication Sig Dispense Refill  . ACCU-CHEK FASTCLIX LANCETS MISC Use as directed twice daily to check blood sugar. 100 each 3  . ACCU-CHEK GUIDE test strip USE TO CHECK BLOOD SUGAR 2 TIMES DAILY 200 strip 1  . acetaminophen (TYLENOL) 500 MG tablet Take 1,000 mg by mouth every 6 (six) hours as needed for moderate pain or headache.    Marland Kitchen amLODipine (NORVASC) 5 MG tablet Take 1 tablet (5 mg total) by mouth daily. 180 tablet 3  . aspirin 81 MG  tablet Take 81 mg by mouth daily.      Marland Kitchen atorvastatin (LIPITOR) 40 MG tablet TAKE 1 TABLET (40 MG TOTAL) BY MOUTH DAILY. 90 tablet 3  . B-D 3CC LUER-LOK SYR 18GX1-1/2 18G X 1-1/2" 3 ML MISC     . B-D 3CC LUER-LOK SYR 21GX1-1/2 21G X 1-1/2" 3 ML MISC USE WEEKLY FOR DRAWING    . BD DISP NEEDLES 22G X 1-1/2" MISC     . carvedilol (COREG) 3.125 MG tablet Take 1 tablet (3.125 mg total) by mouth 2 (two) times daily with a meal. 60 tablet 12  . COLLAGEN PO Take 1 Scoop by mouth daily. Collagen powder    . Cyanocobalamin (B-12 PO) Take 1 tablet by mouth daily with lunch.    Marland Kitchen FARXIGA 10 MG TABS tablet TAKE 1 TABLET BY MOUTH ONCE DAILY 90 tablet 5  . MAGNESIUM PO Take 1 tablet by mouth daily with lunch.    . Melatonin 10 MG CAPS Take 30 mg by mouth at bedtime as needed (sleep).    . Multiple Vitamins-Minerals (MULTIVITAMIN ADULT) TABS Take 1 tablet by mouth daily with lunch.     . Multiple Vitamins-Minerals (ZINC PO) Take 1 capsule by mouth daily with lunch.    . Omega-3 Fatty Acids (FISH OIL) 1000 MG CAPS Take 1,000 mg by mouth daily with lunch.     Marland Kitchen  POTASSIUM PO Take 1 tablet by mouth daily with lunch.    . Probiotic CAPS Take 2 capsules by mouth daily.    . sildenafil (REVATIO) 20 MG tablet Take 20 mg by mouth daily as needed (ED).     Marland Kitchen testosterone cypionate (DEPOTESTOTERONE CYPIONATE) 100 MG/ML injection Inject 80 mg into the muscle every Wednesday.       Allergies Allergies  Allergen Reactions  . Lisinopril Cough    Family History Family History  Problem Relation Age of Onset  . Cancer Neg Hx   . Colon cancer Neg Hx   . Esophageal cancer Neg Hx   . Rectal cancer Neg Hx   . Stomach cancer Neg Hx     Review of Systems: Constitutional:  no fevers Eye:  no recent significant change in vision Ear/Nose/Mouth/Throat:  Ears:  no hearing loss Nose/Mouth/Throat:  no complaints of nasal congestion, no sore throat Cardiovascular:  no chest pain, no palpitations Respiratory:  no cough and  no shortness of breath Gastrointestinal:  no abdominal pain, no change in bowel habits GU:  Male: negative for dysuria, frequency, and incontinence and negative for prostate symptoms Musculoskeletal/Extremities:  no new pain, redness, or swelling of the joints Integumentary (Skin/Breast):  no abnormal skin lesions reported Neurologic:  no headaches Endocrine: No unexpected weight changes Hematologic/Lymphatic:  no abnormal bleeding  Exam BP 100/82 (BP Location: Left Arm, Patient Position: Sitting, Cuff Size: Large)   Pulse 97   Temp (!) 95.9 F (35.5 C) (Temporal)   Ht 6\' 1"  (1.854 m)   Wt 216 lb 8 oz (98.2 kg)   SpO2 97%   BMI 28.56 kg/m  General:  well developed, well nourished, in no apparent distress Skin:  no significant moles, warts, or growths Head:  no masses, lesions, or tenderness Eyes:  pupils equal and round, sclera anicteric without injection Ears:  canals without lesions, TMs shiny without retraction, no obvious effusion, no erythema Nose:  nares patent, septum midline, mucosa normal Throat/Pharynx:  lips and gingiva without lesion; tongue and uvula midline; non-inflamed pharynx; no exudates or postnasal drainage Neck: neck supple without adenopathy, thyromegaly, or masses Cardiac: RRR, no bruits, no LE edema Lungs:  clear to auscultation, breath sounds equal bilaterally, no respiratory distress Rectal: Deferred Musculoskeletal:  symmetrical muscle groups noted without atrophy or deformity Neuro:  gait normal; deep tendon reflexes normal and symmetric Psych: well oriented with normal range of affect and appropriate judgment/insight  Assessment and Plan  Well adult exam - Plan: CBC, Comprehensive metabolic panel, Lipid panel  Type 2 diabetes mellitus with hyperglycemia, without long-term current use of insulin (HCC) - Plan: Hemoglobin A1c, Microalbumin / creatinine urine ratio  Screening for prostate cancer - Plan: PSA   Well 59 y.o. male. Counseled on diet and  exercise. Counseled on risks and benefits of prostate cancer screening with PSA. The patient agrees to undergo testing. Immunizations, labs, and further orders as above. Follow up in 3-6 mo pending above. The patient voiced understanding and agreement to the plan.  77 Cowan, DO 10/16/19 2:03 PM

## 2019-10-17 ENCOUNTER — Encounter: Payer: Self-pay | Admitting: Family Medicine

## 2019-10-17 LAB — COMPREHENSIVE METABOLIC PANEL
ALT: 39 U/L (ref 0–53)
AST: 53 U/L — ABNORMAL HIGH (ref 0–37)
Albumin: 4.6 g/dL (ref 3.5–5.2)
Alkaline Phosphatase: 65 U/L (ref 39–117)
BUN: 20 mg/dL (ref 6–23)
CO2: 28 mEq/L (ref 19–32)
Calcium: 9.9 mg/dL (ref 8.4–10.5)
Chloride: 99 mEq/L (ref 96–112)
Creatinine, Ser: 1.51 mg/dL — ABNORMAL HIGH (ref 0.40–1.50)
GFR: 57.05 mL/min — ABNORMAL LOW (ref >60.00)
Glucose, Bld: 92 mg/dL (ref 70–99)
Potassium: 4.4 mEq/L (ref 3.5–5.1)
Sodium: 138 mEq/L (ref 135–145)
Total Bilirubin: 3 mg/dL — ABNORMAL HIGH (ref 0.2–1.2)
Total Protein: 7.2 g/dL (ref 6.0–8.3)

## 2019-10-17 LAB — PSA: PSA: 0.53 ng/mL (ref 0.10–4.00)

## 2019-10-17 LAB — LIPID PANEL
Cholesterol: 112 mg/dL (ref 0–200)
HDL: 36.1 mg/dL — ABNORMAL LOW (ref >39.00)
LDL Cholesterol: 57 mg/dL (ref 0–99)
NonHDL: 76.23
Total CHOL/HDL Ratio: 3
Triglycerides: 96 mg/dL (ref 0.0–149.0)
VLDL: 19.2 mg/dL (ref 0.0–40.0)

## 2019-10-17 LAB — CBC
HCT: 47.9 % (ref 39.0–52.0)
Hemoglobin: 16.3 g/dL (ref 13.0–17.0)
MCHC: 33.9 g/dL (ref 30.0–36.0)
MCV: 89.9 fl (ref 78.0–100.0)
Platelets: 193 10*3/uL (ref 150.0–400.0)
RBC: 5.33 Mil/uL (ref 4.22–5.81)
RDW: 13.8 % (ref 11.5–15.5)
WBC: 4.5 10*3/uL (ref 4.0–10.5)

## 2019-10-17 LAB — HEMOGLOBIN A1C: Hgb A1c MFr Bld: 8.2 % — ABNORMAL HIGH (ref 4.6–6.5)

## 2019-10-18 ENCOUNTER — Encounter: Payer: Self-pay | Admitting: Cardiology

## 2019-10-18 ENCOUNTER — Ambulatory Visit (INDEPENDENT_AMBULATORY_CARE_PROVIDER_SITE_OTHER): Payer: No Typology Code available for payment source | Admitting: Cardiology

## 2019-10-18 ENCOUNTER — Other Ambulatory Visit: Payer: Self-pay

## 2019-10-18 VITALS — BP 130/90 | HR 74 | Ht 72.0 in | Wt 216.0 lb

## 2019-10-18 DIAGNOSIS — I2510 Atherosclerotic heart disease of native coronary artery without angina pectoris: Secondary | ICD-10-CM | POA: Diagnosis not present

## 2019-10-18 DIAGNOSIS — I10 Essential (primary) hypertension: Secondary | ICD-10-CM | POA: Diagnosis not present

## 2019-10-18 DIAGNOSIS — I429 Cardiomyopathy, unspecified: Secondary | ICD-10-CM

## 2019-10-18 DIAGNOSIS — E088 Diabetes mellitus due to underlying condition with unspecified complications: Secondary | ICD-10-CM | POA: Diagnosis not present

## 2019-10-18 DIAGNOSIS — N289 Disorder of kidney and ureter, unspecified: Secondary | ICD-10-CM

## 2019-10-18 DIAGNOSIS — Q600 Renal agenesis, unilateral: Secondary | ICD-10-CM

## 2019-10-18 DIAGNOSIS — E1165 Type 2 diabetes mellitus with hyperglycemia: Secondary | ICD-10-CM

## 2019-10-18 DIAGNOSIS — IMO0002 Reserved for concepts with insufficient information to code with codable children: Secondary | ICD-10-CM

## 2019-10-18 DIAGNOSIS — E782 Mixed hyperlipidemia: Secondary | ICD-10-CM

## 2019-10-18 NOTE — Progress Notes (Signed)
Cardiology Office Note:    Date:  10/18/2019   ID:  Michael Guerra, DOB 12/20/1960, MRN [id number]  PCP:  Sharlene Dory, DO  Cardiologist:  Garwin Brothers, MD   Referring MD: Sharlene Dory*    ASSESSMENT:    1. Coronary artery disease involving native coronary artery of native heart without angina pectoris   2. Cardiomyopathy, unspecified type (HCC)   3. Essential hypertension   4. Diabetes mellitus due to underlying condition with unspecified complications (HCC)   5. Mixed dyslipidemia   6. Renal insufficiency   7. Solitary kidney   8. Type 2 diabetes mellitus with hyperglycemia, without long-term current use of insulin (HCC)    PLAN:    In order of problems listed above:  1. Coronary artery disease: Secondary prevention stressed with the patient.  Importance of compliance with diet and medication stressed and he vocalized understanding.  He has good exercise tolerance and exercises regularly and I congratulated him for the same. 2. Cardiomyopathy: Ejection fraction is 35 to 40%.  I would like to use medication such as Entresto among other newer group of medications however I am not comfortable doing this in the presence of a solitary kidney and renal dysfunction.  In view of this I will refer him to our congestive heart failure clinic and our colleagues will assist in this part of the management.  I discussed this with the patient and he agrees. 3. Mixed dyslipidemia: Diet was emphasized and he will have blood work today including fasting lipids. 4. Essential hypertension: Blood pressure stable he has not taken his medications this morning and he mentioned his numbers and they are optimal at this time. 5. Patient will be seen in follow-up appointment in 6 months or earlier if the patient has any concerns    Medication Adjustments/Labs and Tests Ordered: Current medicines are reviewed at length with the patient today.  Concerns regarding medicines are  outlined above.  No orders of the defined types were placed in this encounter.  No orders of the defined types were placed in this encounter.    Chief Complaint  Patient presents with   Follow-up     History of Present Illness:    Michael Guerra is a 59 y.o. male.  Patient has past medical history of nonobstructive coronary artery disease, cardiomyopathy with an ejection fraction of 35 to 40%, diabetes mellitus and essential hypertension.  He denies any problems at this time and takes care of activities of daily living.  No chest pain orthopnea or PND.  He exercises on a regular basis.  At the time of my evaluation, the patient is alert awake oriented and in no distress.  Past Medical History:  Diagnosis Date   Blood transfusion without reported diagnosis    CAD (coronary artery disease) 06/08/2019   Cardiomyopathy (HCC) 05/23/2019   Cataract    Chest discomfort 05/12/2019   Chronic kidney disease    one kidney that is healthy   Diabetes mellitus due to underlying condition with unspecified complications (HCC) 05/12/2019   Essential hypertension 05/12/2019   LIVER FUNCTION TESTS, ABNORMAL 05/21/2006   Qualifier: Diagnosis of  By: Maple Hudson MD, Riki Rusk     Mixed dyslipidemia 05/12/2019   Solitary kidney     Past Surgical History:  Procedure Laterality Date   ABDOMINAL SURGERY     KIDNEY SURGERY     bowel obstruction  x 3   LEFT HEART CATH AND CORONARY ANGIOGRAPHY N/A 05/31/2019   Procedure:  LEFT HEART CATH AND CORONARY ANGIOGRAPHY;  Surgeon: Iran Ouch, MD;  Location: [facility];  Service: Cardiovascular;  Laterality: N/A;    Current Medications: Current Meds  Medication Sig   ACCU-CHEK FASTCLIX LANCETS MISC Use as directed twice daily to check blood sugar.   ACCU-CHEK GUIDE test strip USE TO CHECK BLOOD SUGAR 2 TIMES DAILY   acetaminophen (TYLENOL) 500 MG tablet Take 1,000 mg by mouth every 6 (six) hours as needed for moderate pain or headache.    amLODipine (NORVASC) 5 MG tablet Take 1 tablet (5 mg total) by mouth daily.   aspirin 81 MG tablet Take 81 mg by mouth daily.     atorvastatin (LIPITOR) 40 MG tablet TAKE 1 TABLET (40 MG TOTAL) BY MOUTH DAILY.   B-D 3CC LUER-LOK SYR 18GX1-1/2 18G X 1-1/2" 3 ML MISC    B-D 3CC LUER-LOK SYR 21GX1-1/2 21G X 1-1/2" 3 ML MISC USE WEEKLY FOR DRAWING   BD DISP NEEDLES 22G X 1-1/2" MISC    carvedilol (COREG) 3.125 MG tablet Take 1 tablet (3.125 mg total) by mouth 2 (two) times daily with a meal.   COLLAGEN PO Take 1 Scoop by mouth daily. Collagen powder   Cyanocobalamin (B-12 PO) Take 1 tablet by mouth daily with lunch.   FARXIGA 10 MG TABS tablet TAKE 1 TABLET BY MOUTH ONCE DAILY   MAGNESIUM PO Take 1 tablet by mouth daily with lunch.   Melatonin 10 MG CAPS Take 30 mg by mouth at bedtime as needed (sleep).   Multiple Vitamins-Minerals (MULTIVITAMIN ADULT) TABS Take 1 tablet by mouth daily with lunch.    Multiple Vitamins-Minerals (ZINC PO) Take 1 capsule by mouth daily with lunch.   Omega-3 Fatty Acids (FISH OIL) 1000 MG CAPS Take 1,000 mg by mouth daily with lunch.    POTASSIUM PO Take 1 tablet by mouth daily with lunch.   Probiotic CAPS Take 2 capsules by mouth daily.   sildenafil (REVATIO) 20 MG tablet Take 20 mg by mouth daily as needed (ED).    testosterone cypionate (DEPOTESTOTERONE CYPIONATE) 100 MG/ML injection Inject 80 mg into the muscle every Wednesday.      Allergies:   Lisinopril   Social History   Socioeconomic History   Marital status: Married    Spouse name: Not on file   Number of children: Not on file   Years of education: Not on file   Highest education level: Not on file  Occupational History   Not on file  Tobacco Use   Smoking status: Never Smoker   Smokeless tobacco: Never Used  Substance and Sexual Activity   Alcohol use: No   Drug use: No   Sexual activity: Not on file  Other Topics Concern   Not on file  Social History  Narrative   Not on file   Social Determinants of Health   Financial Resource Strain:    Difficulty of Paying Living Expenses:   Food Insecurity:    Worried About Running Out of Food in the Last Year:    Barista in the Last Year:   Transportation Needs:    Freight forwarder (Medical):    Lack of Transportation (Non-Medical):   Physical Activity:    Days of Exercise per Week:    Minutes of Exercise per Session:   Stress:    Feeling of Stress :   Social Connections:    Frequency of Communication with Friends and Family:    Frequency of  Social Gatherings with Friends and Family:    Attends Religious Services:    Active Member of Clubs or Organizations:    Attends Engineer, structural:    Marital Status:      Family History: The patient's family history is negative for Cancer, Colon cancer, Esophageal cancer, Rectal cancer, and Stomach cancer.  ROS:   Please see the history of present illness.    All other systems reviewed and are negative.  EKGs/Labs/Other Studies Reviewed:    The following studies were reviewed today: I discussed my findings with the patient at extensive length.   Recent Labs: 10/16/2019: ALT 39; BUN 20; Creatinine, Ser 1.51; Hemoglobin 16.3; Platelets 193.0; Potassium 4.4; Sodium 138  Recent Lipid Panel    Component Value Date/Time   CHOL 112 10/16/2019 1414   TRIG 96.0 10/16/2019 1414   HDL 36.10 (L) 10/16/2019 1414   CHOLHDL 3 10/16/2019 1414   VLDL 19.2 10/16/2019 1414   LDLCALC 57 10/16/2019 1414   LDLDIRECT 97.0 10/18/2017 0941    Physical Exam:    VS:  Ht 6' (1.829 m)    BMI 29.36 kg/m     Wt Readings from Last 3 Encounters:  10/16/19 216 lb 8 oz (98.2 kg)  08/31/19 219 lb (99.3 kg)  06/22/19 227 lb (103 kg)     GEN: Patient is in no acute distress HEENT: Normal NECK: No JVD; No carotid bruits LYMPHATICS: No lymphadenopathy CARDIAC: Hear sounds regular, 2/6 systolic murmur at the  apex. RESPIRATORY:  Clear to auscultation without rales, wheezing or rhonchi  ABDOMEN: Soft, non-tender, non-distended MUSCULOSKELETAL:  No edema; No deformity  SKIN: Warm and dry NEUROLOGIC:  Alert and oriented x 3 PSYCHIATRIC:  Normal affect   Signed, Garwin Brothers, MD  10/18/2019 8:19 AM    [facility]

## 2019-10-18 NOTE — Patient Instructions (Signed)
Medication Instructions:  No medication changes. *If you need a refill on your cardiac medications before your next appointment, please call your pharmacy*   Lab Work: Your physician recommends that you have labs done in the office today. Your test included  basic metabolic panel, complete blood count, TSH, magnesium, liver function and lipids.  If you have labs (blood work) drawn today and your tests are completely normal, you will receive your results only by: Marland Kitchen MyChart Message (if you have MyChart) OR . A paper copy in the mail If you have any lab test that is abnormal or we need to change your treatment, we will call you to review the results.   Testing/Procedures: None ordered   Follow-Up: At [facility], you and your health needs are our priority.  As part of our continuing mission to provide you with exceptional heart care, we have created designated Provider Care Teams.  These Care Teams include your primary Cardiologist (physician) and Advanced Practice Providers (APPs -  Physician Assistants and Nurse Practitioners) who all work together to provide you with the care you need, when you need it.  We recommend signing up for the patient portal called "MyChart".  Sign up information is provided on this After Visit Summary.  MyChart is used to connect with patients for Virtual Visits (Telemedicine).  Patients are able to view lab/test results, encounter notes, upcoming appointments, etc.  Non-urgent messages can be sent to your provider as well.   To learn more about what you can do with MyChart, go to [email or web address].    Your next appointment:   4 month(s)  The format for your next appointment:   In Person  Provider:   Belva Crome, MD   Other Instructions Referral to the CHF clinic has been made.

## 2019-10-19 LAB — LIPID PANEL
Chol/HDL Ratio: 3.1 ratio (ref 0.0–5.0)
Cholesterol, Total: 115 mg/dL (ref 100–199)
HDL: 37 mg/dL — ABNORMAL LOW (ref >39)
LDL Chol Calc (NIH): 60 mg/dL (ref 0–99)
Triglycerides: 92 mg/dL (ref 0–149)
VLDL Cholesterol Cal: 18 mg/dL (ref 5–40)

## 2019-10-19 LAB — CBC WITH DIFFERENTIAL/PLATELET
Basophils Absolute: 0 x10E3/uL (ref 0.0–0.2)
Basos: 1 %
EOS (ABSOLUTE): 0.1 x10E3/uL (ref 0.0–0.4)
Eos: 4 %
Hematocrit: 48.5 % (ref 37.5–51.0)
Hemoglobin: 16.8 g/dL (ref 13.0–17.7)
Immature Grans (Abs): 0 x10E3/uL (ref 0.0–0.1)
Immature Granulocytes: 0 %
Lymphocytes Absolute: 1.3 x10E3/uL (ref 0.7–3.1)
Lymphs: 48 %
MCH: 30 pg (ref 26.6–33.0)
MCHC: 34.6 g/dL (ref 31.5–35.7)
MCV: 87 fL (ref 79–97)
Monocytes Absolute: 0.3 x10E3/uL (ref 0.1–0.9)
Monocytes: 12 %
Neutrophils Absolute: 1 x10E3/uL — ABNORMAL LOW (ref 1.4–7.0)
Neutrophils: 35 %
Platelets: 187 x10E3/uL (ref 150–450)
RBC: 5.6 x10E6/uL (ref 4.14–5.80)
RDW: 13.4 % (ref 11.6–15.4)
WBC: 2.8 x10E3/uL — ABNORMAL LOW (ref 3.4–10.8)

## 2019-10-19 LAB — BASIC METABOLIC PANEL WITH GFR
BUN/Creatinine Ratio: 11 (ref 10–24)
BUN: 16 mg/dL (ref 8–27)
CO2: 24 mmol/L (ref 20–29)
Calcium: 9.5 mg/dL (ref 8.6–10.2)
Chloride: 100 mmol/L (ref 96–106)
Creatinine, Ser: 1.51 mg/dL — ABNORMAL HIGH (ref 0.76–1.27)
GFR calc Af Amer: 57 mL/min/1.73 — ABNORMAL LOW
GFR calc non Af Amer: 49 mL/min/1.73 — ABNORMAL LOW
Glucose: 130 mg/dL — ABNORMAL HIGH (ref 65–99)
Potassium: 4.6 mmol/L (ref 3.5–5.2)
Sodium: 136 mmol/L (ref 134–144)

## 2019-10-19 LAB — HEPATIC FUNCTION PANEL
ALT: 39 IU/L (ref 0–44)
AST: 59 IU/L — ABNORMAL HIGH (ref 0–40)
Albumin: 4.5 g/dL (ref 3.8–4.8)
Alkaline Phosphatase: 76 IU/L (ref 48–121)
Bilirubin Total: 2.1 mg/dL — ABNORMAL HIGH (ref 0.0–1.2)
Bilirubin, Direct: 0.32 mg/dL (ref 0.00–0.40)
Total Protein: 6.9 g/dL (ref 6.0–8.5)

## 2019-10-19 LAB — TSH: TSH: 2.15 u[IU]/mL (ref 0.450–4.500)

## 2019-10-19 LAB — MAGNESIUM: Magnesium: 2 mg/dL (ref 1.6–2.3)

## 2019-10-19 MED FILL — TESTOSTERON CYP 1,000 MG/10: 100 | 80 days supply | Qty: 10 | Fill #1

## 2019-10-27 ENCOUNTER — Encounter: Payer: No Typology Code available for payment source | Admitting: Family Medicine

## 2019-11-06 MED FILL — CARVEDILOL 3.125 MG TABLET: 3.125 | 30 days supply | Qty: 60 | Fill #5

## 2019-11-09 MED FILL — BD 3 ML SYRINGE 18GX1-1/2: 18G X 1-1/2 | 28 days supply | Qty: 4 | Fill #3

## 2019-11-09 MED FILL — BD NEEDLES 22GX1.5: 22G X 1-1/2 | 28 days supply | Qty: 4 | Fill #3

## 2019-11-10 NOTE — Telephone Encounter (Signed)
Please advise 

## 2019-11-14 ENCOUNTER — Telehealth: Payer: Self-pay

## 2019-11-14 ENCOUNTER — Other Ambulatory Visit: Payer: Self-pay | Admitting: Family Medicine

## 2019-11-14 MED ORDER — DEXCOM G6 RECEIVER DEVI: 0 refills | Status: DC

## 2019-11-14 MED ORDER — DEXCOM G6 SENSOR MISC: 0 refills | Status: DC

## 2019-11-14 MED ORDER — DEXCOM G6 TRANSMITTER MISC: 0 refills | Status: DC

## 2019-11-14 NOTE — Telephone Encounter (Signed)
PA's initiated for Dexcom kit. Awaiting determination.

## 2019-11-28 NOTE — Telephone Encounter (Signed)
Plz inform patient. I am assuming the Freestyle was not covered either. Ty.

## 2019-11-28 NOTE — Telephone Encounter (Signed)
Dexcom denied by insurance after completing PA. For approval he must perform at least 4 finger sticks per day, or if he is on an insulin pump, uses 3 or more injections or insulin daily.  Please sent alternative option to patients pharmacy or you may appeal the denial.

## 2019-11-28 NOTE — Telephone Encounter (Signed)
Called informed of denial/and reason. The patient was ok with reasons and said he already has a meter and will continue using what he has .

## 2019-11-28 NOTE — Telephone Encounter (Signed)
Advise on alternative 

## 2019-11-30 MED FILL — CARVEDILOL 3.125 MG TABLET: 3.125 | 30 days supply | Qty: 60 | Fill #6

## 2019-11-30 MED FILL — BD NEEDLES 22GX1.5: 22G X 1-1/2 | 28 days supply | Qty: 4 | Fill #4

## 2019-11-30 MED FILL — BD 3 ML SYRINGE 18GX1-1/2: 18G X 1-1/2 | 28 days supply | Qty: 4 | Fill #4

## 2019-12-11 MED FILL — SILDENAFIL CITRATE 20 MG TA: 20 | 6 days supply | Qty: 50 | Fill #0

## 2019-12-29 MED FILL — FREESTYLE LITE TEST STRIP: 50 days supply | Qty: 100 | Fill #0

## 2020-01-04 ENCOUNTER — Other Ambulatory Visit: Payer: Self-pay | Admitting: Nephrology

## 2020-01-04 DIAGNOSIS — N1831 Chronic kidney disease, stage 3a: Secondary | ICD-10-CM

## 2020-01-08 MED FILL — FREESTYLE LITE METER: W/DEVICE | 30 days supply | Qty: 1 | Fill #0

## 2020-01-08 MED FILL — FREESTYLE LANCETS: 50 days supply | Qty: 100 | Fill #0

## 2020-01-09 ENCOUNTER — Encounter (HOSPITAL_COMMUNITY): Payer: Self-pay

## 2020-01-09 ENCOUNTER — Ambulatory Visit (HOSPITAL_COMMUNITY)
Admission: RE | Admit: 2020-01-09 | Discharge: 2020-01-09 | Disposition: A | Discharge status: Home / Self Care | Payer: No Typology Code available for payment source | Source: Ambulatory Visit | Attending: Family Medicine | Admitting: Family Medicine

## 2020-01-09 ENCOUNTER — Other Ambulatory Visit: Payer: Self-pay

## 2020-01-09 VITALS — BP 153/91 | HR 87 | Temp 98.4°F | Resp 16 | Ht 73.0 in | Wt 218.0 lb

## 2020-01-09 DIAGNOSIS — L309 Dermatitis, unspecified: Secondary | ICD-10-CM

## 2020-01-09 MED ORDER — TRIAMCINOLONE ACETONIDE 0.1 % EX CREA
1.0000 | TOPICAL_CREAM | Freq: Two times a day (BID) | CUTANEOUS | 0 refills | Status: DC
Start: 2020-01-09 — End: 2020-08-19

## 2020-01-09 MED FILL — TRIAMCINOLONE ACETONIDE 0.1: 0.1 | 15 days supply | Qty: 30 | Fill #0

## 2020-01-09 MED FILL — FARXIGA 10 MG TABLET: 10 | 90 days supply | Qty: 90 | Fill #2

## 2020-01-09 NOTE — ED Triage Notes (Signed)
Appt. Pt c/o bumps around nipples bilatx4 mos. Pt has dark spots around nipples bilat.

## 2020-01-10 NOTE — ED Provider Notes (Signed)
[facility]   [id number] 01/09/20 Arrival Time: 1349  ASSESSMENT & PLAN:  1. Nipple dermatitis    No signs of infection. No masses on examination. Ques exact etiology; is bilateral. Question some type of dermatitis with itch/scratch cycle.  Begin trial of: Meds ordered this encounter  Medications  . triamcinolone cream (KENALOG) 0.1 %    Sig: Apply 1 application topically 2 (two) times daily.    Dispense:  30 g    Refill:  0     Follow-up Information    Sharlene Dory, DO.   Specialty: Family Medicine Why: Keep your scheduled follow up. Contact information: [address] [place] [address] [phone]                Will follow up with PCP or here if worsening or failing to improve as anticipated. Reviewed expectations re: course of current medical issues. Questions answered. Outlined signs and symptoms indicating need for more acute intervention. Patient verbalized understanding. After Visit Summary given.   SUBJECTIVE:  Michael Guerra is a 59 y.o. male who presents with a skin complaint. Itching with occasional red and "blister-like" rash around bilateral nipples. Approx 4 months now. Feels like he scratches area a lot. No swelling around nipples. Afebrile. Otherwise well.    OBJECTIVE: Vitals:   01/09/20 1403 01/09/20 1408  BP: (!) 153/91   Pulse: 87   Resp: 16   Temp: 98.4 F (36.9 C)   TempSrc: Oral   SpO2: 98%   Weight:  98.9 kg  Height:  6\' 1"  (1.854 m)    General appearance: alert; no distress HEENT: [place]; AT Neck: supple with FROM Lungs: clear to auscultation bilaterally Heart: regular rate and rhythm Extremities: no edema; moves all extremities normally Skin: warm and dry; slightly thickened and dry skin around both nipples without skin erythema; no bleeding or drainage; no masses appreciated Psychological: alert and cooperative; normal mood and affect  Allergies  Allergen Reactions  . Lisinopril  Cough    Past Medical History:  Diagnosis Date  . Blood transfusion without reported diagnosis   . CAD (coronary artery disease) 06/08/2019  . Cardiomyopathy (HCC) 05/23/2019  . Cataract   . Chest discomfort 05/12/2019  . Chronic kidney disease    one kidney that is healthy  . Diabetes mellitus due to underlying condition with unspecified complications (HCC) 05/12/2019  . Essential hypertension 05/12/2019  . LIVER FUNCTION TESTS, ABNORMAL 05/21/2006   Qualifier: Diagnosis of  By: 07/20/2006 MD, Maple Hudson    . Mixed dyslipidemia 05/12/2019  . Solitary kidney    Social History   Socioeconomic History  . Marital status: Married    Spouse name: Not on file  . Number of children: Not on file  . Years of education: Not on file  . Highest education level: Not on file  Occupational History  . Not on file  Tobacco Use  . Smoking status: Never Smoker  . Smokeless tobacco: Never Used  Substance and Sexual Activity  . Alcohol use: No  . Drug use: No  . Sexual activity: Not on file  Other Topics Concern  . Not on file  Social History Narrative  . Not on file   Social Determinants of Health   Financial Resource Strain:   . Difficulty of Paying Living Expenses: Not on file  Food Insecurity:   . Worried About 05/14/2019 in the Last Year: Not on file  . Ran Out of Food  in the Last Year: Not on file  Transportation Needs:   . Lack of Transportation (Medical): Not on file  . Lack of Transportation (Non-Medical): Not on file  Physical Activity:   . Days of Exercise per Week: Not on file  . Minutes of Exercise per Session: Not on file  Stress:   . Feeling of Stress : Not on file  Social Connections:   . Frequency of Communication with Friends and Family: Not on file  . Frequency of Social Gatherings with Friends and Family: Not on file  . Attends Religious Services: Not on file  . Active Member of Clubs or Organizations: Not on file  . Attends Banker Meetings: Not on  file  . Marital Status: Not on file  Intimate Partner Violence:   . Fear of Current or Ex-Partner: Not on file  . Emotionally Abused: Not on file  . Physically Abused: Not on file  . Sexually Abused: Not on file   Family History  Problem Relation Age of Onset  . Cancer Neg Hx   . Colon cancer Neg Hx   . Esophageal cancer Neg Hx   . Rectal cancer Neg Hx   . Stomach cancer Neg Hx    Past Surgical History:  Procedure Laterality Date  . ABDOMINAL SURGERY    . KIDNEY SURGERY     bowel obstruction  x 3  . LEFT HEART CATH AND CORONARY ANGIOGRAPHY N/A 05/31/2019   Procedure: LEFT HEART CATH AND CORONARY ANGIOGRAPHY;  Surgeon: Iran Ouch, MD;  Location: [facility];  Service: Cardiovascular;  Laterality: N/A;     Mardella Layman, MD 01/10/20 1050

## 2020-01-12 ENCOUNTER — Other Ambulatory Visit: Payer: Self-pay | Admitting: Nephrology

## 2020-01-12 ENCOUNTER — Ambulatory Visit
Admission: RE | Admit: 2020-01-12 | Discharge: 2020-01-12 | Disposition: A | Discharge status: Home / Self Care | Payer: No Typology Code available for payment source | Source: Ambulatory Visit | Attending: Nephrology | Admitting: Nephrology

## 2020-01-12 DIAGNOSIS — N1831 Chronic kidney disease, stage 3a: Secondary | ICD-10-CM

## 2020-01-15 ENCOUNTER — Ambulatory Visit: Payer: No Typology Code available for payment source | Admitting: Family Medicine

## 2020-01-16 MED FILL — CARVEDILOL 3.125 MG TABLET: 3.125 | 30 days supply | Qty: 60 | Fill #7

## 2020-01-16 MED FILL — ATORVASTATIN 40 MG TABLET: 40 | 90 days supply | Qty: 90 | Fill #3

## 2020-01-16 MED FILL — AMLODIPINE BESYLATE 5 MG TA: 5 | 90 days supply | Qty: 90 | Fill #1

## 2020-01-17 ENCOUNTER — Ambulatory Visit: Payer: No Typology Code available for payment source | Admitting: Family Medicine

## 2020-01-22 ENCOUNTER — Telehealth: Payer: Self-pay | Admitting: Cardiology

## 2020-01-22 NOTE — Telephone Encounter (Signed)
New Message   Pt c/o medication issue:  1. Name of Medication: Entresto   2. How are you currently taking this medication (dosage and times per day)? Not currently taking   3. Are you having a reaction (difficulty breathing--STAT)? No   4. What is your medication issue? Pt saw the Kidney Specialist on 01/03/20 and a sonogram on 01/12/20. He says Dr Tomie China wanted him to have an appointment with the CHF clinic to see if he should take this medication. He says he saw the Kidney Specialist and they told him not to take the medication, so he is not taking it. He is now wondering if he needs to still be seen at the CHF Clinic or cancel the appointment?   Please advise

## 2020-01-23 ENCOUNTER — Encounter (HOSPITAL_COMMUNITY): Payer: No Typology Code available for payment source | Admitting: Cardiology

## 2020-01-23 NOTE — Telephone Encounter (Signed)
Keep appointment with the CHF clinic.

## 2020-01-23 NOTE — Telephone Encounter (Signed)
Pt advised to keep the appointment with the CHF clinic. Pt verbalized understanding and had no additional questions.

## 2020-01-25 ENCOUNTER — Other Ambulatory Visit (HOSPITAL_BASED_OUTPATIENT_CLINIC_OR_DEPARTMENT_OTHER): Payer: Self-pay | Admitting: Urology

## 2020-01-29 MED FILL — BD 3 ML SYRINGE 18GX1-1/2: 18G X 1-1/2 | 28 days supply | Qty: 4 | Fill #0

## 2020-01-29 MED FILL — BD NEEDLES 22GX1.5: 22G X 1-1/2 | 28 days supply | Qty: 4 | Fill #0

## 2020-01-29 MED FILL — TESTOSTERON CYP 1,000 MG/10: 100 | 28 days supply | Qty: 10 | Fill #0

## 2020-02-05 ENCOUNTER — Other Ambulatory Visit: Payer: Self-pay

## 2020-02-05 ENCOUNTER — Ambulatory Visit (INDEPENDENT_AMBULATORY_CARE_PROVIDER_SITE_OTHER): Payer: No Typology Code available for payment source | Admitting: Family Medicine

## 2020-02-05 ENCOUNTER — Other Ambulatory Visit: Payer: Self-pay | Admitting: Family Medicine

## 2020-02-05 ENCOUNTER — Encounter: Payer: Self-pay | Admitting: Family Medicine

## 2020-02-05 VITALS — BP 110/78 | HR 95 | Temp 98.4°F | Ht 73.0 in | Wt 207.5 lb

## 2020-02-05 DIAGNOSIS — G8929 Other chronic pain: Secondary | ICD-10-CM

## 2020-02-05 DIAGNOSIS — M25511 Pain in right shoulder: Secondary | ICD-10-CM

## 2020-02-05 DIAGNOSIS — M25512 Pain in left shoulder: Secondary | ICD-10-CM

## 2020-02-05 DIAGNOSIS — E1165 Type 2 diabetes mellitus with hyperglycemia: Secondary | ICD-10-CM

## 2020-02-05 HISTORY — DX: Other chronic pain: G89.29

## 2020-02-05 HISTORY — DX: Pain in left shoulder: M25.512

## 2020-02-05 MED ORDER — METFORMIN HCL ER 500 MG PO TB24: 1000.0000 mg | ORAL_TABLET | Freq: Two times a day (BID) | ORAL | 2 refills | Status: DC

## 2020-02-05 MED FILL — metFORMIN HCL ER 500 MG TB2: 500 | 90 days supply | Qty: 360 | Fill #0

## 2020-02-05 NOTE — Patient Instructions (Addendum)
Give [place] 2-3 business days to get the results of your labs back.   Keep the diet clean and stay active.  Please have your eye provider send your office notes to our clinic.  Please check your sugars at home again.  For metformin, started with a tab daily for a week. Then take 2 tabs daily for a week. Then add an afternoon tab for a week, and finally take 2 tabs twice daily.   Let us know if you need anything.  EXERCISES  RANGE OF MOTION (ROM) AND STRETCHING EXERCISES These exercises may help you when beginning to rehabilitate your injury. While completing these exercises, remember:   Restoring tissue flexibility helps normal motion to return to the joints. This allows healthier, less painful movement and activity.  An effective stretch should be held for at least 30 seconds.  A stretch should never be painful. You should only feel a gentle lengthening or release in the stretched tissue.  ROM - Pendulum  Bend at the waist so that your right / left arm falls away from your body. Support yourself with your opposite hand on a solid surface, such as a table or a countertop.  Your right / left arm should be perpendicular to the ground. If it is not perpendicular, you need to lean over farther. Relax the muscles in your right / left arm and shoulder as much as possible.  Gently sway your hips and trunk so they move your right / left arm without any use of your right / left shoulder muscles.  Progress your movements so that your right / left arm moves side to side, then forward and backward, and finally, both clockwise and counterclockwise.  Complete 10-15 repetitions in each direction. Many people use this exercise to relieve discomfort in their shoulder as well as to gain range of motion. Repeat 2 times. Complete this exercise 3 times per week.  STRETCH - Flexion, Standing  Stand with good posture. With an underhand grip on your right / left hand and an overhand grip on the opposite  hand, grasp a broomstick or cane so that your hands are a little more than shoulder-width apart.  Keeping your right / left elbow straight and shoulder muscles relaxed, push the stick with your opposite hand to raise your right / left arm in front of your body and then overhead. Raise your arm until you feel a stretch in your right / left shoulder, but before you have increased shoulder pain.  Try to avoid shrugging your right / left shoulder as your arm rises by keeping your shoulder blade tucked down and toward your mid-back spine. Hold 30 seconds.  Slowly return to the starting position. Repeat 2 times. Complete this exercise 3 times per week.  STRETCH - Internal Rotation  Place your right / left hand behind your back, palm-up.  Throw a towel or belt over your opposite shoulder. Grasp the towel/belt with your right / left hand.  While keeping an upright posture, gently pull up on the towel/belt until you feel a stretch in the front of your right / left shoulder.  Avoid shrugging your right / left shoulder as your arm rises by keeping your shoulder blade tucked down and toward your mid-back spine.  Hold 30. Release the stretch by lowering your opposite hand. Repeat 2 times. Complete this exercise 3 times per week.  STRETCH - External Rotation and Abduction  Stagger your stance through a doorframe. It does not matter which foot is  forward.  As instructed by your physician, physical therapist or athletic trainer, place your hands: ? And forearms above your head and on the door frame. ? And forearms at head-height and on the door frame. ? At elbow-height and on the door frame.  Keeping your head and chest upright and your stomach muscles tight to prevent over-extending your low-back, slowly shift your weight onto your front foot until you feel a stretch across your chest and/or in the front of your shoulders.  Hold 30 seconds. Shift your weight to your back foot to release the  stretch. Repeat 2 times. Complete this stretch 3 times per week.   STRENGTHENING EXERCISES  These exercises may help you when beginning to rehabilitate your injury. They may resolve your symptoms with or without further involvement from your physician, physical therapist or athletic trainer. While completing these exercises, remember:   Muscles can gain both the endurance and the strength needed for everyday activities through controlled exercises.  Complete these exercises as instructed by your physician, physical therapist or athletic trainer. Progress the resistance and repetitions only as guided.  You may experience muscle soreness or fatigue, but the pain or discomfort you are trying to eliminate should never worsen during these exercises. If this pain does worsen, stop and make certain you are following the directions exactly. If the pain is still present after adjustments, discontinue the exercise until you can discuss the trouble with your clinician.  If advised by your physician, during your recovery, avoid activity or exercises which involve actions that place your right / left hand or elbow above your head or behind your back or head. These positions stress the tissues which are trying to heal.  STRENGTH - Scapular Depression and Adduction  With good posture, sit on a firm chair. Supported your arms in front of you with pillows, arm rests or a table top. Have your elbows in line with the sides of your body.  Gently draw your shoulder blades down and toward your mid-back spine. Gradually increase the tension without tensing the muscles along the top of your shoulders and the back of your neck.  Hold for 3 seconds. Slowly release the tension and relax your muscles completely before completing the next repetition.  After you have practiced this exercise, remove the arm support and complete it in standing as well as sitting. Repeat 2 times. Complete this exercise 3 times per week.    STRENGTH - External Rotators  Secure a rubber exercise band/tubing to a fixed object so that it is at the same height as your right / left elbow when you are standing or sitting on a firm surface.  Stand or sit so that the secured exercise band/tubing is at your side that is not injured.  Bend your elbow 90 degrees. Place a folded towel or small pillow under your right / left arm so that your elbow is a few inches away from your side.  Keeping the tension on the exercise band/tubing, pull it away from your body, as if pivoting on your elbow. Be sure to keep your body steady so that the movement is only coming from your shoulder rotating.  Hold 3 seconds. Release the tension in a controlled manner as you return to the starting position. Repeat 2 times. Complete this exercise 3 times per week.   STRENGTH - Supraspinatus  Stand or sit with good posture. Grasp a 2-3 lb weight or an exercise band/tubing so that your hand is "thumbs-up," like when  you shake hands.  Slowly lift your right / left hand from your thigh into the air, traveling about 30 degrees from straight out at your side. Lift your hand to shoulder height or as far as you can without increasing any shoulder pain. Initially, many people do not lift their hands above shoulder height.  Avoid shrugging your right / left shoulder as your arm rises by keeping your shoulder blade tucked down and toward your mid-back spine.  Hold for 3 seconds. Control the descent of your hand as you slowly return to your starting position. Repeat 2 times. Complete this exercise 3 times per week.   STRENGTH - Shoulder Extensors  Secure a rubber exercise band/tubing so that it is at the height of your shoulders when you are either standing or sitting on a firm arm-less chair.  With a thumbs-up grip, grasp an end of the band/tubing in each hand. Straighten your elbows and lift your hands straight in front of you at shoulder height. Step back away from  the secured end of band/tubing until it becomes tense.  Squeezing your shoulder blades together, pull your hands down to the sides of your thighs. Do not allow your hands to go behind you.  Hold for 3 seconds. Slowly ease the tension on the band/tubing as you reverse the directions and return to the starting position. Repeat 2 times. Complete this exercise 3 times per week.   STRENGTH - Scapular Retractors  Secure a rubber exercise band/tubing so that it is at the height of your shoulders when you are either standing or sitting on a firm arm-less chair.  With a palm-down grip, grasp an end of the band/tubing in each hand. Straighten your elbows and lift your hands straight in front of you at shoulder height. Step back away from the secured end of band/tubing until it becomes tense.  Squeezing your shoulder blades together, draw your elbows back as you bend them. Keep your upper arm lifted away from your body throughout the exercise.  Hold 3 seconds. Slowly ease the tension on the band/tubing as you reverse the directions and return to the starting position. Repeat 2 times. Complete this exercise 3 times per week.  STRENGTH - Scapular Depressors  Find a sturdy chair without wheels, such as a from a dining room table.  Keeping your feet on the floor, lift your bottom from the seat and lock your elbows.  Keeping your elbows straight, allow gravity to pull your body weight down. Your shoulders will rise toward your ears.  Raise your body against gravity by drawing your shoulder blades down your back, shortening the distance between your shoulders and ears. Although your feet should always maintain contact with the floor, your feet should progressively support less body weight as you get stronger.  Hold 3 seconds. In a controlled and slow manner, lower your body weight to begin the next repetition. Repeat 2 times. Complete this exercise 3 times per week.   This information is not intended  to replace advice given to you by your health care provider. Make sure you discuss any questions you have with your health care provider.  Document Released: 02/18/2005 Document Revised: 04/27/2014 Document Reviewed: 07/19/2008 Elsevier Interactive Patient Education [employer]

## 2020-02-05 NOTE — Progress Notes (Signed)
Subjective:   Chief Complaint  Patient presents with  . Follow-up    Michael Guerra is a 59 y.o. male here for follow-up of diabetes.   Michael Guerra has not routinely been checking his sugars.  Patient does not require insulin.   Medications include: Farxiga 10 mg/d Diet is not good as of late.  Exercise: none recently  Patient has a 2-year history of right shoulder pain.  He has not been doing anything for it lately.  He used to do push-ups but has not been lately.  Range of motion is normal but he starts having pain towards the end of it.  No neurologic signs or symptoms.  Past Medical History:  Diagnosis Date  . Blood transfusion without reported diagnosis   . CAD (coronary artery disease) 06/08/2019  . Cardiomyopathy (HCC) 05/23/2019  . Cataract   . Chest discomfort 05/12/2019  . Chronic kidney disease    one kidney that is healthy  . Diabetes mellitus due to underlying condition with unspecified complications (HCC) 05/12/2019  . Essential hypertension 05/12/2019  . LIVER FUNCTION TESTS, ABNORMAL 05/21/2006   Qualifier: Diagnosis of  By: Maple Hudson MD, Riki Rusk    . Mixed dyslipidemia 05/12/2019  . Solitary kidney      Related testing: Retinal exam: Scheduled next mo.  Pneumovax: done  Objective:  BP 110/78 (BP Location: Left Arm, Patient Position: Sitting, Cuff Size: Normal)   Pulse 95   Temp 98.4 F (36.9 C) (Oral)   Ht 6\' 1"  (1.854 m)   Wt 207 lb 8 oz (94.1 kg)   SpO2 98%   BMI 27.38 kg/m  General:  Well developed, well nourished, in no apparent distress Skin:  Warm, no pallor or diaphoresis Lungs:  CTAB, no access msc use Cardio:  RRR, no bruits, no LE edema Right shoulder: Normal active and passive range of motion, no deformity or tenderness to palpation; positive Neer's and empty can on the right with ample strength, there is also a positive liftoff, negative crossover, Hawkins, speeds Psych: Age appropriate judgment and insight  Assessment:   Type 2 diabetes mellitus with  hyperglycemia, without long-term current use of insulin (HCC) - Plan: Hemoglobin A1c  Chronic right shoulder pain   Plan:   Status: Uncontrolled. Counseled on diet and exercise. Will likely need to add back Metformin. Could consider GLP-1 also.  Stretches and exercises provided for the right shoulder.  I think his supraspinatus and subscapularis are involved.  Could consider referral to physical therapy, though he just had a child 14 days ago.  Could consider injection.  F/u in 3 mo. The patient voiced understanding and agreement to the plan.  [place], DO 02/05/20 11:57 AM

## 2020-02-06 LAB — HEMOGLOBIN A1C
Hgb A1c MFr Bld: 8.5 % of total Hgb — ABNORMAL HIGH (ref <5.7)
Mean Plasma Glucose: 197 (calc)
eAG (mmol/L): 10.9 (calc)

## 2020-02-20 DIAGNOSIS — Z5189 Encounter for other specified aftercare: Secondary | ICD-10-CM | POA: Insufficient documentation

## 2020-02-20 DIAGNOSIS — N189 Chronic kidney disease, unspecified: Secondary | ICD-10-CM | POA: Insufficient documentation

## 2020-02-20 DIAGNOSIS — H269 Unspecified cataract: Secondary | ICD-10-CM | POA: Insufficient documentation

## 2020-02-21 ENCOUNTER — Other Ambulatory Visit: Payer: Self-pay

## 2020-02-21 ENCOUNTER — Encounter: Payer: Self-pay | Admitting: Cardiology

## 2020-02-21 ENCOUNTER — Ambulatory Visit (INDEPENDENT_AMBULATORY_CARE_PROVIDER_SITE_OTHER): Payer: No Typology Code available for payment source | Admitting: Cardiology

## 2020-02-21 VITALS — BP 130/80 | HR 78 | Ht 73.0 in | Wt 214.0 lb

## 2020-02-21 DIAGNOSIS — I10 Essential (primary) hypertension: Secondary | ICD-10-CM

## 2020-02-21 DIAGNOSIS — I2510 Atherosclerotic heart disease of native coronary artery without angina pectoris: Secondary | ICD-10-CM

## 2020-02-21 DIAGNOSIS — E088 Diabetes mellitus due to underlying condition with unspecified complications: Secondary | ICD-10-CM | POA: Diagnosis not present

## 2020-02-21 DIAGNOSIS — E782 Mixed hyperlipidemia: Secondary | ICD-10-CM

## 2020-02-21 DIAGNOSIS — Z905 Acquired absence of kidney: Secondary | ICD-10-CM

## 2020-02-21 DIAGNOSIS — I429 Cardiomyopathy, unspecified: Secondary | ICD-10-CM | POA: Diagnosis not present

## 2020-02-21 DIAGNOSIS — N289 Disorder of kidney and ureter, unspecified: Secondary | ICD-10-CM

## 2020-02-21 NOTE — Patient Instructions (Signed)

## 2020-02-21 NOTE — Progress Notes (Signed)
Cardiology Office Note:    Date:  02/21/2020   ID:  Michael Guerra, DOB [birth year], MRN [id number]  PCP:  Sharlene Dory, DO  Cardiologist:  Garwin Brothers, MD   Referring MD: Sharlene Dory*    ASSESSMENT:    1. Coronary artery disease involving native coronary artery of native heart without angina pectoris   2. Cardiomyopathy, unspecified type (HCC)   3. Essential hypertension   4. Diabetes mellitus due to underlying condition with unspecified complications (HCC)   5. Mixed dyslipidemia   6. Renal insufficiency   7. NEPHRECTOMY, HX OF    PLAN:    In order of problems listed above:  1. Coronary artery disease: Secondary prevention stressed with the patient.  Importance of compliance with diet medication stressed any vocalized understanding.  I told him to walk at least half an hour a day 5 days a week and he promises to do so. 2. Essential hypertension: Blood pressure stable and diet was emphasized 3. Cardiomyopathy: He has an appointment coming up with her congestive heart failure clinic.  In view of his solitary kidney, little hesitant on Entresto.  He is on [place].  His doctor has added Metformin to his regimen for diabetes.  He is going to check with his primary care physician but that it is okay to take the combination of these 2 medications.  He is also going to check with the pharmacist.  I would also like an opinion of congestive heart colics in view of the above findings. 4. Solitary kidney: Stable at this time. 5. Mixed dyslipidemia: Diet was emphasized.  Patient on statin therapy. 6. Patient will be seen in follow-up appointment in 6 months or earlier if the patient has any concerns    Medication Adjustments/Labs and Tests Ordered: Current medicines are reviewed at length with the patient today.  Concerns regarding medicines are outlined above.  No orders of the defined types were placed in this encounter.  No orders of the defined types were  placed in this encounter.    No chief complaint on file.    History of Present Illness:    Michael Guerra is a 59 y.o. male.  Patient has past medical history of coronary artery disease which is nonobstructive, cardiomyopathy essential hypertension mixed dyslipidemia and diabetes mellitus.  He denies any problems at this time and takes care of activities of daily living.  No chest pain orthopnea or PND.  He does not exercise on a regular basis.  He has been lax with his diet and his hemoglobin A1c has been around 8.5.  At the time of my evaluation, the patient is alert awake oriented and in no distress.  Past Medical History:  Diagnosis Date  . Blood transfusion without reported diagnosis   . CAD (coronary artery disease) 06/08/2019  . Cardiomyopathy (HCC) 05/23/2019  . Cataract   . Chest discomfort 05/12/2019  . Chronic kidney disease    one kidney that is healthy  . Chronic left shoulder pain 02/05/2020  . Diabetes mellitus due to underlying condition with unspecified complications (HCC) 05/12/2019  . Essential hypertension 05/12/2019  . Foot sprain, left, initial encounter 06/22/2019  . LIVER FUNCTION TESTS, ABNORMAL 05/21/2006   Qualifier: Diagnosis of  By: Maple Hudson MD, Riki Rusk    . Mixed dyslipidemia 05/12/2019  . Other specified abnormal findings of blood chemistry 05/20/2011   Formatting of this note might be different from the original. Elevated liver function tests 10/1 IMO update  . Rectal  polyp 05/20/2011   Formatting of this note might be different from the original. Noted march 2011 fu in march 2016  . Renal insufficiency 05/23/2019  . Skin lesion of chest wall 05/20/2011   Formatting of this note might be different from the original. Mid chest (prob seb/epidermoid cyst)  . Solitary kidney   . Type 2 diabetes mellitus with hyperglycemia, without long-term current use of insulin (HCC) 02/17/2006   Qualifier: Diagnosis of  By: Maple Hudson MD, Riki Rusk      Past Surgical History:  Procedure  Laterality Date  . ABDOMINAL SURGERY    . KIDNEY SURGERY     bowel obstruction  x 3  . LEFT HEART CATH AND CORONARY ANGIOGRAPHY N/A 05/31/2019   Procedure: LEFT HEART CATH AND CORONARY ANGIOGRAPHY;  Surgeon: Iran Ouch, MD;  Location: [facility];  Service: Cardiovascular;  Laterality: N/A;    Current Medications: Current Meds  Medication Sig  . ACCU-CHEK FASTCLIX LANCETS MISC Use as directed twice daily to check blood sugar.  Marland Kitchen ACCU-CHEK GUIDE test strip USE TO CHECK BLOOD SUGAR 2 TIMES DAILY  . acetaminophen (TYLENOL) 500 MG tablet Take 1,000 mg by mouth every 6 (six) hours as needed for moderate pain or headache.  Marland Kitchen aspirin 81 MG tablet Take 81 mg by mouth daily.    Marland Kitchen atorvastatin (LIPITOR) 40 MG tablet TAKE 1 TABLET (40 MG TOTAL) BY MOUTH DAILY.  Marland Kitchen B-D 3CC LUER-LOK SYR 18GX1-1/2 18G X 1-1/2" 3 ML MISC   . B-D 3CC LUER-LOK SYR 21GX1-1/2 21G X 1-1/2" 3 ML MISC USE WEEKLY FOR DRAWING  . BD DISP NEEDLES 22G X 1-1/2" MISC   . carvedilol (COREG) 3.125 MG tablet Take 1 tablet (3.125 mg total) by mouth 2 (two) times daily with a meal.  . COLLAGEN PO Take 1 Scoop by mouth daily. Collagen powder  . Continuous Blood Gluc Receiver (DEXCOM G6 RECEIVER) DEVI Use daily to check blood sugar  . Continuous Blood Gluc Sensor (DEXCOM G6 SENSOR) MISC Use daily to check blood sugar  . Continuous Blood Gluc Transmit (DEXCOM G6 TRANSMITTER) MISC Use daily to check blood sugar.  . Melatonin 10 MG CAPS Take 30 mg by mouth at bedtime as needed (sleep).  . metFORMIN (GLUCOPHAGE XR) 500 MG 24 hr tablet Take 2 tablets (1,000 mg total) by mouth in the morning and at bedtime.  . Multiple Vitamins-Minerals (MULTIVITAMIN ADULT) TABS Take 1 tablet by mouth daily with lunch.   . Multiple Vitamins-Minerals (ZINC PO) Take 1 capsule by mouth daily with lunch.  . Omega-3 Fatty Acids (FISH OIL) 1000 MG CAPS Take 1,000 mg by mouth daily with lunch.   . Probiotic CAPS Take 2 capsules by mouth daily.  Marland Kitchen  testosterone cypionate (DEPOTESTOTERONE CYPIONATE) 100 MG/ML injection Inject 80 mg into the muscle every Wednesday.   . triamcinolone cream (KENALOG) 0.1 % Apply 1 application topically 2 (two) times daily.     Allergies:   Lisinopril   Social History   Socioeconomic History  . Marital status: Married    Spouse name: Not on file  . Number of children: Not on file  . Years of education: Not on file  . Highest education level: Not on file  Occupational History  . Not on file  Tobacco Use  . Smoking status: Never Smoker  . Smokeless tobacco: Never Used  Substance and Sexual Activity  . Alcohol use: No  . Drug use: No  . Sexual activity: Not on file  Other Topics  Concern  . Not on file  Social History Narrative  . Not on file   Social Determinants of Health   Financial Resource Strain:   . Difficulty of Paying Living Expenses: Not on file  Food Insecurity:   . Worried About Programme researcher, broadcasting/film/video in the Last Year: Not on file  . Ran Out of Food in the Last Year: Not on file  Transportation Needs:   . Lack of Transportation (Medical): Not on file  . Lack of Transportation (Non-Medical): Not on file  Physical Activity:   . Days of Exercise per Week: Not on file  . Minutes of Exercise per Session: Not on file  Stress:   . Feeling of Stress : Not on file  Social Connections:   . Frequency of Communication with Friends and Family: Not on file  . Frequency of Social Gatherings with Friends and Family: Not on file  . Attends Religious Services: Not on file  . Active Member of Clubs or Organizations: Not on file  . Attends Banker Meetings: Not on file  . Marital Status: Not on file     Family History: The patient's family history is negative for Cancer, Colon cancer, Esophageal cancer, Rectal cancer, and Stomach cancer.  ROS:   Please see the history of present illness.    All other systems reviewed and are negative.  EKGs/Labs/Other Studies Reviewed:     The following studies were reviewed today: I discussed my findings with the patient at length.  Echocardiogram report was reviewed.   Recent Labs: 10/18/2019: ALT 39; BUN 16; Creatinine, Ser 1.51; Hemoglobin 16.8; Magnesium 2.0; Platelets 187; Potassium 4.6; Sodium 136; TSH 2.150  Recent Lipid Panel    Component Value Date/Time   CHOL 115 10/18/2019 0836   TRIG 92 10/18/2019 0836   HDL 37 (L) 10/18/2019 0836   CHOLHDL 3.1 10/18/2019 0836   CHOLHDL 3 10/16/2019 1414   VLDL 19.2 10/16/2019 1414   LDLCALC 60 10/18/2019 0836   LDLDIRECT 97.0 10/18/2017 0941    Physical Exam:    VS:  BP 130/80   Pulse 78   Ht 6\' 1"  (1.854 m)   Wt 214 lb (97.1 kg)   SpO2 96%   BMI 28.23 kg/m     Wt Readings from Last 3 Encounters:  02/21/20 214 lb (97.1 kg)  02/05/20 207 lb 8 oz (94.1 kg)  01/09/20 218 lb (98.9 kg)     GEN: Patient is in no acute distress HEENT: Normal NECK: No JVD; No carotid bruits LYMPHATICS: No lymphadenopathy CARDIAC: Hear sounds regular, 2/6 systolic murmur at the apex. RESPIRATORY:  Clear to auscultation without rales, wheezing or rhonchi  ABDOMEN: Soft, non-tender, non-distended MUSCULOSKELETAL:  No edema; No deformity  SKIN: Warm and dry NEUROLOGIC:  Alert and oriented x 3 PSYCHIATRIC:  Normal affect   Signed, 01/11/20, MD  02/21/2020 8:52 AM    [facility]

## 2020-02-23 MED FILL — CARVEDILOL 3.125 MG TABLET: 3.125 | 30 days supply | Qty: 60 | Fill #8

## 2020-03-07 ENCOUNTER — Other Ambulatory Visit: Payer: Self-pay

## 2020-03-07 ENCOUNTER — Encounter (HOSPITAL_COMMUNITY): Payer: Self-pay | Admitting: Cardiology

## 2020-03-07 ENCOUNTER — Ambulatory Visit (HOSPITAL_COMMUNITY)
Admission: RE | Admit: 2020-03-07 | Discharge: 2020-03-07 | Disposition: A | Discharge status: Home / Self Care | Payer: No Typology Code available for payment source | Source: Ambulatory Visit | Attending: Cardiology | Admitting: Cardiology

## 2020-03-07 ENCOUNTER — Other Ambulatory Visit (HOSPITAL_COMMUNITY): Payer: Self-pay | Admitting: Cardiology

## 2020-03-07 VITALS — BP 150/88 | HR 79 | Wt 217.0 lb

## 2020-03-07 DIAGNOSIS — I428 Other cardiomyopathies: Secondary | ICD-10-CM | POA: Insufficient documentation

## 2020-03-07 DIAGNOSIS — I2510 Atherosclerotic heart disease of native coronary artery without angina pectoris: Secondary | ICD-10-CM | POA: Insufficient documentation

## 2020-03-07 DIAGNOSIS — I480 Paroxysmal atrial fibrillation: Secondary | ICD-10-CM

## 2020-03-07 DIAGNOSIS — I130 Hypertensive heart and chronic kidney disease with heart failure and stage 1 through stage 4 chronic kidney disease, or unspecified chronic kidney disease: Secondary | ICD-10-CM | POA: Insufficient documentation

## 2020-03-07 DIAGNOSIS — Z7982 Long term (current) use of aspirin: Secondary | ICD-10-CM | POA: Diagnosis not present

## 2020-03-07 DIAGNOSIS — N1831 Chronic kidney disease, stage 3a: Secondary | ICD-10-CM | POA: Diagnosis not present

## 2020-03-07 DIAGNOSIS — E782 Mixed hyperlipidemia: Secondary | ICD-10-CM | POA: Insufficient documentation

## 2020-03-07 DIAGNOSIS — Z79899 Other long term (current) drug therapy: Secondary | ICD-10-CM | POA: Diagnosis not present

## 2020-03-07 DIAGNOSIS — Q600 Renal agenesis, unilateral: Secondary | ICD-10-CM | POA: Diagnosis not present

## 2020-03-07 DIAGNOSIS — I5022 Chronic systolic (congestive) heart failure: Secondary | ICD-10-CM | POA: Diagnosis present

## 2020-03-07 DIAGNOSIS — E1122 Type 2 diabetes mellitus with diabetic chronic kidney disease: Secondary | ICD-10-CM | POA: Diagnosis not present

## 2020-03-07 DIAGNOSIS — Z7984 Long term (current) use of oral hypoglycemic drugs: Secondary | ICD-10-CM | POA: Diagnosis not present

## 2020-03-07 DIAGNOSIS — Z905 Acquired absence of kidney: Secondary | ICD-10-CM | POA: Diagnosis not present

## 2020-03-07 HISTORY — DX: Heart failure, unspecified: I50.9

## 2020-03-07 LAB — BASIC METABOLIC PANEL
Anion gap: 12 (ref 5–15)
BUN: 15 mg/dL (ref 8–23)
CO2: 20 mmol/L — ABNORMAL LOW (ref 22–32)
Calcium: 9.9 mg/dL (ref 8.9–10.3)
Chloride: 105 mmol/L (ref 98–111)
Creatinine, Ser: 1.38 mg/dL — ABNORMAL HIGH (ref 0.61–1.24)
GFR, Estimated: 58 mL/min — ABNORMAL LOW (ref >60)
Glucose, Bld: 116 mg/dL — ABNORMAL HIGH (ref 70–99)
Potassium: 5.2 mmol/L — ABNORMAL HIGH (ref 3.5–5.1)
Sodium: 137 mmol/L (ref 135–145)

## 2020-03-07 MED ORDER — ENTRESTO 24-26 MG PO TABS
1.0000 | ORAL_TABLET | Freq: Two times a day (BID) | ORAL | 3 refills | Status: DC
Start: 2020-03-07 — End: 2020-06-11

## 2020-03-07 MED FILL — ENTRESTO 24 MG-26 MG TABLET: 24-26 | 90 days supply | Qty: 180 | Fill #0

## 2020-03-07 MED FILL — BD 3 ML SYRINGE 18GX1-1/2: 18G X 1-1/2 | 28 days supply | Qty: 4 | Fill #1

## 2020-03-07 NOTE — Patient Instructions (Signed)
START Entresto 24/26mg  (1 tablet) Twice daily  Labs done today, your results will be available in MyChart, we will contact you for abnormal readings.  Your physician has requested that you have a cardiac MRI. Cardiac MRI uses a computer to create images of your heart as its beating, producing both still and moving pictures of your heart and major blood vessels. For further information please visit [email or web address]. Please follow the instruction sheet given to you today for more information.  Your physician recommends that you schedule repeat labs in 10-14 days  Your physician recommends that you schedule a follow-up appointment in: 3 weeks with our pharmacists  Your physician recommends that you schedule a follow-up appointment in: 6 weeks   If you have any questions or concerns before your next appointment please send [place] a message through [employer] or call our office at [phone].    TO LEAVE A MESSAGE FOR THE NURSE SELECT OPTION 2, PLEASE LEAVE A MESSAGE INCLUDING: . YOUR NAME . DATE OF BIRTH . CALL BACK NUMBER . REASON FOR CALL**this is important as we prioritize the call backs  YOU WILL RECEIVE A CALL BACK THE SAME DAY AS LONG AS YOU CALL BEFORE 4:00 PM

## 2020-03-08 ENCOUNTER — Telehealth (HOSPITAL_COMMUNITY): Payer: Self-pay

## 2020-03-08 DIAGNOSIS — I5022 Chronic systolic (congestive) heart failure: Secondary | ICD-10-CM

## 2020-03-08 NOTE — Telephone Encounter (Signed)
-----   Message from Laurey Morale, MD sent at 03/07/2020 11:34 PM EST ----- Suspect elevated K is due to hemolysis.  Please have him repeat BMET next week.

## 2020-03-08 NOTE — Telephone Encounter (Signed)
Lab appt scheduled,lab order entered

## 2020-03-08 NOTE — Progress Notes (Signed)
PCP: Sharlene Dory, DO Cardiology: Dr. Tomie China HF Cardiology: Dr. Shirlee Latch  59 y.o. with history of CKD stage 3, HTN, and diabetes was referred for evaluation of CHF by Dr. Tomie China.  Patient has had diabetes and HTN for about 10-15 years per his report.  He has CKD stage 3 in setting of nephrectomy done in childhood due to trauma, follows with nephrology.  He developed atypical chest pain in 2/21 and had Cardiolite done.  This showed EF 23% with fixed inferior defect.  Echo showed EF 35-40%, mild LV dilation, no LVH, normal RV.  He then had LHC done, showing OM1 65% stenosis, distal LAD 70% stenosis.  Cardiomyopathy was thought to be out of proportion to CAD. No recent viral-type infection symptoms.  No strong family history of CHF.  BP remains elevated.   Symptomatically, patient has been doing well.  He works full time a Electrical engineer.  No ETOH or smoking.  No further chest pain.  He is generally very active with no exertional dyspnea. No orthopnea/PND.   ECG (1/21): NSR, poor RWP  Labs (6/21): LDL 60, K 4.6, creatinine 1.5  PMH: 1. CKD stage 3: H/o nephrectomy in childhood post-trauma.  2. HTN - Renal artery dopplers (9/21): No significant stenosis.  3. Hyperlipidemia 4. Type 2 diabetes 5. CAD: LHC (2/21) with OM1 65% stenosis, distal LAD 70% stenosis.  6. Chronic systolic CHF: Nonischemic cardiomyopathy, cardiomyopathy out of proportion to CAD.  - Echo (2/21) with EF 35-40%, mild LV dilation, no LVH, normal RV.   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   FH: Mother with diabetes, possible CHF.   ROS: All systems reviewed and negative except as per HPI.   Current Outpatient Medications  Medication Sig Dispense Refill  . ACCU-CHEK FASTCLIX LANCETS MISC Use as directed twice daily to check blood sugar. 100 each 3  . ACCU-CHEK GUIDE test strip USE TO CHECK BLOOD SUGAR 2 TIMES DAILY 200 strip 1  . acetaminophen (TYLENOL) 500 MG  tablet Take 1,000 mg by mouth every 6 (six) hours as needed for moderate pain or headache.    Marland Kitchen amLODipine (NORVASC) 5 MG tablet Take 1 tablet (5 mg total) by mouth daily. 180 tablet 3  . aspirin 81 MG tablet Take 81 mg by mouth daily.      Marland Kitchen atorvastatin (LIPITOR) 40 MG tablet TAKE 1 TABLET (40 MG TOTAL) BY MOUTH DAILY. 90 tablet 3  . B-D 3CC LUER-LOK SYR 18GX1-1/2 18G X 1-1/2" 3 ML MISC     . B-D 3CC LUER-LOK SYR 21GX1-1/2 21G X 1-1/2" 3 ML MISC USE WEEKLY FOR DRAWING    . BD DISP NEEDLES 22G X 1-1/2" MISC     . carvedilol (COREG) 3.125 MG tablet Take 1 tablet (3.125 mg total) by mouth 2 (two) times daily with a meal. 60 tablet 12  . Continuous Blood Gluc Receiver (DEXCOM G6 RECEIVER) DEVI Use daily to check blood sugar 1 each 0  . Continuous Blood Gluc Sensor (DEXCOM G6 SENSOR) MISC Use daily to check blood sugar 3 each 0  . Continuous Blood Gluc Transmit (DEXCOM G6 TRANSMITTER) MISC Use daily to check blood sugar. 1 each 0  . Melatonin 10 MG CAPS Take 30 mg by mouth at bedtime as needed (sleep).    . metFORMIN (GLUCOPHAGE XR) 500 MG 24 hr tablet Take 2 tablets (1,000 mg total) by mouth in the morning and at bedtime. 360 tablet 2  .  Multiple Vitamins-Minerals (MULTIVITAMIN ADULT) TABS Take 1 tablet by mouth daily with lunch.     . Multiple Vitamins-Minerals (ZINC PO) Take 1 capsule by mouth daily with lunch.    . Omega-3 Fatty Acids (FISH OIL) 1000 MG CAPS Take 1,000 mg by mouth daily with lunch.     . Probiotic CAPS Take 2 capsules by mouth daily.    Marland Kitchen testosterone cypionate (DEPOTESTOTERONE CYPIONATE) 100 MG/ML injection Inject 80 mg into the muscle every Wednesday.     . triamcinolone cream (KENALOG) 0.1 % Apply 1 application topically 2 (two) times daily. 30 g 0  . sacubitril-valsartan (ENTRESTO) 24-26 MG Take 1 tablet by mouth 2 (two) times daily. 180 tablet 3   No current facility-administered medications for this encounter.   BP (!) 150/88   Pulse 79   Wt 98.4 kg (217 lb)   SpO2  97%   BMI 28.63 kg/m  General: NAD Neck: No JVD, no thyromegaly or thyroid nodule.  Lungs: Clear to auscultation bilaterally with normal respiratory effort. CV: Nondisplaced PMI.  Heart regular S1/S2, no S3/S4, no murmur.  No peripheral edema.  No carotid bruit.  Normal pedal pulses.  Abdomen: Soft, nontender, no hepatosplenomegaly, no distention.  Skin: Intact without lesions or rashes.  Neurologic: Alert and oriented x 3.  Psych: Normal affect. Extremities: No clubbing or cyanosis.  HEENT: Normal.   Assessment/Plan: 1. Chronic systolic CHF: Echo in 2/21 with EF 35-40%.  LHC in 2/21 with moderate coronary disease, but coronary disease appears out of proportion to the severity of the cardiomyopathy.  It is possible that long-standing HTN and DM have triggered the cardiomyopathy.  Cannot rule out infiltrative disease or myocarditis either. - Start Entresto 24/26 bid, BMET today and in 10 days (follow carefully with solitary functional kidney).  - Continue Coreg 3.125 mg bid.  - Next steps will be spironolactone and SGLT2 inhibitor.  - Can stop amlodipine in the future if needed to allow room for GDMT.  - Cardiac MRI to assess for infiltrative disease and myocarditis.  - Iron studies.  2. CAD: Moderate disease, no intervention on 2/21 cath. No chest pain.  - Continue ASA 81 daily.  - Continue atorvastatin, good lipids in 6/21.  3. HTN: BP elevated. Starting [facility] as above.  4. CKD stage 3a: Solitary kidney.   Follow carefully.   Followup with HF pharmacist in 3 wks for med titration.  See me in 6 wks.   Marca Ancona 03/08/2020

## 2020-03-11 ENCOUNTER — Ambulatory Visit (HOSPITAL_COMMUNITY)
Admission: RE | Admit: 2020-03-11 | Discharge: 2020-03-11 | Disposition: A | Discharge status: Home / Self Care | Payer: No Typology Code available for payment source | Source: Ambulatory Visit | Attending: Internal Medicine | Admitting: Internal Medicine

## 2020-03-11 ENCOUNTER — Other Ambulatory Visit: Payer: Self-pay

## 2020-03-11 DIAGNOSIS — I5022 Chronic systolic (congestive) heart failure: Secondary | ICD-10-CM | POA: Diagnosis present

## 2020-03-11 LAB — BASIC METABOLIC PANEL
Anion gap: 15 (ref 5–15)
BUN: 17 mg/dL (ref 8–23)
CO2: 22 mmol/L (ref 22–32)
Calcium: 10.3 mg/dL (ref 8.9–10.3)
Chloride: 102 mmol/L (ref 98–111)
Creatinine, Ser: 1.52 mg/dL — ABNORMAL HIGH (ref 0.61–1.24)
GFR, Estimated: 52 mL/min — ABNORMAL LOW (ref >60)
Glucose, Bld: 131 mg/dL — ABNORMAL HIGH (ref 70–99)
Potassium: 4.4 mmol/L (ref 3.5–5.1)
Sodium: 139 mmol/L (ref 135–145)

## 2020-03-15 MED FILL — BD NEEDLES 22GX1.5: 22G X 1-1/2 | 28 days supply | Qty: 4 | Fill #1

## 2020-03-18 ENCOUNTER — Other Ambulatory Visit: Payer: Self-pay

## 2020-03-18 ENCOUNTER — Ambulatory Visit (HOSPITAL_COMMUNITY)
Admission: RE | Admit: 2020-03-18 | Discharge: 2020-03-18 | Disposition: A | Discharge status: Home / Self Care | Payer: No Typology Code available for payment source | Source: Ambulatory Visit | Attending: Cardiology | Admitting: Cardiology

## 2020-03-18 DIAGNOSIS — I5022 Chronic systolic (congestive) heart failure: Secondary | ICD-10-CM | POA: Insufficient documentation

## 2020-03-18 LAB — BASIC METABOLIC PANEL
Anion gap: 11 (ref 5–15)
BUN: 19 mg/dL (ref 8–23)
CO2: 23 mmol/L (ref 22–32)
Calcium: 9.5 mg/dL (ref 8.9–10.3)
Chloride: 103 mmol/L (ref 98–111)
Creatinine, Ser: 1.34 mg/dL — ABNORMAL HIGH (ref 0.61–1.24)
GFR, Estimated: >60 mL/min (ref >60)
Glucose, Bld: 130 mg/dL — ABNORMAL HIGH (ref 70–99)
Potassium: 4.1 mmol/L (ref 3.5–5.1)
Sodium: 137 mmol/L (ref 135–145)

## 2020-03-18 LAB — HM DIABETES EYE EXAM

## 2020-03-21 NOTE — Progress Notes (Signed)
PCP: Sharlene Dory, DO Cardiology: Dr. Tomie China HF Cardiology: Dr. Shirlee Latch  HPI:  59 y.o. with history of CKD stage 3, HTN, and diabetes was referred for evaluation of CHF by Dr. Tomie China.  Patient has had diabetes and HTN for about 10-15 years per his report.  He has CKD stage 3 in setting of nephrectomy done in childhood due to trauma, follows with nephrology.  He developed atypical chest pain in 2/21 and had Cardiolite done.  This showed EF 23% with fixed inferior defect.  Echo showed EF 35-40%, mild LV dilation, no LVH, normal RV.  He then had LHC done, showing OM1 65% stenosis, distal LAD 70% stenosis.  Cardiomyopathy was thought to be out of proportion to CAD. No recent viral-type infection symptoms.  No strong family history of CHF.  BP remains elevated.   Recently returned to HF Clinic on 03/07/20. Symptomatically, patient had been doing well.  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  No ETOH or smoking.  No further chest pain.  He was generally very active with no exertional dyspnea. No orthopnea/PND.   Today he returns to HF clinic for pharmacist medication titration. At last visit with MD Entresto 24/26 mg BID was initiated. Overall he is feeling well today. Only complained of dizziness once after his MRI, otherwise no issues. No chest pain or palpitations. No SOB/DOE. Weight has been stable at home. He is not taking any diuretic. No LEE, PND or orthopnea. Taking all medications as prescribed.     HF Medications: Carvedilol 3.125 mg BID Entresto 24/26 mg BID   Has the patient been experiencing any side effects to the medications prescribed?  no  Does the patient have any problems obtaining medications due to transportation or finances?   No - has [employer] commercial plan  Understanding of regimen: good Understanding of indications: good Potential of compliance: good Patient understands to avoid NSAIDs. Patient understands to avoid decongestants.     Pertinent Lab Values (03/18/20): Marland Kitchen Serum creatinine 1.34, BUN 19, Potassium 4.1, Sodium 137  Vital Signs: . Weight: 214 lbs (last clinic weight: 217 lbs) . Blood pressure: 130/84  . Heart rate: 79   Assessment: 1. Chronic systolic CHF: Echo in 2/21 with EF 35-40%.  LHC in 2/21 with moderate coronary disease, but coronary disease appears out of proportion to the severity of the cardiomyopathy.  It is possible that long-standing HTN and DM have triggered the cardiomyopathy.  Cannot rule out infiltrative disease or myocarditis either. - NYHA class II symptoms, euvolemic on exam - Not taking any diuretics - Continue carvedilol 3.125 mg BID.  - Continue Entresto 24/26 mg BID; (follow carefully with solitary functional kidney).  - Start spironolactone 12.5 mg daily. Repeat BMET in 1 week.  - Continue Farxiga 10 mg daily (started by PCP) - Can stop amlodipine in the future if needed to allow room for GDMT.  - Cardiac MRI to assess for infiltrative disease and myocarditis.  2. CAD: Moderate disease, no intervention on 2/21 cath. No chest pain.  - Continue ASA 81 daily.  - Continue atorvastatin, good lipids in 6/21.  3. HTN: BP improved.  - Increase spironolactone as above. Continue carvedilol, amlodipine and Entresto. 4. CKD stage 3a: Solitary kidney.   Follow carefully.     Plan: 1) Medication changes: Based on clinical presentation, vital signs and recent labs will start spironolactone 12.5 mg daily.  2) Follow-up: 1 month with Dr. Jonn Shingles, PharmD, BCPS, BCCP, CPP  Heart Failure Clinic Pharmacist [phone]

## 2020-03-29 MED FILL — CARVEDILOL 3.125 MG TABLET: 3.125 | 30 days supply | Qty: 60 | Fill #9

## 2020-04-01 ENCOUNTER — Telehealth (HOSPITAL_COMMUNITY): Payer: Self-pay | Admitting: Emergency Medicine

## 2020-04-01 NOTE — Telephone Encounter (Signed)
Reaching out to patient to offer assistance regarding upcoming cardiac imaging study; pt verbalizes understanding of appt date/time, parking situation and where to check in, and verified current allergies; name and call back number provided for further questions should they arise Michael Kamphuis RN Navigator Cardiac Imaging  Heart and Vascular [phone] office [phone] cell  Pt denies implants, denies claustro Michael Guerra 

## 2020-04-01 NOTE — Telephone Encounter (Signed)
error 

## 2020-04-02 ENCOUNTER — Other Ambulatory Visit: Payer: Self-pay

## 2020-04-02 ENCOUNTER — Ambulatory Visit (HOSPITAL_COMMUNITY)
Admission: RE | Admit: 2020-04-02 | Discharge: 2020-04-02 | Disposition: A | Discharge status: Home / Self Care | Payer: No Typology Code available for payment source | Source: Ambulatory Visit | Attending: Cardiology | Admitting: Cardiology

## 2020-04-02 DIAGNOSIS — I428 Other cardiomyopathies: Secondary | ICD-10-CM | POA: Insufficient documentation

## 2020-04-02 MED ORDER — GADOBUTROL 1 MMOL/ML IV SOLN
10.0000 mL | Freq: Once | INTRAVENOUS | Status: AC | PRN
  Administered 2020-04-02: 10 mL via INTRAVENOUS

## 2020-04-03 ENCOUNTER — Encounter: Payer: Self-pay | Admitting: Family Medicine

## 2020-04-03 ENCOUNTER — Ambulatory Visit (HOSPITAL_COMMUNITY)
Admission: RE | Admit: 2020-04-03 | Discharge: 2020-04-03 | Disposition: A | Discharge status: Home / Self Care | Payer: No Typology Code available for payment source | Source: Ambulatory Visit | Attending: Internal Medicine | Admitting: Internal Medicine

## 2020-04-03 VITALS — BP 130/84 | HR 79 | Wt 214.0 lb

## 2020-04-03 DIAGNOSIS — I428 Other cardiomyopathies: Secondary | ICD-10-CM | POA: Diagnosis not present

## 2020-04-03 DIAGNOSIS — E782 Mixed hyperlipidemia: Secondary | ICD-10-CM | POA: Diagnosis not present

## 2020-04-03 DIAGNOSIS — I2510 Atherosclerotic heart disease of native coronary artery without angina pectoris: Secondary | ICD-10-CM | POA: Insufficient documentation

## 2020-04-03 DIAGNOSIS — E1165 Type 2 diabetes mellitus with hyperglycemia: Secondary | ICD-10-CM | POA: Diagnosis not present

## 2020-04-03 DIAGNOSIS — I5022 Chronic systolic (congestive) heart failure: Secondary | ICD-10-CM

## 2020-04-03 DIAGNOSIS — I10 Essential (primary) hypertension: Secondary | ICD-10-CM | POA: Diagnosis not present

## 2020-04-03 MED ORDER — SPIRONOLACTONE 25 MG PO TABS: 12.5000 mg | ORAL_TABLET | Freq: Every day | ORAL | 3 refills | Status: DC

## 2020-04-03 MED FILL — SPIRONOLACTONE 25 MG TABS: 25 | 90 days supply | Qty: 45 | Fill #0

## 2020-04-03 NOTE — Patient Instructions (Addendum)
It was a pleasure seeing you today!  MEDICATIONS: -We are changing your medications today -Start spironolactone 12.5 mg (1/2 tablet) daily -Call if you have questions about your medications.   NEXT APPOINTMENT: Return to clinic in 1 month with Dr. McLean.  In general, to take care of your heart failure: -Limit your fluid intake to 2 Liters (half-gallon) per day.   -Limit your salt intake to ideally 2-3 grams (2000-3000 mg) per day. -Weigh yourself daily and record, and bring that "weight diary" to your next appointment.  (Weight gain of 2-3 pounds in 1 day typically means fluid weight.) -The medications for your heart are to help your heart and help you live longer.   -Please contact us before stopping any of your heart medications.  Call the clinic at [phone] with questions or to reschedule future appointments.  

## 2020-04-04 MED FILL — BD NEEDLES 22GX1.5: 22G X 1-1/2 | 28 days supply | Qty: 4 | Fill #2

## 2020-04-04 MED FILL — BD 3 ML SYRINGE 18GX1-1/2: 18G X 1-1/2 | 28 days supply | Qty: 4 | Fill #2

## 2020-04-05 ENCOUNTER — Telehealth (HOSPITAL_COMMUNITY): Payer: Self-pay

## 2020-04-05 NOTE — Telephone Encounter (Signed)
Samara Snide, RN  04/05/2020 2:49 PM EST Back to Top     Patient advised and verbalized understanding.

## 2020-04-05 NOTE — Telephone Encounter (Signed)
-----   Message from Laurey Morale, MD sent at 04/03/2020 11:39 AM EST ----- Low LV EF at 15%, RV only mildly abnormal.  Need to continue aggressive medication titration and will need consideration for ICD.  Make sure he has followup soon.

## 2020-04-10 ENCOUNTER — Ambulatory Visit (HOSPITAL_COMMUNITY)
Admission: RE | Admit: 2020-04-10 | Discharge: 2020-04-10 | Disposition: A | Discharge status: Home / Self Care | Payer: No Typology Code available for payment source | Source: Ambulatory Visit | Attending: Internal Medicine | Admitting: Internal Medicine

## 2020-04-10 ENCOUNTER — Other Ambulatory Visit: Payer: Self-pay

## 2020-04-10 DIAGNOSIS — IMO0002 Reserved for concepts with insufficient information to code with codable children: Secondary | ICD-10-CM

## 2020-04-10 DIAGNOSIS — N289 Disorder of kidney and ureter, unspecified: Secondary | ICD-10-CM | POA: Insufficient documentation

## 2020-04-10 DIAGNOSIS — Q600 Renal agenesis, unilateral: Secondary | ICD-10-CM | POA: Diagnosis not present

## 2020-04-10 LAB — BASIC METABOLIC PANEL
Anion gap: 9 (ref 5–15)
BUN: 17 mg/dL (ref 8–23)
CO2: 22 mmol/L (ref 22–32)
Calcium: 9.4 mg/dL (ref 8.9–10.3)
Chloride: 106 mmol/L (ref 98–111)
Creatinine, Ser: 1.43 mg/dL — ABNORMAL HIGH (ref 0.61–1.24)
GFR, Estimated: 56 mL/min — ABNORMAL LOW (ref >60)
Glucose, Bld: 223 mg/dL — ABNORMAL HIGH (ref 70–99)
Potassium: 5.4 mmol/L — ABNORMAL HIGH (ref 3.5–5.1)
Sodium: 137 mmol/L (ref 135–145)

## 2020-04-11 ENCOUNTER — Telehealth: Payer: Self-pay

## 2020-04-11 DIAGNOSIS — E875 Hyperkalemia: Secondary | ICD-10-CM

## 2020-04-11 NOTE — Telephone Encounter (Signed)
Left a message to return my call.

## 2020-04-11 NOTE — Addendum Note (Signed)
Addended by: Eleonore Chiquito on: 04/11/2020 11:39 AM   Modules accepted: Orders

## 2020-04-11 NOTE — Telephone Encounter (Signed)
VM left and will send in MyChart.

## 2020-04-11 NOTE — Telephone Encounter (Signed)
Pt is returning call.  

## 2020-04-11 NOTE — Telephone Encounter (Signed)
-----   Message from Brian J Munley, MD sent at 04/10/2020  4:16 PM EST ----- His potassium is elevated  I think he should stop taking spironolactone so that he can remain on Entresto.  We will recheck his BMP in about 2 weeks do not take any over-the-counter potassium supplements. 

## 2020-04-15 ENCOUNTER — Telehealth: Payer: Self-pay

## 2020-04-15 DIAGNOSIS — I10 Essential (primary) hypertension: Secondary | ICD-10-CM

## 2020-04-15 NOTE — Telephone Encounter (Signed)
-----   Message from Baldo Daub, MD sent at 04/10/2020  4:16 PM EST ----- His potassium is elevated  I think he should stop taking spironolactone so that he can remain on Entresto.  We will recheck his BMP in about 2 weeks do not take any over-the-counter potassium supplements.

## 2020-04-15 NOTE — Telephone Encounter (Signed)
Spoke with patient regarding results and recommendation.  Patient verbalizes understanding and is agreeable to plan of care. Advised patient to call back with any issues or concerns.  

## 2020-04-16 ENCOUNTER — Ambulatory Visit (INDEPENDENT_AMBULATORY_CARE_PROVIDER_SITE_OTHER): Payer: No Typology Code available for payment source | Admitting: Family Medicine

## 2020-04-16 ENCOUNTER — Encounter: Payer: Self-pay | Admitting: Family Medicine

## 2020-04-16 ENCOUNTER — Other Ambulatory Visit: Payer: Self-pay

## 2020-04-16 VITALS — BP 120/78 | HR 68 | Temp 98.5°F | Ht 73.0 in | Wt 214.5 lb

## 2020-04-16 DIAGNOSIS — E1165 Type 2 diabetes mellitus with hyperglycemia: Secondary | ICD-10-CM

## 2020-04-16 NOTE — Progress Notes (Signed)
Subjective:   Chief Complaint  Patient presents with  . Follow-up    Michael Guerra is a 59 y.o. male here for follow-up of diabetes.   Michael Guerra's self monitored glucose range is 160's.  Patient denies hypoglycemic reactions. He checks his glucose levels 1-2 time(s) per day. Patient does not require insulin.   Medications include: Farxiga 10 mg/d, Metformin XR 1000 mg bid.  Diet is OK, rough around the holidays.  Exercise: yoga, walking  Past Medical History:  Diagnosis Date  . CAD (coronary artery disease) 06/08/2019  . Cardiomyopathy (HCC) 05/23/2019  . Cataract   . Chest discomfort 05/12/2019  . CHF (congestive heart failure) (HCC)   . Chronic kidney disease    one kidney that is healthy  . Chronic left shoulder pain 02/05/2020  . Diabetes mellitus due to underlying condition with unspecified complications (HCC) 05/12/2019  . Essential hypertension 05/12/2019  . Foot sprain, left, initial encounter 06/22/2019  . LIVER FUNCTION TESTS, ABNORMAL 05/21/2006   Qualifier: Diagnosis of  By: Maple Hudson MD, Riki Rusk    . Mixed dyslipidemia 05/12/2019  . Other specified abnormal findings of blood chemistry 05/20/2011   Formatting of this note might be different from the original. Elevated liver function tests 10/1 IMO update  . Rectal polyp 05/20/2011   Formatting of this note might be different from the original. Noted march 2011 fu in march 2016  . Renal insufficiency 05/23/2019  . Skin lesion of chest wall 05/20/2011   Formatting of this note might be different from the original. Mid chest (prob seb/epidermoid cyst)  . Solitary kidney   . Type 2 diabetes mellitus with hyperglycemia, without long-term current use of insulin (HCC) 02/17/2006   Qualifier: Diagnosis of  By: Maple Hudson MD, Riki Rusk       Related testing: Retinal exam: Done Pneumovax: done  Objective:  BP 120/78 (BP Location: Right Arm, Patient Position: Sitting, Cuff Size: Normal)   Pulse 68   Temp 98.5 F (36.9 C) (Oral)   Ht 6\' 1"  (1.854  m)   Wt 214 lb 8 oz (97.3 kg)   SpO2 95%   BMI 28.30 kg/m  General:  Well developed, well nourished, in no apparent distress Skin:  Warm, no pallor or diaphoresis Lungs:  CTAB, no access msc use Cardio:  RRR, no bruits, no LE edema Psych: Age appropriate judgment and insight  Assessment:   Type 2 diabetes mellitus with hyperglycemia, without long-term current use of insulin (HCC) - Plan: Hemoglobin A1c   Plan:   Status: Chronic, uncontrolled.  Sugars are improved but I do not think he is controlled.  I did offer a new medication (GLP-1 or DPP 4 inhibitor) but he politely declined.  Counseled on diet and exercise.  Continue monitoring sugars.  We will recheck in 1 month's labs and make decisions based off of that reading. The patient voiced understanding and agreement to the plan.  [place], DO 04/16/20 9:50 AM

## 2020-04-16 NOTE — Patient Instructions (Addendum)
Keep the diet clean and stay active.  Continue to keep a log of your sugars.  Continue the [place] and metformin.   Let us know if you need anything.

## 2020-04-24 ENCOUNTER — Other Ambulatory Visit (HOSPITAL_COMMUNITY): Payer: Self-pay | Admitting: *Deleted

## 2020-04-24 ENCOUNTER — Ambulatory Visit (HOSPITAL_COMMUNITY)
Admission: RE | Admit: 2020-04-24 | Discharge: 2020-04-24 | Disposition: A | Discharge status: Home / Self Care | Payer: No Typology Code available for payment source | Source: Ambulatory Visit | Attending: Internal Medicine | Admitting: Internal Medicine

## 2020-04-24 ENCOUNTER — Other Ambulatory Visit: Payer: Self-pay

## 2020-04-24 DIAGNOSIS — I5022 Chronic systolic (congestive) heart failure: Secondary | ICD-10-CM | POA: Diagnosis not present

## 2020-04-24 LAB — BASIC METABOLIC PANEL
Anion gap: 11 (ref 5–15)
BUN: 22 mg/dL (ref 8–23)
CO2: 21 mmol/L — ABNORMAL LOW (ref 22–32)
Calcium: 9.5 mg/dL (ref 8.9–10.3)
Chloride: 102 mmol/L (ref 98–111)
Creatinine, Ser: 1.46 mg/dL — ABNORMAL HIGH (ref 0.61–1.24)
GFR, Estimated: 54 mL/min — ABNORMAL LOW (ref >60)
Glucose, Bld: 100 mg/dL — ABNORMAL HIGH (ref 70–99)
Potassium: 4.5 mmol/L (ref 3.5–5.1)
Sodium: 134 mmol/L — ABNORMAL LOW (ref 135–145)

## 2020-04-29 ENCOUNTER — Other Ambulatory Visit: Payer: Self-pay | Admitting: Family Medicine

## 2020-04-29 DIAGNOSIS — E119 Type 2 diabetes mellitus without complications: Secondary | ICD-10-CM

## 2020-04-29 DIAGNOSIS — E785 Hyperlipidemia, unspecified: Secondary | ICD-10-CM

## 2020-04-29 MED FILL — CARVEDILOL 3.125 MG TABLET: 3.125 | 30 days supply | Qty: 60 | Fill #10

## 2020-04-29 MED FILL — FARXIGA 10 MG TABLET: 10 | 90 days supply | Qty: 90 | Fill #3

## 2020-04-29 MED FILL — ATORVASTATIN 40 MG TABLET: 40 | 90 days supply | Qty: 90 | Fill #0

## 2020-05-15 ENCOUNTER — Other Ambulatory Visit (INDEPENDENT_AMBULATORY_CARE_PROVIDER_SITE_OTHER): Payer: No Typology Code available for payment source

## 2020-05-15 ENCOUNTER — Other Ambulatory Visit: Payer: Self-pay | Admitting: Family Medicine

## 2020-05-15 ENCOUNTER — Other Ambulatory Visit: Payer: Self-pay

## 2020-05-15 DIAGNOSIS — E1165 Type 2 diabetes mellitus with hyperglycemia: Secondary | ICD-10-CM | POA: Diagnosis not present

## 2020-05-15 LAB — HEMOGLOBIN A1C: Hgb A1c MFr Bld: 7.6 % — ABNORMAL HIGH (ref 4.6–6.5)

## 2020-05-15 MED ORDER — SITAGLIPTIN PHOSPHATE 100 MG PO TABS: 100.0000 mg | ORAL_TABLET | Freq: Every day | ORAL | 2 refills | Status: DC

## 2020-05-17 MED FILL — JANUVIA 100 MG TABLET: 100 | 30 days supply | Qty: 30 | Fill #0 | Status: TO

## 2020-05-17 MED FILL — JANUVIA 100 MG TABLET: 100 | 30 days supply | Qty: 30 | Fill #0

## 2020-05-20 ENCOUNTER — Ambulatory Visit (HOSPITAL_COMMUNITY)
Admission: RE | Admit: 2020-05-20 | Discharge: 2020-05-20 | Disposition: A | Discharge status: Home / Self Care | Payer: No Typology Code available for payment source | Source: Ambulatory Visit | Attending: Cardiology | Admitting: Cardiology

## 2020-05-20 ENCOUNTER — Other Ambulatory Visit: Payer: Self-pay

## 2020-05-20 ENCOUNTER — Other Ambulatory Visit (HOSPITAL_COMMUNITY): Payer: Self-pay | Admitting: Cardiology

## 2020-05-20 ENCOUNTER — Encounter (HOSPITAL_COMMUNITY): Payer: Self-pay | Admitting: Cardiology

## 2020-05-20 VITALS — BP 140/80 | HR 72 | Wt 213.8 lb

## 2020-05-20 DIAGNOSIS — E1122 Type 2 diabetes mellitus with diabetic chronic kidney disease: Secondary | ICD-10-CM | POA: Insufficient documentation

## 2020-05-20 DIAGNOSIS — Z905 Acquired absence of kidney: Secondary | ICD-10-CM | POA: Diagnosis not present

## 2020-05-20 DIAGNOSIS — Z833 Family history of diabetes mellitus: Secondary | ICD-10-CM | POA: Insufficient documentation

## 2020-05-20 DIAGNOSIS — I130 Hypertensive heart and chronic kidney disease with heart failure and stage 1 through stage 4 chronic kidney disease, or unspecified chronic kidney disease: Secondary | ICD-10-CM | POA: Insufficient documentation

## 2020-05-20 DIAGNOSIS — Z79899 Other long term (current) drug therapy: Secondary | ICD-10-CM | POA: Diagnosis not present

## 2020-05-20 DIAGNOSIS — I5022 Chronic systolic (congestive) heart failure: Secondary | ICD-10-CM | POA: Diagnosis not present

## 2020-05-20 DIAGNOSIS — Z7984 Long term (current) use of oral hypoglycemic drugs: Secondary | ICD-10-CM | POA: Insufficient documentation

## 2020-05-20 DIAGNOSIS — Z79890 Hormone replacement therapy (postmenopausal): Secondary | ICD-10-CM | POA: Insufficient documentation

## 2020-05-20 DIAGNOSIS — Z7982 Long term (current) use of aspirin: Secondary | ICD-10-CM | POA: Insufficient documentation

## 2020-05-20 DIAGNOSIS — N1831 Chronic kidney disease, stage 3a: Secondary | ICD-10-CM | POA: Insufficient documentation

## 2020-05-20 DIAGNOSIS — I429 Cardiomyopathy, unspecified: Secondary | ICD-10-CM

## 2020-05-20 DIAGNOSIS — I2510 Atherosclerotic heart disease of native coronary artery without angina pectoris: Secondary | ICD-10-CM | POA: Insufficient documentation

## 2020-05-20 LAB — BASIC METABOLIC PANEL
Anion gap: 12 (ref 5–15)
BUN: 20 mg/dL (ref 8–23)
CO2: 23 mmol/L (ref 22–32)
Calcium: 10.1 mg/dL (ref 8.9–10.3)
Chloride: 102 mmol/L (ref 98–111)
Creatinine, Ser: 1.41 mg/dL — ABNORMAL HIGH (ref 0.61–1.24)
GFR, Estimated: 56 mL/min — ABNORMAL LOW (ref >60)
Glucose, Bld: 78 mg/dL (ref 70–99)
Potassium: 4.3 mmol/L (ref 3.5–5.1)
Sodium: 137 mmol/L (ref 135–145)

## 2020-05-20 MED ORDER — SPIRONOLACTONE 25 MG PO TABS: 12.5000 mg | ORAL_TABLET | Freq: Every day | ORAL | 3 refills | Status: DC

## 2020-05-20 MED ORDER — AMLODIPINE BESYLATE 2.5 MG PO TABS
2.5000 mg | ORAL_TABLET | Freq: Every day | ORAL | 3 refills | Status: DC
Start: 2020-05-20 — End: 2020-12-19

## 2020-05-20 MED ORDER — CARVEDILOL 12.5 MG PO TABS
12.5000 mg | ORAL_TABLET | Freq: Two times a day (BID) | ORAL | 3 refills | Status: DC
Start: 2020-05-20 — End: 2020-05-20

## 2020-05-20 MED FILL — AMLODIPINE 2.5 MG TABLET: 2.5 | 90 days supply | Qty: 90 | Fill #0

## 2020-05-20 MED FILL — CARVEDILOL 12.5 MG TABLET: 12.5 | 90 days supply | Qty: 180 | Fill #0 | Status: TO

## 2020-05-20 MED FILL — CARVEDILOL 12.5 MG TABLET: 12.5 | 90 days supply | Qty: 180 | Fill #0

## 2020-05-20 MED FILL — AMLODIPINE 2.5 MG TABLET: 2.5 | 90 days supply | Qty: 90 | Fill #0 | Status: TO

## 2020-05-20 NOTE — Progress Notes (Signed)
PCP: Sharlene Dory, DO Cardiology: Dr. Tomie China HF Cardiology: Dr. Shirlee Latch  60 y.o. with history of CKD stage 3, HTN, and diabetes was referred for evaluation of CHF by Dr. Tomie China.  Patient has had diabetes and HTN for about 10-15 years per his report.  He has CKD stage 3 in setting of nephrectomy done in childhood due to trauma, follows with nephrology.  He developed atypical chest pain in 2/21 and had Cardiolite done.  This showed EF 23% with fixed inferior defect.  Echo showed EF 35-40%, mild LV dilation, no LVH, normal RV.  He then had LHC done, showing OM1 65% stenosis, distal LAD 70% stenosis.  Cardiomyopathy was thought to be out of proportion to CAD. No recent viral-type infection symptoms.  No strong family history of CHF.    Cardiac MRI in 12/21 was a difficult study, EF 15% with diffuse hypokinesis, mildly decreased RV systolic function, no LGE.    He had mildly elevated K on a prior BMET and spironolactone was stopped.   He returns for followup of CHF. He has been doing well.  No exertional dyspnea or chest pain.  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  No orthopnea/PND.  No lightheadedness or palpitations.    Labs (6/21): LDL 60, K 4.6, creatinine 1.5 Labs (1/22): K 4.5, creatinine 1.46  PMH: 1. CKD stage 3: H/o nephrectomy in childhood post-trauma.  2. HTN - Renal artery dopplers (9/21): No significant stenosis.  3. Hyperlipidemia 4. Type 2 diabetes 5. CAD: LHC (2/21) with OM1 65% stenosis, distal LAD 70% stenosis.  6. Chronic systolic CHF: Nonischemic cardiomyopathy, cardiomyopathy out of proportion to CAD.  - Echo (2/21) with EF 35-40%, mild LV dilation, no LVH, normal RV.  - Cardiac MRI (12/21): Difficult study, EF 15% with diffuse hypokinesis, mildly decreased RV systolic function, no LGE.    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   FH: Mother with diabetes, possible CHF.    ROS: All systems reviewed and negative except as per HPI.   Current Outpatient Medications  Medication Sig Dispense Refill  . ACCU-CHEK FASTCLIX LANCETS MISC Use as directed twice daily to check blood sugar. 100 each 3  . ACCU-CHEK GUIDE test strip USE TO CHECK BLOOD SUGAR 2 TIMES DAILY 200 strip 1  . acetaminophen (TYLENOL) 500 MG tablet Take 1,000 mg by mouth every 6 (six) hours as needed for moderate pain or headache.    Marland Kitchen aspirin 81 MG tablet Take 81 mg by mouth daily.    Marland Kitchen atorvastatin (LIPITOR) 40 MG tablet TAKE 1 TABLET (40 MG TOTAL) BY MOUTH DAILY. 90 tablet 3  . B-D 3CC LUER-LOK SYR 18GX1-1/2 18G X 1-1/2" 3 ML MISC     . B-D 3CC LUER-LOK SYR 21GX1-1/2 21G X 1-1/2" 3 ML MISC USE WEEKLY FOR DRAWING    . BD DISP NEEDLES 22G X 1-1/2" MISC     . dapagliflozin propanediol (FARXIGA) 10 MG TABS tablet Take 10 mg by mouth daily.    . Melatonin 10 MG CAPS Take 30 mg by mouth at bedtime as needed (sleep).    . metFORMIN (GLUCOPHAGE XR) 500 MG 24 hr tablet Take 2 tablets (1,000 mg total) by mouth in the morning and at bedtime. 360 tablet 2  . Multiple Vitamins-Minerals (MULTIVITAMIN ADULT) TABS Take 1 tablet by mouth daily with lunch.     . Omega-3 Fatty Acids (FISH OIL)  1000 MG CAPS Take 1,000 mg by mouth daily with lunch.     . Probiotic CAPS Take 2 capsules by mouth daily.    . sacubitril-valsartan (ENTRESTO) 24-26 MG Take 1 tablet by mouth 2 (two) times daily. 180 tablet 3  . sitaGLIPtin (JANUVIA) 100 MG tablet Take 1 tablet (100 mg total) by mouth daily. 30 tablet 2  . spironolactone (ALDACTONE) 25 MG tablet Take 0.5 tablets (12.5 mg total) by mouth daily. 45 tablet 3  . testosterone cypionate (DEPOTESTOTERONE CYPIONATE) 100 MG/ML injection Inject 80 mg into the muscle every Wednesday.     . triamcinolone cream (KENALOG) 0.1 % Apply 1 application topically 2 (two) times daily. 30 g 0  . amLODipine (NORVASC) 2.5 MG tablet Take 1 tablet (2.5 mg total) by mouth daily. 90 tablet 3  .  carvedilol (COREG) 12.5 MG tablet Take 1 tablet (12.5 mg total) by mouth 2 (two) times daily with a meal. 180 tablet 3   No current facility-administered medications for this encounter.   BP 140/80   Pulse 72   Wt 97 kg (213 lb 12.8 oz)   SpO2 98%   BMI 28.21 kg/m  General: NAD Neck: No JVD, no thyromegaly or thyroid nodule.  Lungs: Clear to auscultation bilaterally with normal respiratory effort. CV: Nondisplaced PMI.  Heart regular S1/S2, no S3/S4, no murmur.  No peripheral edema.  No carotid bruit.  Normal pedal pulses.  Abdomen: Soft, nontender, no hepatosplenomegaly, no distention.  Skin: Intact without lesions or rashes.  Neurologic: Alert and oriented x 3.  Psych: Normal affect. Extremities: No clubbing or cyanosis.  HEENT: Normal.   Assessment/Plan: 1. Chronic systolic CHF: Echo in 2/21 with EF 35-40%.  LHC in 2/21 with moderate coronary disease, but coronary disease appears out of proportion to the severity of the cardiomyopathy.  It is possible that long-standing HTN and DM have triggered the cardiomyopathy.  Cardiac MRI in 12/21 was a difficult study, showed EF 15% with diffuse hypokinesis, mildly decreased RV systolic function, no LGE.  No evidence for infiltrative disease or myocarditis.  - Continue Entresto 24/26 bid.  - Increase Coreg to 12.5 mg bid (currently taking 6.25 mg bid).  - Restart spironolactone 12.5 mg daily, will need to follow low K diet.  BMET today and in 10 days.  Can use Velt[place] if needed to allow him to stay on spironolactone.  - Decrease amlodipine to 2.5 mg daily to make sure BP does not drop low.  - Continue Farxiga 10 mg daily.  - Repeat echo in 6 wks after further medication titration.  If EF remains low, will need to consider ICD.  Not CRT candidate with narrow QRS.   2. CAD: Moderate disease, no intervention on 2/21 cath. No chest pain.  - Continue ASA 81 daily.  - Continue atorvastatin, good lipids in 6/21.  3. HTN: BP mildly elevated.   Adjusting meds as above.  4. CKD stage 3a: Solitary kidney.   Follow carefully.  - He is on [place].   Followup with HF pharmacist in 3 wks for med titration (increase Entresto, add on Veltassa if needed).  See me in 6 wks.   Marca Ancona 05/20/2020

## 2020-05-20 NOTE — Patient Instructions (Addendum)
Labs done today. We will contact you only if your labs are abnormal.   INCREASE Carvedilol to 12.5mg (1 tablet) by mouth daily. (a new prescription was sent into the pharmacy)  DECREASE Amlodipine 2.5mg  (1 tablet) by mouth daily.  START Spironolactone 12.5mg  (1 tablet) by mouth daily.  No other medication changes were made. Please continue all current medications as prescribed.  Your physician recommends that you schedule a follow-up appointment in: 10 days for a lab only appointment,3 weeks for an appointment with our Clinic Pharmacist, Lauren and in 6 weeks with Dr. Shirlee Latch with an echo prior to your exam  If you have any questions or concerns before your next appointment please send [place] a message through [place] or call our office at [phone].    TO LEAVE A MESSAGE FOR THE NURSE SELECT OPTION 2, PLEASE LEAVE A MESSAGE INCLUDING: . YOUR NAME . DATE OF BIRTH . CALL BACK NUMBER . REASON FOR CALL**this is important as we prioritize the call backs  YOU WILL RECEIVE A CALL BACK THE SAME DAY AS LONG AS YOU CALL BEFORE 4:00 PM   Do the following things EVERYDAY: 1) Weigh yourself in the morning before breakfast. Write it down and keep it in a log. 2) Take your medicines as prescribed 3) Eat low salt foods-Limit salt (sodium) to 2000 mg per day.  4) Stay as active as you can everyday 5) Limit all fluids for the day to less than 2 liters   At [facility], you and your health needs are our priority. As part of our continuing mission to provide you with exceptional heart care, we have created designated Provider Care Teams. These Care Teams include your primary Cardiologist (physician) and Advanced Practice Providers (APPs- Physician Assistants and Nurse Practitioners) who all work together to provide you with the care you need, when you need it.   You may see any of the following providers on your designated Care Team at your next follow up: Marland Kitchen Dr Arvilla Meres . Dr Marca Ancona . Tonye Becket, NP . Robbie Lis, PA . Karle Plumber, PharmD   Please be sure to bring in all your medications bottles to every appointment.   Low Potassium Diet  What are tips for following this plan? Reading food labels  Check the amount of salt (sodium) in foods. Choose foods that have less than 300 milligrams (mg) per serving.  Check the ingredient list for phosphorus or potassium-based additives or preservatives.  Check the amount of saturated fat and trans fat. Limit or avoid these fats as told by your dietitian. Shopping  Avoid buying foods that are: ? Processed or prepackaged. ? Calcium-enriched or that have calcium added to them (are fortified).  Do not buy foods that have salt or sodium listed among the first five ingredients.  Buy canned vegetables and beans that say "no salt added" or "low sodium" and rinse them before eating. Cooking  Soak vegetables, such as potatoes, before cooking to reduce potassium. To do this: 1. Peel and cut the vegetables into small pieces. 2. Soak the vegetables in warm water for at least 2 hours. For every 1 cup of vegetables, use 10 cups of water. 3. Drain and rinse the vegetables with warm water. 4. Boil the vegetables for at least 5 minutes. Meal planning  Limit the amount of protein you eat from plant and animal sources each day.  Do not add salt to food when cooking or before eating.  Eat meals and  snacks at around the same time each day. General information  Talk with your health care provider about whether you should take a vitamin and mineral supplement.  Use standard measuring cups and spoons to measure servings of foods. Use a kitchen scale to measure portions of protein foods.  If told by your health care provider, avoid drinking too much fluid. Measure and count all liquids, including water, ice, soups, flavored gelatin, and frozen desserts such as ice pops or ice cream. If you have  diabetes:  If you have diabetes (diabetes mellitus) and CKD, it is important to keep your blood sugar (glucose) in the target range recommended by your health care provider. Follow your diabetes management plan. This may include: ? Checking your blood glucose regularly. ? Taking medicines by mouth, taking insulin, or taking both. ? Exercising for at least 30 minutes on 5 or more days each week, or as told by your health care provider. ? Tracking how many servings of carbohydrates you eat at each meal. Potassium Potassium affects how steadily your heart beats. If too much potassium builds up in your blood, the potassium can cause an irregular heartbeat or even a heart attack. You may need to limit or avoid foods that are high in potassium, such as:  Milk and soy milk.  Fruits, such as bananas, apricots, nectarines, melon, prunes, raisins, kiwi, and oranges.  Vegetables, such as potatoes, sweet potatoes, yams, tomatoes, leafy greens, beets, avocado, pumpkin, and winter squash.  White and lima beans.  Whole-wheat breads and pastas.  Beans and nuts. Phosphorus Phosphorus is a mineral found in your bones. A balance between calcium and phosphorus is needed to build and maintain healthy bones. Too much phosphorus pulls calcium from your bones. This can make your bones weak and more likely to break. Too much phosphorus can also make your skin itch. You may need to limit or avoid foods that are high in phosphorus, such as:  Milk and dairy products.  Dried beans and peas.  Tofu, soy milk, and other soy-based meat replacements.  Dark-colored sodas.  Nuts and peanut butter.  Meat, poultry, and fish.  Bran cereals and oatmeal. Protein Protein helps you make and keep muscle. It also helps to repair your body's cells and tissues. One of the natural breakdown products of protein is a waste product called urea. When your kidneys are not working properly, they cannot remove wastes, such as  urea. Reducing how much protein you eat can help prevent a buildup of urea in your blood. . Sources of animal protein include:  Meat (all types).  Fish and seafood.  Poultry.  Eggs.  Dairy. Other protein foods include:  Beans and legumes.  Nuts and nut butter.  Soy and tofu.   Sodium Sodium helps to maintain a healthy balance of fluids in your body. Too much sodium can increase your blood pressure and have a negative effect on your heart and lungs. Too much sodium can also cause your body to retain too much fluid, making your kidneys work harder. Most people should have less than 2,300 mg of sodium each day. If you have hypertension, you may need to limit your sodium to 1,500 mg each day. You may need to limit or avoid foods that are high in sodium, such as:  Salt seasonings.  Soy sauce.  Cured and processed meats.  Salted crackers and snack foods.  Fast food.  Canned soups and most canned foods.  Pickled foods.  Vegetable juice.  Boxed mixes  or ready-to-eat boxed meals and side dishes.  Bottled dressings, sauces, and marinades. Talk with your dietitian about how much potassium, phosphorus, protein, and sodium you may have each day. This information is not intended to replace advice given to you by your health care provider. Make sure you discuss any questions you have with your health care provider. Document Revised: 07/31/2019 Document Reviewed: 07/31/2019 Elsevier Patient Education  2021 [employer].

## 2020-05-24 ENCOUNTER — Telehealth: Payer: Self-pay | Admitting: Family Medicine

## 2020-05-24 ENCOUNTER — Other Ambulatory Visit: Payer: Self-pay | Admitting: Family Medicine

## 2020-05-24 DIAGNOSIS — N6452 Nipple discharge: Secondary | ICD-10-CM

## 2020-05-30 ENCOUNTER — Other Ambulatory Visit: Payer: Self-pay

## 2020-05-30 ENCOUNTER — Ambulatory Visit (HOSPITAL_COMMUNITY)
Admission: RE | Admit: 2020-05-30 | Discharge: 2020-05-30 | Disposition: A | Discharge status: Home / Self Care | Payer: No Typology Code available for payment source | Source: Ambulatory Visit | Attending: Internal Medicine | Admitting: Internal Medicine

## 2020-05-30 DIAGNOSIS — I429 Cardiomyopathy, unspecified: Secondary | ICD-10-CM | POA: Diagnosis not present

## 2020-05-30 LAB — BASIC METABOLIC PANEL
Anion gap: 8 (ref 5–15)
BUN: 14 mg/dL (ref 8–23)
CO2: 27 mmol/L (ref 22–32)
Calcium: 9.7 mg/dL (ref 8.9–10.3)
Chloride: 105 mmol/L (ref 98–111)
Creatinine, Ser: 1.52 mg/dL — ABNORMAL HIGH (ref 0.61–1.24)
GFR, Estimated: 51 mL/min — ABNORMAL LOW (ref >60)
Glucose, Bld: 126 mg/dL — ABNORMAL HIGH (ref 70–99)
Potassium: 4.1 mmol/L (ref 3.5–5.1)
Sodium: 140 mmol/L (ref 135–145)

## 2020-05-31 IMAGING — DX DG FOOT COMPLETE 3+V*L*
3 series · 3 of 3 positions shown · non-contrast
Comparison: None.

CLINICAL DATA: Mid foot pain after injury.  Kicked chair with foot.

EXAM:
LEFT FOOT - COMPLETE 3+ VIEW

[foot ap]
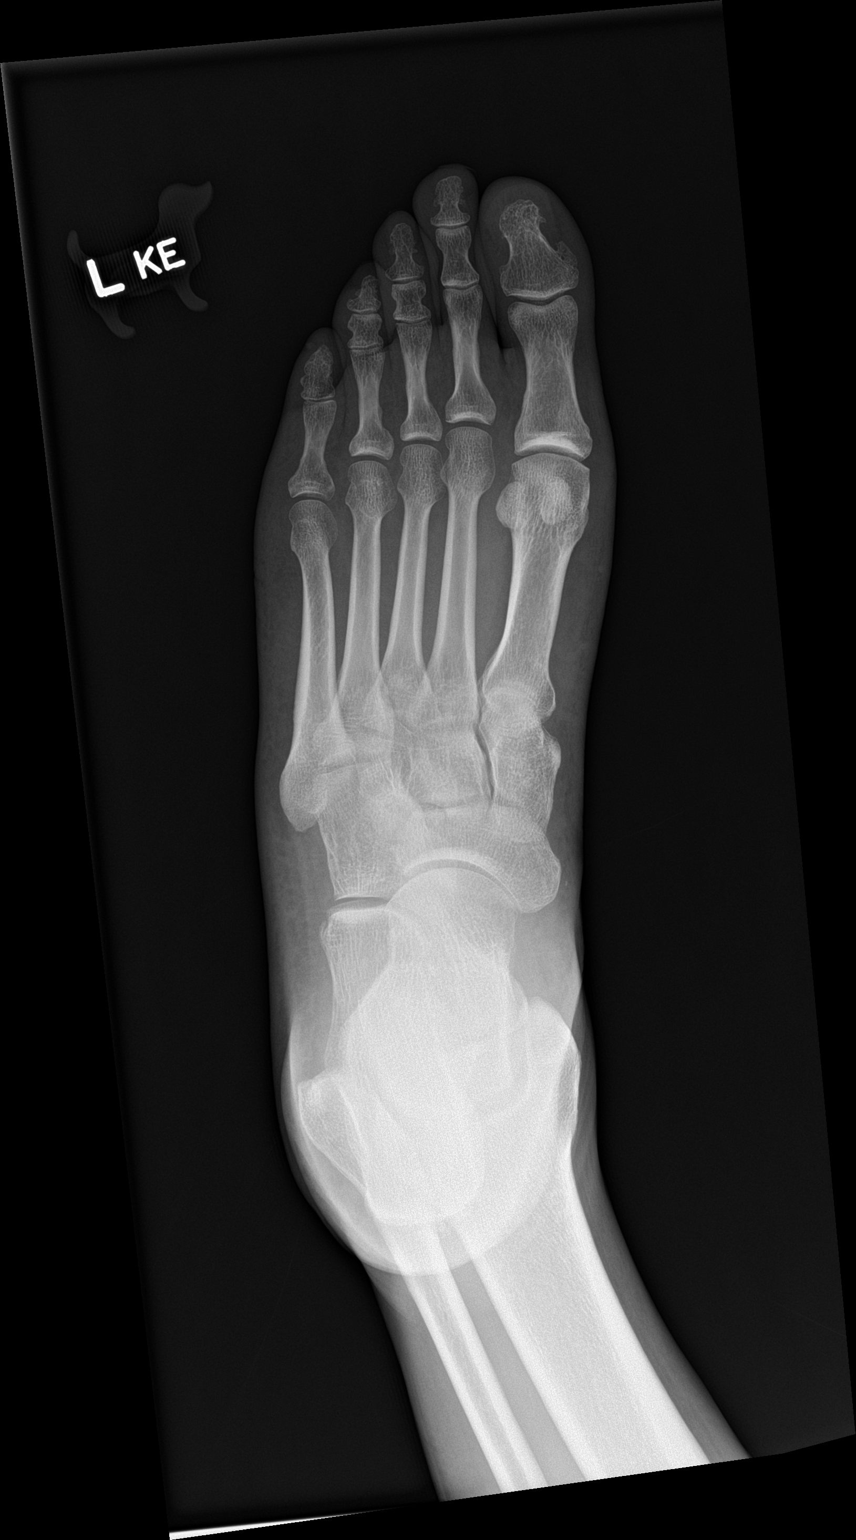

[foot obl]
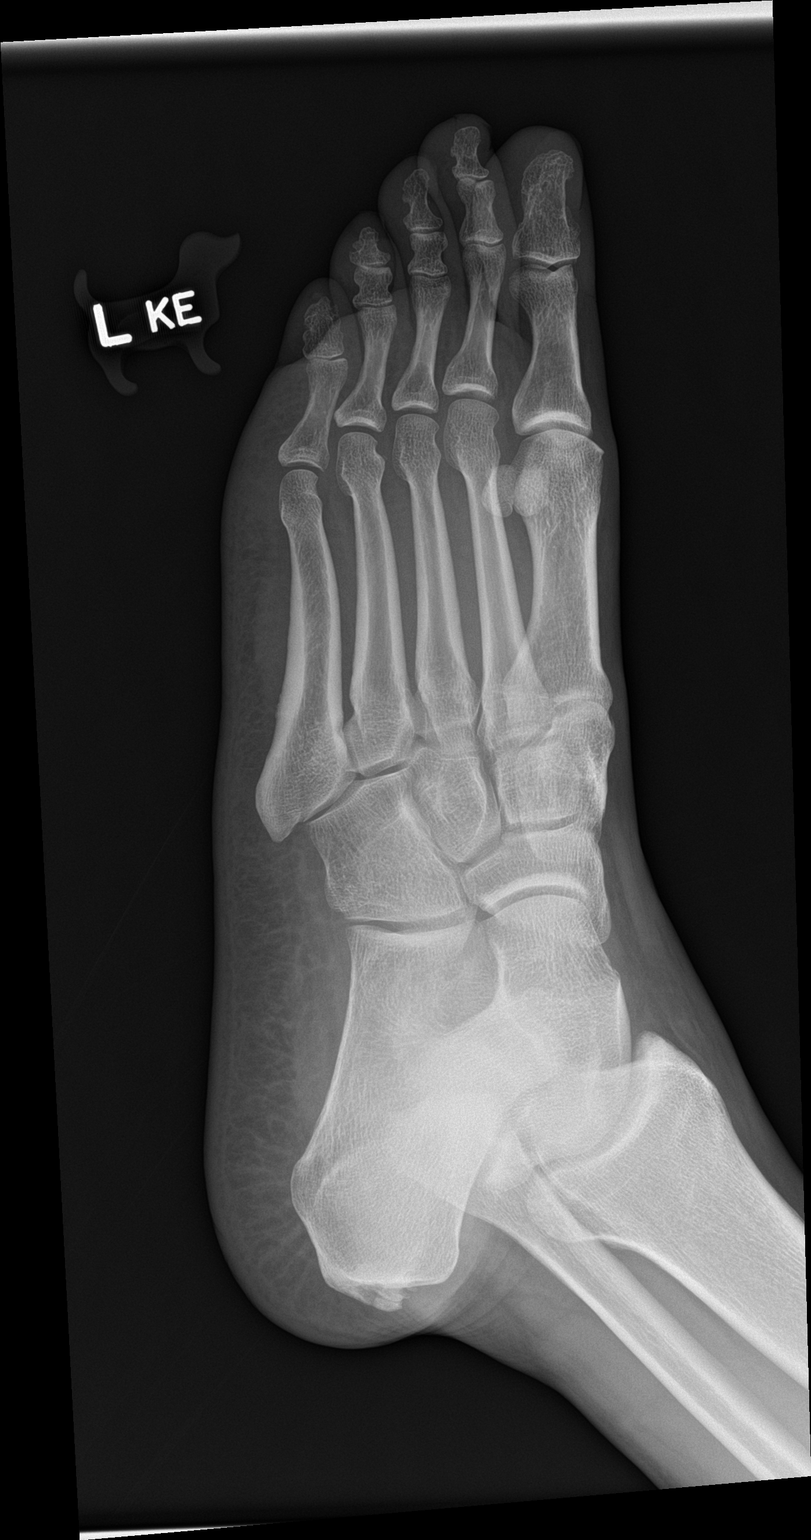

[foot lat]
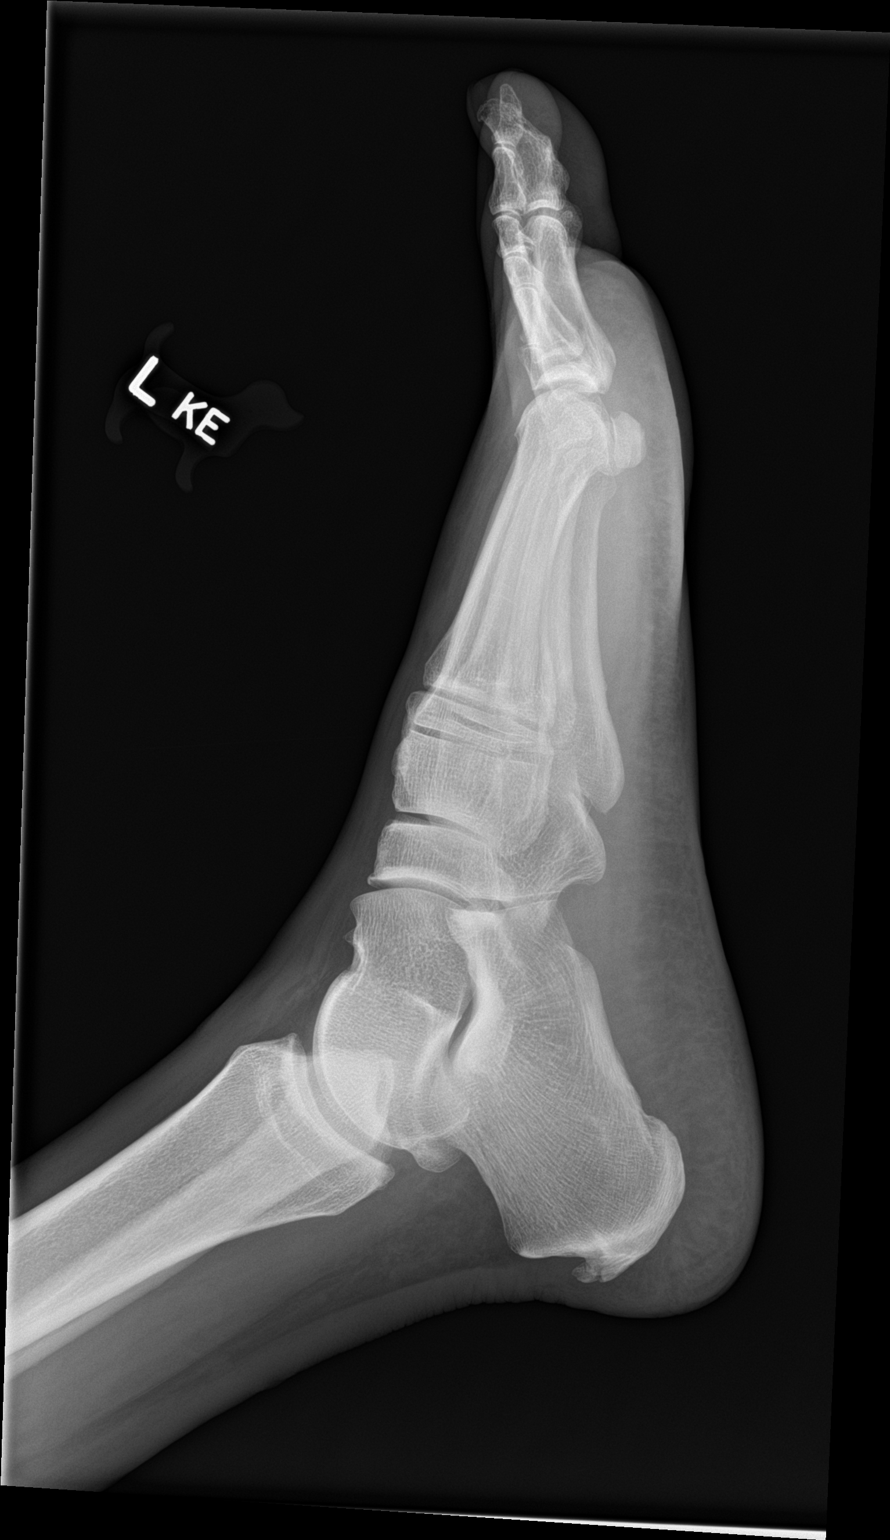

[3 of 3 positions shown; findings below may reference images not displayed]

FINDINGS: There is no evidence of fracture or dislocation. There is no
evidence of arthropathy or other focal bone abnormality. Soft
tissues are unremarkable.
IMPRESSION: Negative.

## 2020-06-02 IMAGING — US US ABDOMEN LIMITED
1 series · 14 of 25 positions shown · non-contrast
Comparison: Noncontrast chest CT 04/13/2019.

CLINICAL DATA: 59-year-old male with elevated AST.

EXAM:
ULTRASOUND ABDOMEN LIMITED RIGHT UPPER QUADRANT

[Series 1: us abdomen limited · 14 of 64 slices shown]
[im 1/64]
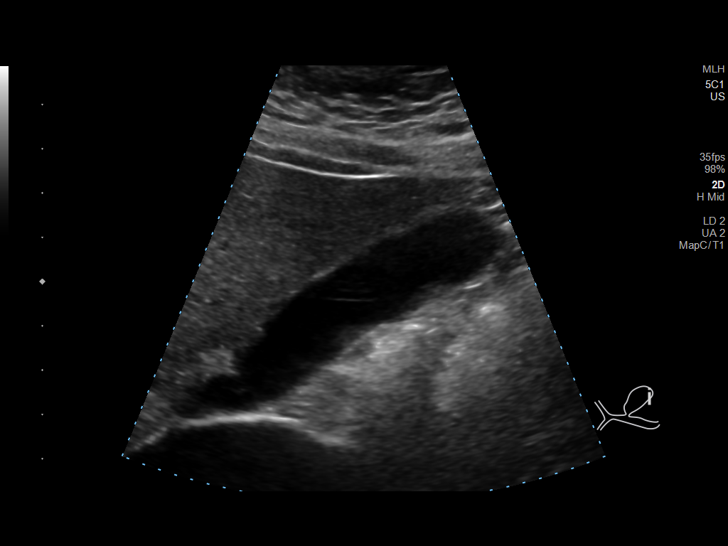
[im 6/64]
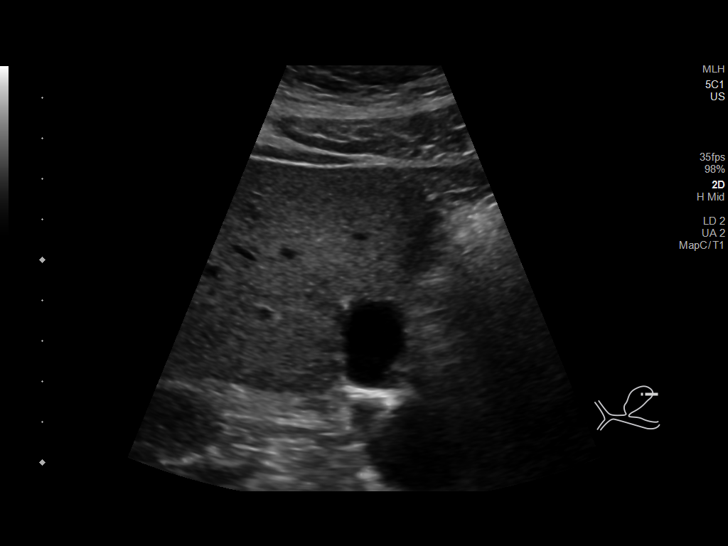
[im 11/64]
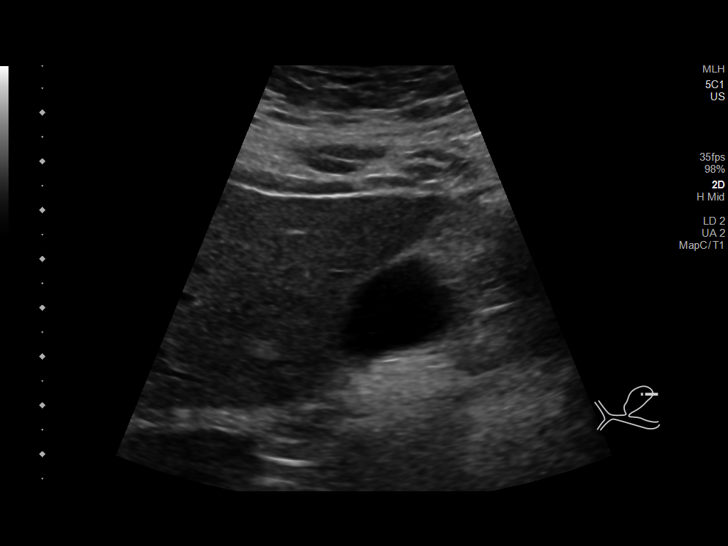
[im 16/64]
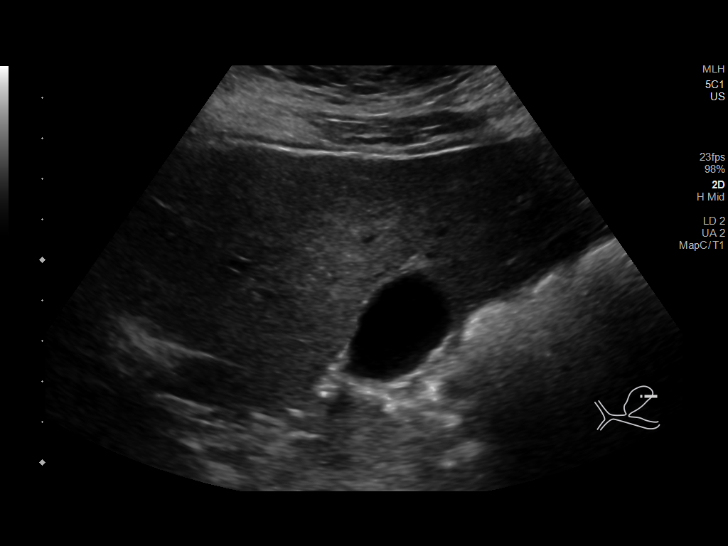
[im 22/64]
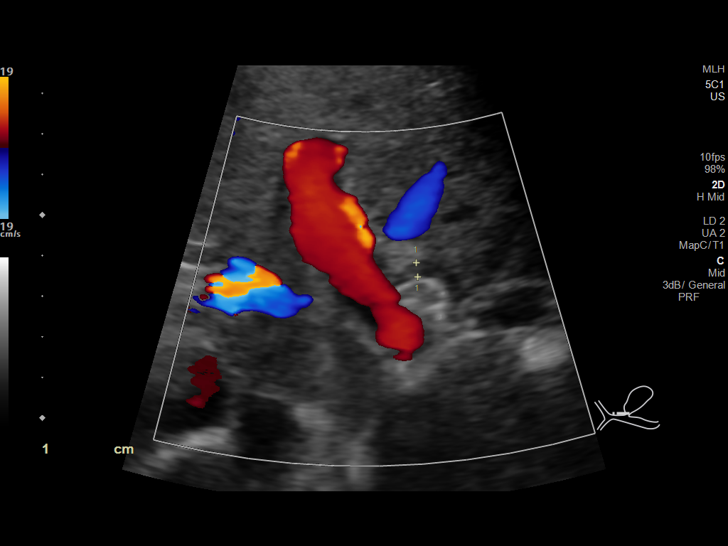
[im 24/64]
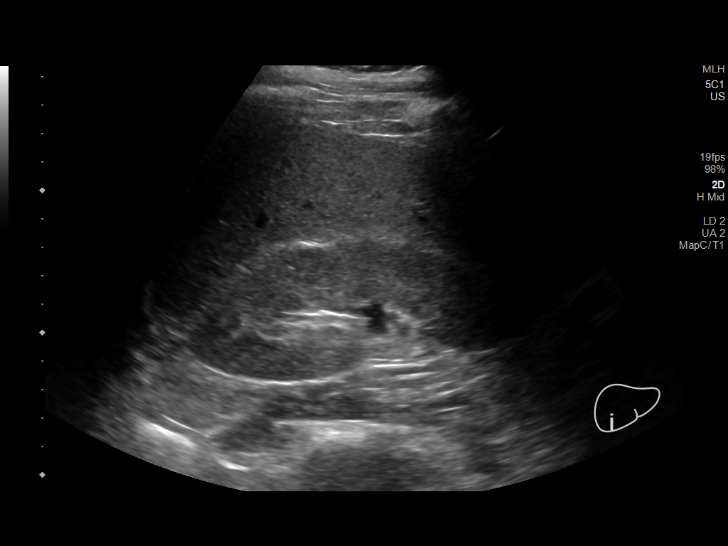
[im 29/64]
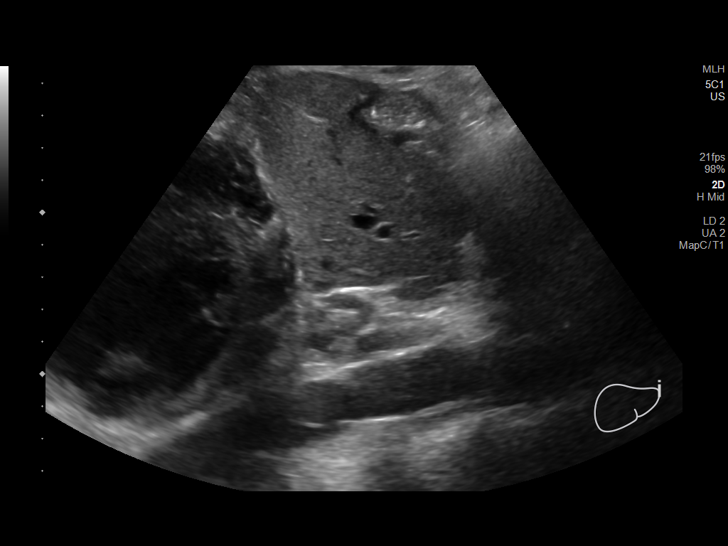
[im 35/64]
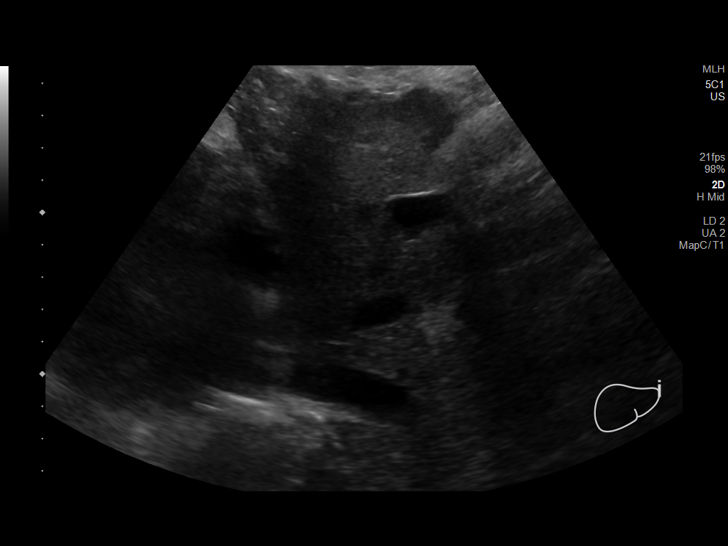
[im 40/64]
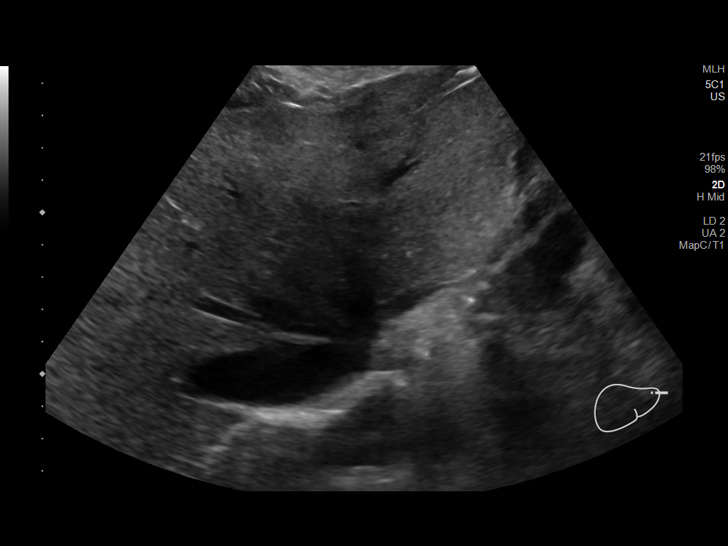
[im 43/64]
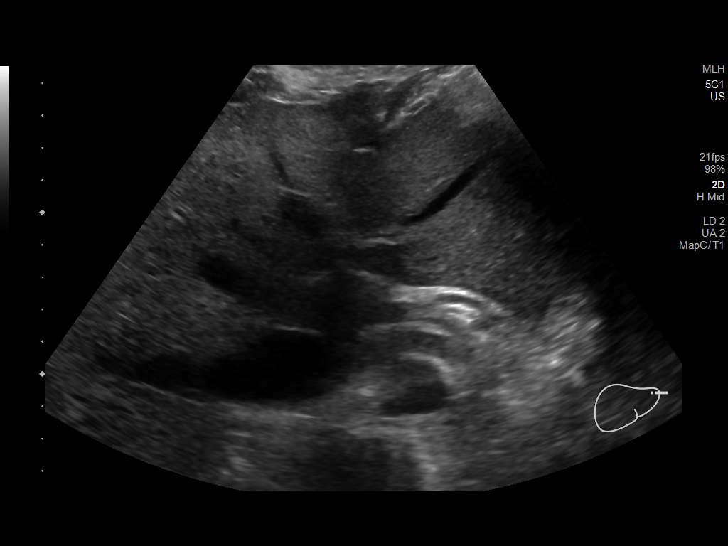
[im 48/64]
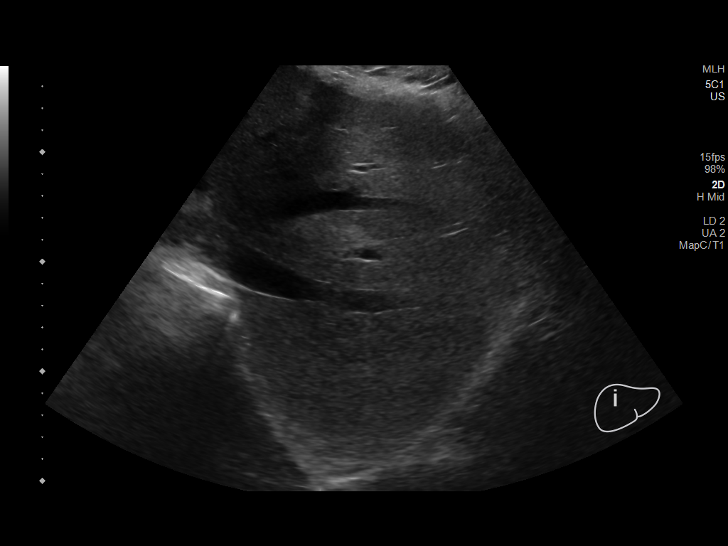
[im 53/64]
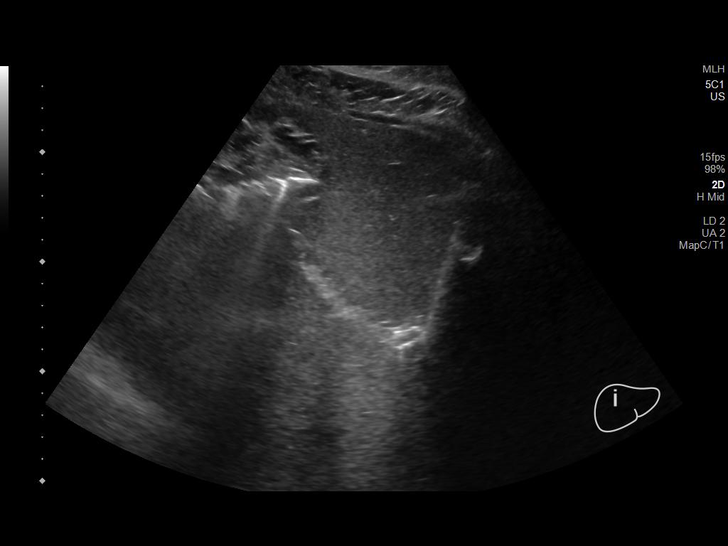
[im 58/64]
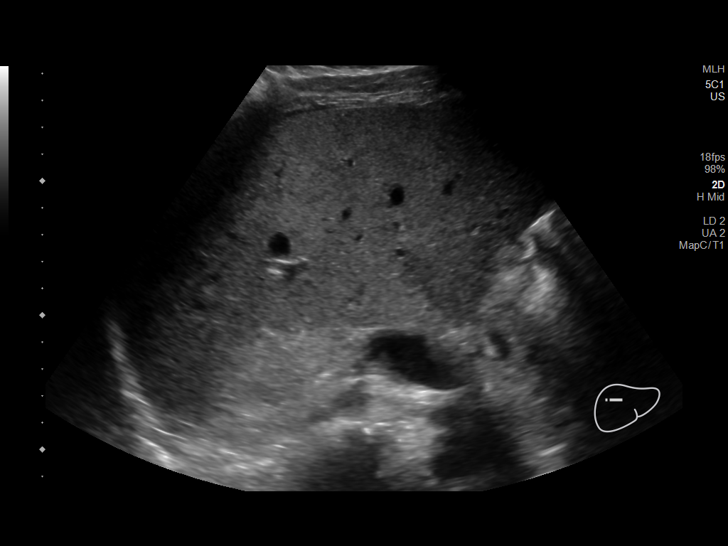
[im 64/64]
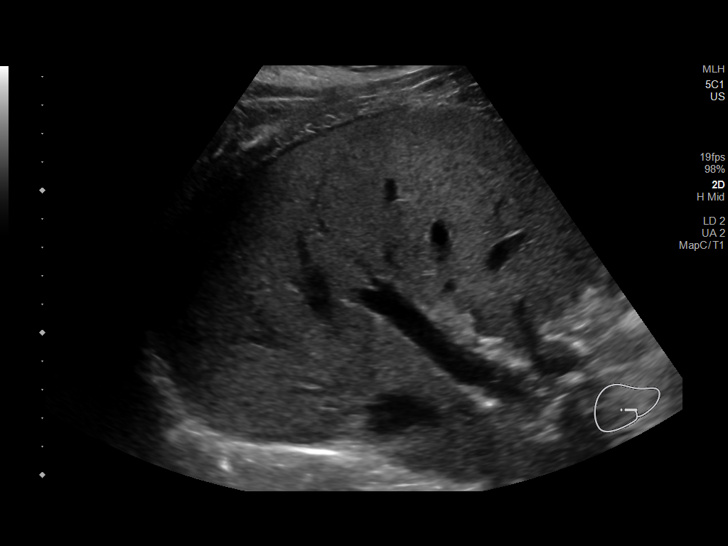

[14 of 25 positions shown; findings below may reference images not displayed]

FINDINGS: Gallbladder:

No gallstones or wall thickening visualized. No sonographic Murphy
sign noted by sonographer.

Common bile duct:

Diameter: Difficult to visualize but approximately 4 mm (image 23),
normal.

Liver:

No focal lesion identified. Within normal limits in parenchymal
echogenicity. Portal vein is patent on color Doppler imaging with
normal direction of blood flow towards the liver.

Other: Negative visible right kidney.
IMPRESSION: Normal right upper quadrant ultrasound.

## 2020-06-06 ENCOUNTER — Other Ambulatory Visit (HOSPITAL_BASED_OUTPATIENT_CLINIC_OR_DEPARTMENT_OTHER): Payer: Self-pay | Admitting: Dermatology

## 2020-06-06 MED FILL — TRIAMCINOLONE 0.1% OINTMENT: 0.1 | 30 days supply | Qty: 80 | Fill #0

## 2020-06-06 MED FILL — MUPIROCIN 2% OINTMENT: 2 | 7 days supply | Qty: 22 | Fill #0

## 2020-06-10 NOTE — Progress Notes (Incomplete)
***In Progress***  PCP: Sharlene Dory, DO Cardiology: Dr. Tomie China HF Cardiology: Dr. Shirlee Latch  HPI:   60 y.o. with history of CKD stage 3, HTN, and diabetes was referred for evaluation of CHF by Dr. Tomie China.  Patient has had diabetes and HTN for about 10-15 years per his report.  He has CKD stage 3 in setting of nephrectomy done in childhood due to trauma, follows with nephrology.  He developed atypical chest pain in 05/2019 and had Cardiolite done.  This showed EF 23% with fixed inferior defect.  Echo showed EF 35-40%, mild LV dilation, no LVH, normal RV.  He then had LHC done, showing OM1 65% stenosis, distal LAD 70% stenosis.  Cardiomyopathy was thought to be out of proportion to CAD. No recent viral-type infection symptoms.  No strong family history of CHF.    Cardiac MRI in 03/2020 was a difficult study, EF 15% with diffuse hypokinesis, mildly decreased RV systolic function, no LGE.    He had mildly elevated K on a prior BMET and spironolactone was stopped.   He returned for followup of CHF on 05/20/20. He had been doing well.  No exertional dyspnea or chest pain.  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  No orthopnea/PND.  No lightheadedness or palpitations.    Today he returns to HF clinic for pharmacist medication titration. At last visit with MD, carvedilol was increased to 12.5 mg BID, spironolactone was restarted at 12.5 mg daily, and amlodipine was decreased to 2.5 mg daily.   Overall feeling ***. Dizziness, lightheadedness, fatigue:  Chest pain or palpitations:  How is your breathing?: *** SOB: Able to complete all ADLs. Activity level ***  Weight at home pounds. Takes furosemide/torsemide/bumex *** mg *** daily.  LEE PND/Orthopnea  Appetite *** Low-salt diet:   Physical Exam Cost/affordability of meds   HF Medications: Carvedilol 12.5 mg BID Entresto 24-26 mg BID Spironolactone 12.5 mg daily Farxiga 10 mg  daily  Has the patient been experiencing any side effects to the medications prescribed?  {YES NO:22349}  Does the patient have any problems obtaining medications due to transportation or finances?   {YES NO:22349}  Understanding of regimen: {excellent/good/fair/poor:19665} Understanding of indications: {excellent/good/fair/poor:19665} Potential of compliance: {excellent/good/fair/poor:19665} Patient understands to avoid NSAIDs. Patient understands to avoid decongestants.    Pertinent Lab Values (06/10/20) . Serum creatinine 1.52, BUN 14, Potassium 4.1, Sodium 140   Vital Signs: . Weight: *** (last clinic weight: 213.8 lbs) . Blood pressure: ***  . Heart rate: ***   Assessment/Plan: 1. Chronic systolic CHF: Echo in 05/2019 with EF 35-40%.  LHC in 05/2019 with moderate coronary disease, but coronary disease appears out of proportion to the severity of the cardiomyopathy.  It is possible that long-standing HTN and DM have triggered the cardiomyopathy.  Cardiac MRI in 03/2020 was a difficult study, showed EF 15% with diffuse hypokinesis, mildly decreased RV systolic function, no LGE.  No evidence for infiltrative disease or myocarditis.  - NYHA Class *** symptoms, euvolemic upon exam.  - Continue carvedilol 12.5 mg BID.  - Continue Entresto 24/26 BID.  - Continue spironolactone 12.5 mg daily, will need to follow low K diet.    - Continue Farxiga 10 mg daily.  - Repeat ECHO and follow up with Dr. Shirlee Latch on 07/11/20.  2. CAD: Moderate disease, no intervention on 05/2019 cath. No chest pain.  - Continue ASA 81 daily.  - Continue atorvastatin, LDL 60 on 10/18/19.  3. HTN: BP mildly elevated. - Continue carvedilol, Entresto, and amlodipine.    4. CKD stage 3a: Solitary kidney.   Follow carefully.  - He is on [place].    Karle Plumber, PharmD, BCPS, BCCP, CPP [facility] Clinic Pharmacist [phone]

## 2020-06-11 ENCOUNTER — Other Ambulatory Visit: Payer: Self-pay

## 2020-06-11 ENCOUNTER — Ambulatory Visit (HOSPITAL_COMMUNITY)
Admission: RE | Admit: 2020-06-11 | Discharge: 2020-06-11 | Disposition: A | Discharge status: Home / Self Care | Payer: No Typology Code available for payment source | Source: Ambulatory Visit | Attending: Cardiology | Admitting: Cardiology

## 2020-06-11 ENCOUNTER — Other Ambulatory Visit (HOSPITAL_COMMUNITY): Payer: Self-pay | Admitting: Cardiology

## 2020-06-11 VITALS — BP 120/76 | HR 76 | Wt 202.0 lb

## 2020-06-11 DIAGNOSIS — E1122 Type 2 diabetes mellitus with diabetic chronic kidney disease: Secondary | ICD-10-CM | POA: Diagnosis not present

## 2020-06-11 DIAGNOSIS — I110 Hypertensive heart disease with heart failure: Secondary | ICD-10-CM | POA: Insufficient documentation

## 2020-06-11 DIAGNOSIS — Z905 Acquired absence of kidney: Secondary | ICD-10-CM | POA: Insufficient documentation

## 2020-06-11 DIAGNOSIS — Z7984 Long term (current) use of oral hypoglycemic drugs: Secondary | ICD-10-CM | POA: Insufficient documentation

## 2020-06-11 DIAGNOSIS — I5022 Chronic systolic (congestive) heart failure: Secondary | ICD-10-CM | POA: Diagnosis not present

## 2020-06-11 DIAGNOSIS — I2510 Atherosclerotic heart disease of native coronary artery without angina pectoris: Secondary | ICD-10-CM | POA: Diagnosis not present

## 2020-06-11 DIAGNOSIS — N1831 Chronic kidney disease, stage 3a: Secondary | ICD-10-CM | POA: Insufficient documentation

## 2020-06-11 DIAGNOSIS — Z79899 Other long term (current) drug therapy: Secondary | ICD-10-CM | POA: Insufficient documentation

## 2020-06-11 DIAGNOSIS — Z7982 Long term (current) use of aspirin: Secondary | ICD-10-CM | POA: Diagnosis not present

## 2020-06-11 MED ORDER — SACUBITRIL-VALSARTAN 49-51 MG PO TABS
1.0000 | ORAL_TABLET | Freq: Two times a day (BID) | ORAL | 3 refills | Status: DC
Start: 2020-06-11 — End: 2020-06-11

## 2020-06-11 MED FILL — ENTRESTO 49 MG-51 MG TABLET: 49-51 | 90 days supply | Qty: 180 | Fill #0

## 2020-06-11 NOTE — Progress Notes (Signed)
PCP: Sharlene Dory, DO Cardiology: Dr. Tomie China HF Cardiology: Dr. Shirlee Latch  HPI:   60 y.o. with history of CKD stage 3, HTN, and diabetes was referred for evaluation of CHF by Dr. Tomie China.  Patient has had diabetes and HTN for about 10-15 years per his report.  He has CKD stage 3 in setting of nephrectomy done in childhood due to trauma, follows with nephrology.  He developed atypical chest pain in 05/2019 and had Cardiolite done.  This showed EF 23% with fixed inferior defect.  Echo showed EF 35-40%, mild LV dilation, no LVH, normal RV.  He then had LHC done, showing OM1 65% stenosis, distal LAD 70% stenosis.  Cardiomyopathy was thought to be out of proportion to CAD. No recent viral-type infection symptoms.  No strong family history of CHF.    Cardiac MRI in 03/2020 was a difficult study, EF 15% with diffuse hypokinesis, mildly decreased RV systolic function, no LGE.    He had mildly elevated K on a prior BMET and spironolactone was stopped.   He returned to HF Clinic for followup of CHF on 05/20/20. He had been doing well.  No exertional dyspnea or chest pain.  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  No orthopnea/PND.  No lightheadedness or palpitations.    Today he returns to HF clinic for pharmacist medication titration. At last visit with MD, carvedilol was increased to 12.5 mg BID, spironolactone was restarted at 12.5 mg daily, and amlodipine was decreased to 2.5 mg daily. Overall feeling good today. Sometimes feels lightheaded when he suddenly stands up, but this is not bothersome. No fatigue. No chest pain or palpitations. No SOB/DOE. Weight is stable at home around 198 lbs. No diuretic use at home. No LEE/PND/Orthopnea. His appetite is doing well. He is taking all of his medications as prescribed with no issues.   HF Medications: Carvedilol 12.5 mg BID Entresto 24-26 mg BID Spironolactone 12.5 mg daily Farxiga 10 mg daily  Has  the patient been experiencing any side effects to the medications prescribed?  No, patient is tolerating his medications well.  Does the patient have any problems obtaining medications due to transportation or finances?   No, patient has [employer].   Understanding of regimen: good Understanding of indications: good Potential of compliance: good Patient understands to avoid NSAIDs. Patient understands to avoid decongestants.    Pertinent Lab Values (05/30/20) . Serum creatinine 1.52, BUN 14, Potassium 4.1, Sodium 140   Vital Signs: . Weight: 202 lbs (last clinic weight: 213.8 lbs) . Blood pressure: 120/76  . Heart rate: 76  Assessment/Plan: 1. Chronic systolic CHF: Echo in 05/2019 with EF 35-40%.  LHC in 05/2019 with moderate coronary disease, but coronary disease appears out of proportion to the severity of the cardiomyopathy.  It is possible that long-standing HTN and DM have triggered the cardiomyopathy.  Cardiac MRI in 03/2020 was a difficult study, showed EF 15% with diffuse hypokinesis, mildly decreased RV systolic function, no LGE.  No evidence for infiltrative disease or myocarditis.  - NYHA Class I to II symptoms, euvolemic upon exam.  - Continue carvedilol 12.5 mg BID.  - Increase Entresto to 49-51 mg BID. Repeat BMET in 2 weeks.  - Continue spironolactone 12.5 mg daily. Previously discontinued due to hyperkalemia, but those labs were hemolyzed. Labs have been stable on last 3 checks.  - Continue Farxiga 10 mg daily. - Discontinue amlodipine with increase  in [place] dose  - Repeat ECHO and follow up with Dr. Shirlee Latch on 07/11/20.  2. CAD: Moderate disease, no intervention on 05/2019 cath. No chest pain.  - Continue ASA 81 daily.  - Continue atorvastatin 40 mg daily, LDL 60 on 10/18/19.   3. HTN: - Continue carvedilol - Entresto and amlodipine changes noted above.    4. CKD stage 3a: Solitary kidney.   Follow carefully.  - He is on [place].     Karle Plumber, PharmD, BCPS, BCCP, CPP Heart Failure Clinic Pharmacist [phone]

## 2020-06-11 NOTE — Patient Instructions (Addendum)
It was a pleasure seeing you today!  MEDICATIONS: -We are changing your medications today -Increase Entresto to 49/51 mg (1 tablet) twice daily. You may take 2 tablets of the 24/26 mg strength twice daily until you pick up the new strength.  - Stop amlodipine  -Call if you have questions about your medications.   NEXT APPOINTMENT: Return to clinic in 1 month with Dr. Shirlee Latch.  In general, to take care of your heart failure: -Limit your fluid intake to 2 Liters (half-gallon) per day.   -Limit your salt intake to ideally 2-3 grams (2000-3000 mg) per day. -Weigh yourself daily and record, and bring that "weight diary" to your next appointment.  (Weight gain of 2-3 pounds in 1 day typically means fluid weight.) -The medications for your heart are to help your heart and help you live longer.   -Please contact us before stopping any of your heart medications.  Call the clinic at [phone] with questions or to reschedule future appointments.

## 2020-06-13 MED FILL — BD 3 ML SYRINGE 18GX1-1/2: 18G X 1-1/2 | 28 days supply | Qty: 4 | Fill #3

## 2020-06-19 MED FILL — ENTRESTO 49 MG-51 MG TABLET: 49-51 | 90 days supply | Qty: 180 | Fill #0

## 2020-06-19 MED FILL — BD 3 ML SYRINGE 18GX1-1/2: 18G X 1-1/2 | 28 days supply | Qty: 4 | Fill #3

## 2020-06-20 MED FILL — BD 3 ML SYRINGE 18GX1-1/2: 18G X 1-1/2 | 28 days supply | Qty: 4 | Fill #4

## 2020-06-26 ENCOUNTER — Telehealth: Payer: Self-pay

## 2020-06-26 ENCOUNTER — Other Ambulatory Visit: Payer: Self-pay

## 2020-06-26 ENCOUNTER — Other Ambulatory Visit (HOSPITAL_COMMUNITY): Payer: Self-pay | Admitting: Cardiology

## 2020-06-26 ENCOUNTER — Ambulatory Visit (HOSPITAL_COMMUNITY)
Admission: RE | Admit: 2020-06-26 | Discharge: 2020-06-26 | Disposition: A | Discharge status: Home / Self Care | Payer: No Typology Code available for payment source | Source: Ambulatory Visit | Attending: Cardiology | Admitting: Cardiology

## 2020-06-26 DIAGNOSIS — I429 Cardiomyopathy, unspecified: Secondary | ICD-10-CM

## 2020-06-26 DIAGNOSIS — I10 Essential (primary) hypertension: Secondary | ICD-10-CM | POA: Diagnosis present

## 2020-06-26 LAB — BASIC METABOLIC PANEL WITH GFR
Anion gap: 11 (ref 5–15)
BUN: 17 mg/dL (ref 8–23)
CO2: 21 mmol/L — ABNORMAL LOW (ref 22–32)
Calcium: 9.8 mg/dL (ref 8.9–10.3)
Chloride: 105 mmol/L (ref 98–111)
Creatinine, Ser: 1.33 mg/dL — ABNORMAL HIGH (ref 0.61–1.24)
GFR, Estimated: >60 mL/min
Glucose, Bld: 131 mg/dL — ABNORMAL HIGH (ref 70–99)
Potassium: 4.2 mmol/L (ref 3.5–5.1)
Sodium: 137 mmol/L (ref 135–145)

## 2020-06-26 NOTE — Progress Notes (Signed)
bmet  

## 2020-06-26 NOTE — Addendum Note (Signed)
Encounter addended by: Suezanne Cheshire, RN on: 06/26/2020 4:00 PM  Actions taken: Order list changed, Diagnosis association updated

## 2020-06-26 NOTE — Telephone Encounter (Signed)
-----   Message from Baldo Daub, MD sent at 06/26/2020  3:56 PM EST ----- Good result stable CKD and potassium

## 2020-06-26 NOTE — Telephone Encounter (Signed)
Left message on patients voicemail to please return our call.   

## 2020-06-27 ENCOUNTER — Telehealth: Payer: Self-pay

## 2020-06-27 MED FILL — METFORMIN HCL ER 500 MG TB2: 500 | 90 days supply | Qty: 360 | Fill #0

## 2020-06-27 MED FILL — JANUVIA 100 MG TABLET: 100 | 30 days supply | Qty: 30 | Fill #1

## 2020-06-27 NOTE — Telephone Encounter (Signed)
Spoke with patient regarding results and recommendation.  Patient verbalizes understanding and is agreeable to plan of care. Advised patient to call back with any issues or concerns.  

## 2020-06-27 NOTE — Telephone Encounter (Signed)
-----   Message from Baldo Daub, MD sent at 06/26/2020  3:56 PM EST ----- Good result stable CKD and potassium

## 2020-07-04 ENCOUNTER — Other Ambulatory Visit: Payer: Self-pay

## 2020-07-04 DIAGNOSIS — I509 Heart failure, unspecified: Secondary | ICD-10-CM | POA: Insufficient documentation

## 2020-07-05 ENCOUNTER — Encounter: Payer: Self-pay | Admitting: Cardiology

## 2020-07-05 ENCOUNTER — Other Ambulatory Visit: Payer: Self-pay

## 2020-07-05 ENCOUNTER — Ambulatory Visit (INDEPENDENT_AMBULATORY_CARE_PROVIDER_SITE_OTHER): Payer: No Typology Code available for payment source | Admitting: Cardiology

## 2020-07-05 VITALS — BP 108/70 | HR 85 | Ht 73.0 in | Wt 196.1 lb

## 2020-07-05 DIAGNOSIS — I10 Essential (primary) hypertension: Secondary | ICD-10-CM

## 2020-07-05 DIAGNOSIS — I429 Cardiomyopathy, unspecified: Secondary | ICD-10-CM

## 2020-07-05 DIAGNOSIS — E782 Mixed hyperlipidemia: Secondary | ICD-10-CM

## 2020-07-05 DIAGNOSIS — I2510 Atherosclerotic heart disease of native coronary artery without angina pectoris: Secondary | ICD-10-CM | POA: Diagnosis not present

## 2020-07-05 DIAGNOSIS — E088 Diabetes mellitus due to underlying condition with unspecified complications: Secondary | ICD-10-CM | POA: Diagnosis not present

## 2020-07-05 NOTE — Patient Instructions (Signed)
Medication Instructions:  Your physician recommends that you continue on your current medications as directed. Please refer to the Current Medication list given to you today.  *If you need a refill on your cardiac medications before your next appointment, please call your pharmacy*   Lab Work: Your physician recommends that you return for lab work today: bmp   If you have labs (blood work) drawn today and your tests are completely normal, you will receive your results only by: Marland Kitchen MyChart Message (if you have MyChart) OR . A paper copy in the mail If you have any lab test that is abnormal or we need to change your treatment, we will call you to review the results.   Testing/Procedures: None   Follow-Up: At [facility], you and your health needs are our priority.  As part of our continuing mission to provide you with exceptional heart care, we have created designated Provider Care Teams.  These Care Teams include your primary Cardiologist (physician) and Advanced Practice Providers (APPs -  Physician Assistants and Nurse Practitioners) who all work together to provide you with the care you need, when you need it.  We recommend signing up for the patient portal called "MyChart".  Sign up information is provided on this After Visit Summary.  MyChart is used to connect with patients for Virtual Visits (Telemedicine).  Patients are able to view lab/test results, encounter notes, upcoming appointments, etc.  Non-urgent messages can be sent to your provider as well.   To learn more about what you can do with MyChart, go to [email or web address].    Your next appointment:   2 month(s)  The format for your next appointment:   In Person  Provider:   Belva Crome, MD   Other Instructions

## 2020-07-05 NOTE — Progress Notes (Signed)
Cardiology Office Note:    Date:  07/05/2020   ID:  Michael Guerra, DOB [birth year], MRN [id number]  PCP:  Sharlene Dory, DO  Cardiologist:  Garwin Brothers, MD   Referring MD: Sharlene Dory*    ASSESSMENT:    1. Coronary artery disease involving native coronary artery of native heart without angina pectoris   2. Cardiomyopathy, unspecified type (HCC)   3. Essential hypertension   4. Diabetes mellitus due to underlying condition with unspecified complications (HCC)   5. Mixed dyslipidemia    PLAN:    In order of problems listed above:  1. Coronary artery disease: Secondary prevention stressed with the patient.  Importance of compliance with diet medication stressed any vocalized understanding.  He is walking on a regular basis. 2. Cardiomyopathy ischemic: He has symptoms of mild orthostatic hypotension.  I told him to increase salt and fluid intake in the diet only in the very mild manner.  I told him if his symptoms persist then he should back off on the [facility] and go back to the previous lower dose.  He understands.  I want to get the best effect Entresto if he can tolerate it.  I also want him to get an optimal dose.  He understands.  He will weigh himself on a regular basis and keep a track of his weight.  Congestive heart failure issues and diet and education was also emphasized. 3. Essential hypertension: Blood pressure stable.  As mentioned above. 4. Mixed dyslipidemia: On statin therapy and stable.  Diet was emphasized. 5. Renal insufficiency: Followed by primary care physician.  Numbers appear stable at this time. 6. Patient will be seen in follow-up appointment in 2 months or earlier if the patient has any concerns.  He was advised to give [place] a call if he has any questions about the aforementioned issues.    Medication Adjustments/Labs and Tests Ordered: Current medicines are reviewed at length with the patient today.  Concerns regarding medicines are  outlined above.  No orders of the defined types were placed in this encounter.  No orders of the defined types were placed in this encounter.    No chief complaint on file.    History of Present Illness:    Michael Guerra is a 60 y.o. male.  Patient has past medical history of coronary artery disease and cardiomyopathy, congestive heart failure, solitary kidney, essential hypertension and diabetes mellitus.  He denies any problems at this time and takes care of activities of daily living.  He has renal insufficiency.  Patient mentions to me that since his Sherryll Burger was increased he feels a little lightheaded when he changes posture.  His symptoms are suggestive of mild orthostatic hypotension.  At the time of my evaluation, the patient is alert awake oriented and in no distress.  Past Medical History:  Diagnosis Date  . CAD (coronary artery disease) 06/08/2019  . Cardiomyopathy (HCC) 05/23/2019  . Cataract   . Chest discomfort 05/12/2019  . CHF (congestive heart failure) (HCC)   . Chronic kidney disease    one kidney that is healthy  . Chronic left shoulder pain 02/05/2020  . Diabetes mellitus due to underlying condition with unspecified complications (HCC) 05/12/2019  . Essential hypertension 05/12/2019  . Foot sprain, left, initial encounter 06/22/2019  . LIVER FUNCTION TESTS, ABNORMAL 05/21/2006   Qualifier: Diagnosis of  By: Maple Hudson MD, Riki Rusk    . Mixed dyslipidemia 05/12/2019  . Other specified abnormal findings of blood chemistry  05/20/2011   Formatting of this note might be different from the original. Elevated liver function tests 10/1 IMO update  . Rectal polyp 05/20/2011   Formatting of this note might be different from the original. Noted march 2011 fu in march 2016  . Renal insufficiency 05/23/2019  . Skin lesion of chest wall 05/20/2011   Formatting of this note might be different from the original. Mid chest (prob seb/epidermoid cyst)  . Solitary kidney   . Type 2 diabetes mellitus  with hyperglycemia, without long-term current use of insulin (HCC) 02/17/2006   Qualifier: Diagnosis of  By: Maple Hudson MD, Riki Rusk      Past Surgical History:  Procedure Laterality Date  . ABDOMINAL SURGERY    . KIDNEY SURGERY     bowel obstruction  x 3  . LEFT HEART CATH AND CORONARY ANGIOGRAPHY N/A 05/31/2019   Procedure: LEFT HEART CATH AND CORONARY ANGIOGRAPHY;  Surgeon: Iran Ouch, MD;  Location: [facility];  Service: Cardiovascular;  Laterality: N/A;    Current Medications: Current Meds  Medication Sig  . acetaminophen (TYLENOL) 500 MG tablet Take 1,000 mg by mouth every 6 (six) hours as needed for moderate pain or headache.  Marland Kitchen aspirin 81 MG tablet Take 81 mg by mouth daily.  Marland Kitchen atorvastatin (LIPITOR) 40 MG tablet TAKE 1 TABLET (40 MG TOTAL) BY MOUTH DAILY.  . carvedilol (COREG) 12.5 MG tablet Take 1 tablet (12.5 mg total) by mouth 2 (two) times daily with a meal.  . dapagliflozin propanediol (FARXIGA) 10 MG TABS tablet Take 10 mg by mouth daily.  . Melatonin 10 MG CAPS Take 30 mg by mouth at bedtime as needed (sleep).  . metFORMIN (GLUCOPHAGE XR) 500 MG 24 hr tablet Take 2 tablets (1,000 mg total) by mouth in the morning and at bedtime.  . Multiple Vitamins-Minerals (MULTIVITAMIN ADULT) TABS Take 1 tablet by mouth daily with lunch.   . Omega-3 Fatty Acids (FISH OIL) 1000 MG CAPS Take 1,000 mg by mouth daily with lunch.   . Probiotic CAPS Take 2 capsules by mouth daily.  . sacubitril-valsartan (ENTRESTO) 49-51 MG Take 1 tablet by mouth 2 (two) times daily.  . sitaGLIPtin (JANUVIA) 100 MG tablet Take 1 tablet (100 mg total) by mouth daily.  Marland Kitchen spironolactone (ALDACTONE) 25 MG tablet Take 0.5 tablets (12.5 mg total) by mouth daily.  Marland Kitchen testosterone cypionate (DEPOTESTOTERONE CYPIONATE) 100 MG/ML injection Inject 80 mg into the muscle every Wednesday.   . triamcinolone cream (KENALOG) 0.1 % Apply 1 application topically 2 (two) times daily.     Allergies:   Lisinopril    Social History   Socioeconomic History  . Marital status: Married    Spouse name: Not on file  . Number of children: Not on file  . Years of education: Not on file  . Highest education level: Not on file  Occupational History  . Not on file  Tobacco Use  . Smoking status: Never Smoker  . Smokeless tobacco: Never Used  Substance and Sexual Activity  . Alcohol use: No  . Drug use: No  . Sexual activity: Not on file  Other Topics Concern  . Not on file  Social History Narrative  . Not on file   Social Determinants of Health   Financial Resource Strain: Not on file  Food Insecurity: Not on file  Transportation Needs: Not on file  Physical Activity: Not on file  Stress: Not on file  Social Connections: Not on file  Family History: The patient's family history is negative for Cancer, Colon cancer, Esophageal cancer, Rectal cancer, and Stomach cancer.  ROS:   Please see the history of present illness.    All other systems reviewed and are negative.  EKGs/Labs/Other Studies Reviewed:    The following studies were reviewed today: I discussed my findings with the patient at length.   Recent Labs: 10/18/2019: ALT 39; Hemoglobin 16.8; Magnesium 2.0; Platelets 187; TSH 2.150 06/26/2020: BUN 17; Creatinine, Ser 1.33; Potassium 4.2; Sodium 137  Recent Lipid Panel    Component Value Date/Time   CHOL 115 10/18/2019 0836   TRIG 92 10/18/2019 0836   HDL 37 (L) 10/18/2019 0836   CHOLHDL 3.1 10/18/2019 0836   CHOLHDL 3 10/16/2019 1414   VLDL 19.2 10/16/2019 1414   LDLCALC 60 10/18/2019 0836   LDLDIRECT 97.0 10/18/2017 0941    Physical Exam:    VS:  BP 108/70   Pulse 85   Ht 6\' 1"  (1.854 m)   Wt 196 lb 1.3 oz (88.9 kg)   SpO2 97%   BMI 25.87 kg/m     Wt Readings from Last 3 Encounters:  07/05/20 196 lb 1.3 oz (88.9 kg)  06/11/20 202 lb (91.6 kg)  05/20/20 213 lb 12.8 oz (97 kg)     GEN: Patient is in no acute distress HEENT: Normal NECK: No JVD; No carotid  bruits LYMPHATICS: No lymphadenopathy CARDIAC: Hear sounds regular, 2/6 systolic murmur at the apex. RESPIRATORY:  Clear to auscultation without rales, wheezing or rhonchi  ABDOMEN: Soft, non-tender, non-distended MUSCULOSKELETAL:  No edema; No deformity  SKIN: Warm and dry NEUROLOGIC:  Alert and oriented x 3 PSYCHIATRIC:  Normal affect   Signed, 05/22/20, MD  07/05/2020 1:57 PM    [facility]

## 2020-07-06 LAB — BASIC METABOLIC PANEL
BUN/Creatinine Ratio: 13 (ref 10–24)
BUN: 18 mg/dL (ref 8–27)
CO2: 20 mmol/L (ref 20–29)
Calcium: 9.9 mg/dL (ref 8.6–10.2)
Chloride: 102 mmol/L (ref 96–106)
Creatinine, Ser: 1.41 mg/dL — ABNORMAL HIGH (ref 0.76–1.27)
Glucose: 92 mg/dL (ref 65–99)
Potassium: 4.7 mmol/L (ref 3.5–5.2)
Sodium: 139 mmol/L (ref 134–144)
eGFR: 56 mL/min/{1.73_m2} — ABNORMAL LOW (ref >59)

## 2020-07-11 ENCOUNTER — Encounter (HOSPITAL_COMMUNITY): Payer: Self-pay | Admitting: Cardiology

## 2020-07-11 ENCOUNTER — Other Ambulatory Visit (HOSPITAL_COMMUNITY): Payer: Self-pay | Admitting: Cardiology

## 2020-07-11 ENCOUNTER — Other Ambulatory Visit: Payer: Self-pay

## 2020-07-11 ENCOUNTER — Ambulatory Visit (HOSPITAL_COMMUNITY)
Admission: RE | Admit: 2020-07-11 | Discharge: 2020-07-11 | Disposition: A | Discharge status: Home / Self Care | Payer: No Typology Code available for payment source | Source: Ambulatory Visit | Attending: Cardiology | Admitting: Cardiology

## 2020-07-11 ENCOUNTER — Ambulatory Visit (HOSPITAL_BASED_OUTPATIENT_CLINIC_OR_DEPARTMENT_OTHER)
Admission: RE | Admit: 2020-07-11 | Discharge: 2020-07-11 | Disposition: A | Discharge status: Home / Self Care | Payer: No Typology Code available for payment source | Source: Ambulatory Visit | Attending: Cardiology | Admitting: Cardiology

## 2020-07-11 VITALS — BP 100/60 | HR 85 | Wt 198.2 lb

## 2020-07-11 DIAGNOSIS — I358 Other nonrheumatic aortic valve disorders: Secondary | ICD-10-CM | POA: Diagnosis not present

## 2020-07-11 DIAGNOSIS — I5022 Chronic systolic (congestive) heart failure: Secondary | ICD-10-CM | POA: Diagnosis not present

## 2020-07-11 DIAGNOSIS — I5042 Chronic combined systolic (congestive) and diastolic (congestive) heart failure: Secondary | ICD-10-CM | POA: Diagnosis not present

## 2020-07-11 DIAGNOSIS — E785 Hyperlipidemia, unspecified: Secondary | ICD-10-CM | POA: Diagnosis not present

## 2020-07-11 DIAGNOSIS — I429 Cardiomyopathy, unspecified: Secondary | ICD-10-CM | POA: Diagnosis not present

## 2020-07-11 DIAGNOSIS — I2510 Atherosclerotic heart disease of native coronary artery without angina pectoris: Secondary | ICD-10-CM | POA: Insufficient documentation

## 2020-07-11 DIAGNOSIS — E119 Type 2 diabetes mellitus without complications: Secondary | ICD-10-CM | POA: Insufficient documentation

## 2020-07-11 DIAGNOSIS — I110 Hypertensive heart disease with heart failure: Secondary | ICD-10-CM | POA: Diagnosis present

## 2020-07-11 LAB — ECHOCARDIOGRAM COMPLETE
Area-P 1/2: 4.83 cm2
Calc EF: 35.1 %
S' Lateral: 5.1 cm
Single Plane A2C EF: 40.5 %
Single Plane A4C EF: 30.8 %

## 2020-07-11 MED ORDER — SPIRONOLACTONE 25 MG PO TABS: 25.0000 mg | ORAL_TABLET | Freq: Every day | ORAL | 3 refills | Status: DC

## 2020-07-11 NOTE — Progress Notes (Signed)
  Echocardiogram 2D Echocardiogram has been performed.  Michael Guerra 07/11/2020, 3:12 PM

## 2020-07-11 NOTE — Patient Instructions (Signed)
Increase Spironolactone to 25 mg Daily at night  Your physician recommends that you return for lab work in: 1 week  Please follow up with our heart failure pharmacist in 1 month  Please call our office in June to schedule your echocardiogram and follow up appointment  If you have any questions or concerns before your next appointment please send [place] a message through [place] or call our office at [phone].    TO LEAVE A MESSAGE FOR THE NURSE SELECT OPTION 2, PLEASE LEAVE A MESSAGE INCLUDING: . YOUR NAME . DATE OF BIRTH . CALL BACK NUMBER . REASON FOR CALL**this is important as we prioritize the call backs  YOU WILL RECEIVE A CALL BACK THE SAME DAY AS LONG AS YOU CALL BEFORE 4:00 PM  At the [facility], you and your health needs are our priority. As part of our continuing mission to provide you with exceptional heart care, we have created designated Provider Care Teams. These Care Teams include your primary Cardiologist (physician) and Advanced Practice Providers (APPs- Physician Assistants and Nurse Practitioners) who all work together to provide you with the care you need, when you need it.   You may see any of the following providers on your designated Care Team at your next follow up: Marland Kitchen Dr Arvilla Meres . Dr Marca Ancona . Dr Thornell Mule . Tonye Becket, NP . Robbie Lis, PA . Shanda Bumps [place],NP . Karle Plumber, PharmD   Please be sure to bring in all your medications bottles to every appointment.

## 2020-07-12 MED FILL — SPIRONOLACTONE 25 MG TABLET: 25 | 90 days supply | Qty: 90 | Fill #0

## 2020-07-12 MED FILL — TESTOSTERON CYP 1,000 MG/10: 100 | 28 days supply | Qty: 10 | Fill #1

## 2020-07-12 MED FILL — BD 3 ML SYRINGE 18GX1-1/2: 18G X 1-1/2 | 28 days supply | Qty: 4 | Fill #4

## 2020-07-13 NOTE — Progress Notes (Signed)
PCP: Sharlene Dory, DO Cardiology: Dr. Tomie China HF Cardiology: Dr. Shirlee Latch  60 y.o. with history of CKD stage 3, HTN, and diabetes was referred for evaluation of CHF by Dr. Tomie China.  Patient has had diabetes and HTN for about 10-15 years per his report.  He has CKD stage 3 in setting of nephrectomy done in childhood due to trauma, follows with nephrology.  He developed atypical chest pain in 2/21 and had Cardiolite done.  This showed EF 23% with fixed inferior defect.  Echo showed EF 35-40%, mild LV dilation, no LVH, normal RV.  He then had LHC done, showing OM1 65% stenosis, distal LAD 70% stenosis.  Cardiomyopathy was thought to be out of proportion to CAD. No recent viral-type infection symptoms.  No strong family history of CHF.    Cardiac MRI in 12/21 was a difficult study, EF 15% with diffuse hypokinesis, mildly decreased RV systolic function, no LGE.    He had mildly elevated K on a prior BMET and spironolactone was stopped.   Echo was done today and reviewed, EF 30-35%, diffuse hypokinesis, normal RV, normal IVC.   He returns for followup of CHF. 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  No lightheaded spells (has been drinking more fluid due to prior orthostatic symptoms).  No chest pain.  No exertional dyspnea, can walk up stairs without problems.    Labs (6/21): LDL 60, K 4.6, creatinine 1.5 Labs (1/22): K 4.5, creatinine 1.46 Labs (3/22): K 4.7, creatinine 1.4  PMH: 1. CKD stage 3: H/o nephrectomy in childhood post-trauma.  2. HTN - Renal artery dopplers (9/21): No significant stenosis.  3. Hyperlipidemia 4. Type 2 diabetes 5. CAD: LHC (2/21) with OM1 65% stenosis, distal LAD 70% stenosis.  6. Chronic systolic CHF: Nonischemic cardiomyopathy, cardiomyopathy out of proportion to CAD.  - Echo (2/21) with EF 35-40%, mild LV dilation, no LVH, normal RV.  - Cardiac MRI (12/21): Difficult study, EF 15% with diffuse hypokinesis, mildly  decreased RV systolic function, no LGE.   - Echo (3/22): EF 30-35%, diffuse hypokinesis, normal RV, normal IVC.   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   FH: Mother with diabetes, possible CHF.   ROS: All systems reviewed and negative except as per HPI.   Current Outpatient Medications  Medication Sig Dispense Refill  . acetaminophen (TYLENOL) 500 MG tablet Take 1,000 mg by mouth every 6 (six) hours as needed for moderate pain or headache.    Marland Kitchen aspirin 81 MG tablet Take 81 mg by mouth daily.    Marland Kitchen atorvastatin (LIPITOR) 40 MG tablet TAKE 1 TABLET (40 MG TOTAL) BY MOUTH DAILY. 90 tablet 3  . carvedilol (COREG) 12.5 MG tablet Take 1 tablet (12.5 mg total) by mouth 2 (two) times daily with a meal. 180 tablet 3  . dapagliflozin propanediol (FARXIGA) 10 MG TABS tablet Take 10 mg by mouth daily.    . Melatonin 10 MG CAPS Take 30 mg by mouth at bedtime as needed (sleep).    . metFORMIN (GLUCOPHAGE XR) 500 MG 24 hr tablet Take 2 tablets (1,000 mg total) by mouth in the morning and at bedtime. 360 tablet 2  . Multiple Vitamins-Minerals (MULTIVITAMIN ADULT) TABS Take 1 tablet by mouth daily with lunch.     . Omega-3 Fatty Acids (FISH OIL) 1000 MG CAPS Take 1,000 mg by mouth daily with lunch.     Marland Kitchen  Probiotic CAPS Take 2 capsules by mouth daily.    . sacubitril-valsartan (ENTRESTO) 49-51 MG Take 1 tablet by mouth 2 (two) times daily. 180 tablet 3  . sitaGLIPtin (JANUVIA) 100 MG tablet Take 1 tablet (100 mg total) by mouth daily. 30 tablet 2  . testosterone cypionate (DEPOTESTOTERONE CYPIONATE) 100 MG/ML injection Inject 80 mg into the muscle every Wednesday.     . triamcinolone cream (KENALOG) 0.1 % Apply 1 application topically 2 (two) times daily. 30 g 0  . spironolactone (ALDACTONE) 25 MG tablet Take 1 tablet (25 mg total) by mouth at bedtime. 90 tablet 3   No current facility-administered medications for this encounter.   BP 100/60   Pulse 85   Wt  89.9 kg (198 lb 3.2 oz)   SpO2 96%   BMI 26.15 kg/m  General: NAD Neck: No JVD, no thyromegaly or thyroid nodule.  Lungs: Clear to auscultation bilaterally with normal respiratory effort. CV: Nondisplaced PMI.  Heart regular S1/S2, no S3/S4, no murmur.  No peripheral edema.  No carotid bruit.  Normal pedal pulses.  Abdomen: Soft, nontender, no hepatosplenomegaly, no distention.  Skin: Intact without lesions or rashes.  Neurologic: Alert and oriented x 3.  Psych: Normal affect. Extremities: No clubbing or cyanosis.  HEENT: Normal.   Assessment/Plan: 1. Chronic systolic CHF: Echo in 2/21 with EF 35-40%.  LHC in 2/21 with moderate coronary disease, but coronary disease appears out of proportion to the severity of the cardiomyopathy.  It is possible that long-standing HTN and DM have triggered the cardiomyopathy.  Cardiac MRI in 12/21 was a difficult study, showed EF 15% with diffuse hypokinesis, mildly decreased RV systolic function, no LGE.  No evidence for infiltrative disease or myocarditis. Echo today showed EF 30-35%, normal RV. NYHA class I symptoms, not volume overloaded on exam.  - Continue Entresto 49/51 bid.  - Continue Coreg 12.5 mg bid.  - K has been ok recently, increase spironolactone to 25 mg daily.  Can use Veltassa if needed to allow him to stay on spironolactone. BMET today and again in 10 days.  - Continue Farxiga 10 mg daily.  - Repeat echo in 4 months after further medication titration.  If EF remains low, will need to consider ICD though with minimal symptoms and nonischemic etiology, benefit may not be as high.  Not CRT candidate with narrow QRS.   2. CAD: Moderate disease, no intervention on 2/21 cath. No chest pain.  - Continue ASA 81 daily.  - Continue atorvastatin, good lipids in 6/21.  3. HTN: BP controlled. 4. CKD stage 3a: Solitary kidney.   Follow carefully.  - He is on [place].   Followup with HF pharmacist in 1 month for med titration. See me in 4 months  with echo.  Marca Ancona 07/13/2020

## 2020-07-21 ENCOUNTER — Other Ambulatory Visit (HOSPITAL_BASED_OUTPATIENT_CLINIC_OR_DEPARTMENT_OTHER): Payer: Self-pay

## 2020-07-22 ENCOUNTER — Ambulatory Visit (HOSPITAL_COMMUNITY)
Admission: RE | Admit: 2020-07-22 | Discharge: 2020-07-22 | Disposition: A | Discharge status: Home / Self Care | Payer: No Typology Code available for payment source | Source: Ambulatory Visit | Attending: Internal Medicine | Admitting: Internal Medicine

## 2020-07-22 ENCOUNTER — Other Ambulatory Visit (HOSPITAL_BASED_OUTPATIENT_CLINIC_OR_DEPARTMENT_OTHER): Payer: Self-pay

## 2020-07-22 ENCOUNTER — Other Ambulatory Visit: Payer: Self-pay | Admitting: Family Medicine

## 2020-07-22 ENCOUNTER — Other Ambulatory Visit (HOSPITAL_COMMUNITY): Payer: No Typology Code available for payment source

## 2020-07-22 ENCOUNTER — Other Ambulatory Visit: Payer: Self-pay

## 2020-07-22 DIAGNOSIS — I429 Cardiomyopathy, unspecified: Secondary | ICD-10-CM | POA: Insufficient documentation

## 2020-07-22 LAB — BASIC METABOLIC PANEL
Anion gap: 8 (ref 5–15)
BUN: 15 mg/dL (ref 8–23)
CO2: 25 mmol/L (ref 22–32)
Calcium: 9.4 mg/dL (ref 8.9–10.3)
Chloride: 104 mmol/L (ref 98–111)
Creatinine, Ser: 1.38 mg/dL — ABNORMAL HIGH (ref 0.61–1.24)
GFR, Estimated: 58 mL/min — ABNORMAL LOW (ref >60)
Glucose, Bld: 111 mg/dL — ABNORMAL HIGH (ref 70–99)
Potassium: 4.4 mmol/L (ref 3.5–5.1)
Sodium: 137 mmol/L (ref 135–145)

## 2020-07-22 MED ORDER — DAPAGLIFLOZIN PROPANEDIOL 10 MG PO TABS
ORAL_TABLET | Freq: Every day | ORAL | 5 refills | Status: DC
  Filled 2020-07-22 – 2020-07-30 (×2): qty 90, 90d supply, fill #0

## 2020-07-30 ENCOUNTER — Other Ambulatory Visit (HOSPITAL_BASED_OUTPATIENT_CLINIC_OR_DEPARTMENT_OTHER): Payer: Self-pay

## 2020-07-30 MED FILL — Sitagliptin Phosphate Tab 100 MG (Base Equiv): ORAL | 30 days supply | Qty: 30 | Fill #0 | Status: CN

## 2020-07-31 ENCOUNTER — Other Ambulatory Visit (HOSPITAL_COMMUNITY): Payer: Self-pay

## 2020-07-31 ENCOUNTER — Other Ambulatory Visit (HOSPITAL_BASED_OUTPATIENT_CLINIC_OR_DEPARTMENT_OTHER): Payer: Self-pay

## 2020-07-31 MED FILL — Sitagliptin Phosphate Tab 100 MG (Base Equiv): ORAL | 30 days supply | Qty: 30 | Fill #0 | Status: AC

## 2020-08-05 ENCOUNTER — Other Ambulatory Visit (HOSPITAL_BASED_OUTPATIENT_CLINIC_OR_DEPARTMENT_OTHER): Payer: Self-pay

## 2020-08-05 MED FILL — Syringe/Needle (Disp) 3 ML 18 x 1-1/2": 28 days supply | Qty: 4 | Fill #0 | Status: AC

## 2020-08-05 MED FILL — Atorvastatin Calcium Tab 40 MG (Base Equivalent): ORAL | 90 days supply | Qty: 90 | Fill #0 | Status: AC

## 2020-08-12 NOTE — Progress Notes (Incomplete)
***In Progress*** PCP: Sharlene Dory, DO Cardiology: Dr. Tomie China HF Cardiology: Dr. Shirlee Latch  HPI:  60 y.o. with history of CKD stage 3, HTN, and diabetes was referred for evaluation of CHF by Dr. Tomie China.  Patient has had diabetes and HTN for about 10-15 years per his report.  He has CKD stage 3 in setting of nephrectomy done in childhood due to trauma, follows with nephrology.  He developed atypical chest pain in 05/2019 and had Cardiolite done.  This showed EF 23% with fixed inferior defect.  Echo showed EF 35-40%, mild LV dilation, no LVH, normal RV.  He then had LHC done, showing OM1 65% stenosis, distal LAD 70% stenosis.  Cardiomyopathy was thought to be out of proportion to CAD. No recent viral-type infection symptoms.  No strong family history of CHF.    Cardiac MRI in 03/2020 was a difficult study, EF 15% with diffuse hypokinesis, mildly decreased RV systolic function, no LGE.  He had mildly elevated K on a prior BMET and spironolactone was stopped.   Echo was done 07/11/20, EF 30-35%, diffuse hypokinesis, normal RV, normal IVC.  He returned for follow-up of CHF on 07/11/20. 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  No lightheaded spells (has been drinking more fluid due to prior orthostatic symptoms).  No chest pain.  No exertional dyspnea, could walk up stairs without problems.  Today he returns to HF clinic for pharmacist medication titration. At last visit with MD, spironolactone was increased to 25 mg BID. ***    Overall feeling ***. Dizziness, lightheadedness, fatigue:  Chest pain or palpitations:  How is your breathing?: *** SOB: Able to complete all ADLs. Activity level ***  Weight at home pounds. Takes furosemide/torsemide/bumex *** mg *** daily.  LEE PND/Orthopnea  Appetite *** Low-salt diet:   Physical Exam Cost/affordability of meds   HF Medications: Carvedilol 12.5 mg BID Entresto 49/51 mg  BID Spironolactone 25 mg daily Farxiga 10 mg daily  Has the patient been experiencing any side effects to the medications prescribed?  {YES NO:22349}  Does the patient have any problems obtaining medications due to transportation or finances?   {YES NO:22349}  Understanding of regimen: {excellent/good/fair/poor:19665} Understanding of indications: {excellent/good/fair/poor:19665} Potential of compliance: {excellent/good/fair/poor:19665} Patient understands to avoid NSAIDs. Patient understands to avoid decongestants.    Pertinent Lab Values: . Serum creatinine ***, BUN ***, Potassium ***, Sodium ***, BNP ***, Magnesium ***, Digoxin ***   Vital Signs: . Weight: *** (last clinic weight: ***) . Blood pressure: ***  . Heart rate: ***   Assessment/Plan: 1. Chronic systolic CHF: Echo in 05/2019 with EF 35-40%.  LHC in 05/2019 with moderate coronary disease, but coronary disease appears out of proportion to the severity of the cardiomyopathy.  It is possible that long-standing HTN and DM have triggered the cardiomyopathy.  Cardiac MRI in 03/2020 was a difficult study, showed EF 15% with diffuse hypokinesis, mildly decreased RV systolic function, no LGE.  No evidence for infiltrative disease or myocarditis. Echo today showed EF 30-35%, normal RV. NYHA class I symptoms, not volume overloaded on exam. - Continue carvedilol 12.5 mg BID. - Continue Entresto 49/51 mg BID.  - Continue spironolactone 25 mg daily.  Can use Veltassa if needed to allow him to stay on spironolactone. BMET today and again in 10 days. ***  - Continue Farxiga 10 mg daily.  - Repeat echo in 4 months after further medication titration.  If EF remains low, will  need to consider ICD though with minimal symptoms and nonischemic etiology, benefit may not be as high.  Not CRT candidate with narrow QRS.   2. CAD: Moderate disease, no intervention on 2/21 cath. No chest pain.  - Continue ASA 81 daily.  - Continue atorvastatin, good  lipids in 6/21.  3. HTN: BP controlled. 4. CKD stage 3a: Solitary kidney.   Follow carefully.  - He is on [place].   Followup with HF pharmacist in 1 month for med titration. See me in 4 months with echo.    Lucina Mellow (PharmD Candidate 2022)  Karle Plumber, PharmD, BCPS, BCCP, CPP [facility] Pharmacist [phone]

## 2020-08-15 ENCOUNTER — Inpatient Hospital Stay (HOSPITAL_COMMUNITY)
Admission: RE | Admit: 2020-08-15 | Discharge: 2020-08-15 | Disposition: A | Discharge status: Home / Self Care | Payer: No Typology Code available for payment source | Source: Ambulatory Visit

## 2020-08-16 ENCOUNTER — Ambulatory Visit: Payer: No Typology Code available for payment source | Admitting: Family Medicine

## 2020-08-19 ENCOUNTER — Encounter: Payer: Self-pay | Admitting: Family Medicine

## 2020-08-19 ENCOUNTER — Other Ambulatory Visit: Payer: Self-pay

## 2020-08-19 ENCOUNTER — Ambulatory Visit (INDEPENDENT_AMBULATORY_CARE_PROVIDER_SITE_OTHER): Payer: No Typology Code available for payment source | Admitting: Family Medicine

## 2020-08-19 VITALS — BP 108/62 | HR 92 | Temp 98.3°F | Ht 73.0 in | Wt 196.2 lb

## 2020-08-19 DIAGNOSIS — I10 Essential (primary) hypertension: Secondary | ICD-10-CM

## 2020-08-19 DIAGNOSIS — E1165 Type 2 diabetes mellitus with hyperglycemia: Secondary | ICD-10-CM | POA: Diagnosis not present

## 2020-08-19 NOTE — Progress Notes (Signed)
Subjective:   Chief Complaint  Patient presents with  . Follow-up    Michael Guerra is a 60 y.o. male here for follow-up of diabetes.   Michael Guerra's self monitored glucose range is 80-110's.  Patient denies hypoglycemic reactions. He checks his glucose levels 1 time(s) per day. Patient does not require insulin.   Medications include: Metformin XR 1000 mg bid, Januvia 100 mg/d, Farxiga 10 mg/d.  Diet is healthy.  Exercise: active at work, walking, wt resistance No Cp or SOB.   Hypertension Patient presents for hypertension follow up. He does monitor home blood pressures. Blood pressures ranging on average from 110's/60-70's. He is compliant with medications- Coreg 12.5 mg bid, Entresto, Aldactone 25 mg/d. Patient has these side effects of medication: none Diet/exercise as above.    Past Medical History:  Diagnosis Date  . CAD (coronary artery disease) 06/08/2019  . Cardiomyopathy (HCC) 05/23/2019  . Cataract   . Chest discomfort 05/12/2019  . CHF (congestive heart failure) (HCC)   . Chronic kidney disease    one kidney that is healthy  . Chronic left shoulder pain 02/05/2020  . Diabetes mellitus due to underlying condition with unspecified complications (HCC) 05/12/2019  . Essential hypertension 05/12/2019  . Foot sprain, left, initial encounter 06/22/2019  . LIVER FUNCTION TESTS, ABNORMAL 05/21/2006   Qualifier: Diagnosis of  By: Maple Hudson MD, Riki Rusk    . Mixed dyslipidemia 05/12/2019  . Other specified abnormal findings of blood chemistry 05/20/2011   Formatting of this note might be different from the original. Elevated liver function tests 10/1 IMO update  . Rectal polyp 05/20/2011   Formatting of this note might be different from the original. Noted march 2011 fu in march 2016  . Renal insufficiency 05/23/2019  . Skin lesion of chest wall 05/20/2011   Formatting of this note might be different from the original. Mid chest (prob seb/epidermoid cyst)  . Solitary kidney   . Type 2 diabetes  mellitus with hyperglycemia, without long-term current use of insulin (HCC) 02/17/2006   Qualifier: Diagnosis of  By: Maple Hudson MD, Riki Rusk       Related testing: Retinal exam: Done Pneumovax: done  Objective:  BP 108/62 (BP Location: Left Arm, Patient Position: Sitting, Cuff Size: Normal)   Pulse 92   Temp 98.3 F (36.8 C) (Oral)   Ht 6\' 1"  (1.854 m)   Wt 196 lb 4 oz (89 kg)   SpO2 97%   BMI 25.89 kg/m  General:  Well developed, well nourished, in no apparent distress Skin:  Warm, no pallor or diaphoresis Head:  Normocephalic, atraumatic Eyes:  Pupils equal and round, sclera anicteric without injection  Lungs:  CTAB, no access msc use Cardio:  RRR, no bruits, no LE edema Musculoskeletal:  Symmetrical muscle groups noted without atrophy or deformity Neuro:  Sensation intact to pinprick on feet Psych: Age appropriate judgment and insight  Assessment:   Type 2 diabetes mellitus with hyperglycemia, without long-term current use of insulin (HCC) - Plan: Comprehensive metabolic panel, Lipid panel, Hemoglobin A1c  Essential hypertension   Plan:   1. Home readings sound great. If A1c 6.7 or lower, will stop Januvia. Cont to monitor sugars. Counseled on diet and exercise. Come back for fasting labs.  2. Cont Coreg 12.5 mg bid, Aldactone 25 mg/d, Entresto bid. Does not need to monitor BP at home.  F/u in 3-6 mo pending above. The patient voiced understanding and agreement to the plan.  [place], DO 08/19/20 3:29 PM

## 2020-08-19 NOTE — Patient Instructions (Signed)
Give [place] 2-3 business days to get the results of your labs back.   Keep the diet clean and stay active.  Keep checking your sugars at home routinely for now.  I hope we can take you off of your Januvia  Let us know if you need anything.

## 2020-09-02 ENCOUNTER — Other Ambulatory Visit (INDEPENDENT_AMBULATORY_CARE_PROVIDER_SITE_OTHER): Payer: No Typology Code available for payment source

## 2020-09-02 ENCOUNTER — Other Ambulatory Visit: Payer: Self-pay

## 2020-09-02 DIAGNOSIS — E1165 Type 2 diabetes mellitus with hyperglycemia: Secondary | ICD-10-CM | POA: Diagnosis not present

## 2020-09-02 LAB — COMPREHENSIVE METABOLIC PANEL
ALT: 31 U/L (ref 0–53)
AST: 40 U/L — ABNORMAL HIGH (ref 0–37)
Albumin: 4.3 g/dL (ref 3.5–5.2)
Alkaline Phosphatase: 59 U/L (ref 39–117)
BUN: 18 mg/dL (ref 6–23)
CO2: 26 mEq/L (ref 19–32)
Calcium: 9.1 mg/dL (ref 8.4–10.5)
Chloride: 103 mEq/L (ref 96–112)
Creatinine, Ser: 1.45 mg/dL (ref 0.40–1.50)
GFR: 51.72 mL/min — ABNORMAL LOW (ref >60.00)
Glucose, Bld: 116 mg/dL — ABNORMAL HIGH (ref 70–99)
Potassium: 4.1 mEq/L (ref 3.5–5.1)
Sodium: 137 mEq/L (ref 135–145)
Total Bilirubin: 1.8 mg/dL — ABNORMAL HIGH (ref 0.2–1.2)
Total Protein: 6.7 g/dL (ref 6.0–8.3)

## 2020-09-02 LAB — HEMOGLOBIN A1C: Hgb A1c MFr Bld: 6.5 % (ref 4.6–6.5)

## 2020-09-02 LAB — LIPID PANEL
Cholesterol: 84 mg/dL (ref 0–200)
HDL: 36.1 mg/dL — ABNORMAL LOW (ref >39.00)
LDL Cholesterol: 33 mg/dL (ref 0–99)
NonHDL: 48.29
Total CHOL/HDL Ratio: 2
Triglycerides: 78 mg/dL (ref 0.0–149.0)
VLDL: 15.6 mg/dL (ref 0.0–40.0)

## 2020-09-05 ENCOUNTER — Other Ambulatory Visit (HOSPITAL_COMMUNITY): Payer: Self-pay

## 2020-09-05 MED FILL — Needle (Disp) 22 x 1-1/2": 4 days supply | Qty: 4 | Fill #0 | Status: CN

## 2020-09-05 MED FILL — Syringe/Needle (Disp) 3 ML 18 x 1-1/2": 28 days supply | Qty: 4 | Fill #1 | Status: CN

## 2020-09-11 ENCOUNTER — Other Ambulatory Visit (HOSPITAL_COMMUNITY): Payer: Self-pay

## 2020-09-11 ENCOUNTER — Other Ambulatory Visit: Payer: Self-pay | Admitting: Family Medicine

## 2020-09-11 ENCOUNTER — Other Ambulatory Visit (HOSPITAL_BASED_OUTPATIENT_CLINIC_OR_DEPARTMENT_OTHER): Payer: Self-pay

## 2020-09-11 MED ORDER — SITAGLIPTIN PHOSPHATE 100 MG PO TABS
100.0000 mg | ORAL_TABLET | Freq: Every day | ORAL | 1 refills | Status: DC
  Filled 2020-09-11 (×2): qty 30, 30d supply, fill #0
  Filled 2020-10-13: qty 30, 30d supply, fill #1
  Filled 2020-11-18: qty 30, 30d supply, fill #2

## 2020-09-16 NOTE — Progress Notes (Signed)
PCP: Sharlene Dory, DO Cardiology: Dr. Tomie China HF Cardiology: Dr. Shirlee Latch  60 y.o. with history of CKD stage 3, HTN, and diabetes was referred for evaluation of CHF by Dr. Tomie China.  Patient has had diabetes and HTN for about 10-15 years per his report.  He has CKD stage 3 in setting of nephrectomy done in childhood due to trauma, follows with nephrology.  He developed atypical chest pain in 2/21 and had Cardiolite done.  This showed EF 23% with fixed inferior defect.  Echo showed EF 35-40%, mild LV dilation, no LVH, normal RV.  He then had LHC done, showing OM1 65% stenosis, distal LAD 70% stenosis.  Cardiomyopathy was thought to be out of proportion to CAD. No recent viral-type infection symptoms.  No strong family history of CHF.    Cardiac MRI in 12/21 was a difficult study, EF 15% with diffuse hypokinesis, mildly decreased RV systolic function, no LGE.    He had mildly elevated K on a prior BMET and spironolactone was stopped.   06/2020 Echo  EF 30-35%, diffuse hypokinesis, normal RV, normal IVC.   Saw Dr Shirlee Latch in March and spiro was increased to 25 mg daily.   Today he returns for HF follow up.Overall feeling fine. Having some dizziness today.  Denies SOB/PND/Orthopnea. Appetite ok. No fever or chills. Weight at home  pounds. Taking all medications but did not take meds prior to visit. Plan to take when he goes home.  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    Labs (6/21): LDL 60, K 4.6, creatinine 1.5 Labs (1/22): K 4.5, creatinine 1.46 Labs (3/22): K 4.7, creatinine 1.4 Labs 09/02/20: K 4.1 Creatinine 1.45   PMH: 1. CKD stage 3: H/o nephrectomy in childhood post-trauma.  2. HTN - Renal artery dopplers (9/21): No significant stenosis.  3. Hyperlipidemia 4. Type 2 diabetes 5. CAD: LHC (2/21) with OM1 65% stenosis, distal LAD 70% stenosis.  6. Chronic systolic CHF: Nonischemic cardiomyopathy, cardiomyopathy out of proportion to CAD.  - Echo (2/21) with EF  35-40%, mild LV dilation, no LVH, normal RV.  - Cardiac MRI (12/21): Difficult study, EF 15% with diffuse hypokinesis, mildly decreased RV systolic function, no LGE.   - Echo (3/22): EF 30-35%, diffuse hypokinesis, normal RV, normal IVC.   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   FH: Mother with diabetes, possible CHF.   ROS: All systems reviewed and negative except as per HPI.   Current Outpatient Medications  Medication Sig Dispense Refill  . acetaminophen (TYLENOL) 500 MG tablet Take 1,000 mg by mouth every 6 (six) hours as needed for moderate pain or headache.    Marland Kitchen aspirin 81 MG tablet Take 81 mg by mouth daily.    Marland Kitchen atorvastatin (LIPITOR) 40 MG tablet TAKE 1 TABLET BY MOUTH ONCE DAILY 90 tablet 3  . carvedilol (COREG) 12.5 MG tablet TAKE 1 TABLET (12.5 MG TOTAL) BY MOUTH 2 (TWO) TIMES DAILY WITH A MEAL. 180 tablet 3  . dapagliflozin propanediol (FARXIGA) 10 MG TABS tablet TAKE 1 TABLET BY MOUTH ONCE DAILY 90 tablet 5  . Melatonin 10 MG CAPS Take 30 mg by mouth at bedtime as needed (sleep).    . metFORMIN (GLUCOPHAGE-XR) 500 MG 24 hr tablet TAKE 2 TABLETS (1,000 MG TOTAL) BY MOUTH IN THE MORNING AND AT BEDTIME. 360 tablet 1  . Multiple Vitamins-Minerals (MULTIVITAMIN ADULT) TABS Take 1 tablet by mouth daily with lunch.     Marland Kitchen  mupirocin ointment (BACTROBAN) 2 % APPLY TO AFFECTED AREAS THREE TIMES A DAY FOR 1 WEEK 22 g 0  . NEEDLE, DISP, 22 G 22G X 1-1/2" MISC USE FOR WEEKLY INJECTION 4 each 12  . Omega-3 Fatty Acids (FISH OIL) 1000 MG CAPS Take 1,000 mg by mouth daily with lunch.     . Probiotic CAPS Take 2 capsules by mouth daily.    . sacubitril-valsartan (ENTRESTO) 49-51 MG TAKE 1 TABLET BY MOUTH 2 (TWO) TIMES DAILY. *DISCONTINUE AMLODIPINE* 180 tablet 3  . sitaGLIPtin (JANUVIA) 100 MG tablet Take 1 tablet (100 mg total) by mouth daily. 90 tablet 1  . spironolactone (ALDACTONE) 25 MG tablet TAKE 1 TABLET BY MOUTH EVERY NIGHT AT BEDTIME 90  tablet 3  . SYRINGE-NEEDLE, DISP, 3 ML 18G X 1-1/2" 3 ML MISC USE WEEKLY FOR DRAWING 4 each 12  . testosterone cypionate (DEPOTESTOTERONE CYPIONATE) 100 MG/ML injection Inject 80 mg into the muscle every Wednesday.     . testosterone cypionate (DEPOTESTOTERONE CYPIONATE) 100 MG/ML injection INJECT 1 ML INTRAMUSCULARLY EVERY WEEK 10 mL 1  . triamcinolone ointment (KENALOG) 0.1 % APPLY TO AFFECTED AREAS 2 TIMES A DAY 80 g 1   No current facility-administered medications for this encounter.   BP 136/90   Pulse 83   Ht 6\' 1"  (1.854 m)   Wt 90 kg   SpO2 97%   BMI 26.18 kg/m   Wt Readings from Last 3 Encounters:  09/17/20 90 kg (198 lb 6.4 oz)  08/19/20 89 kg (196 lb 4 oz)  07/11/20 89.9 kg (198 lb 3.2 oz)  Sitting 136/90 Standing 138/90   General:  Well appearing. No resp difficulty HEENT: normal Neck: supple. no JVD. Carotids 2+ bilat; no bruits. No lymphadenopathy or thryomegaly appreciated. Cor: PMI nondisplaced. Regular rate & rhythm. No rubs, gallops or murmurs. Lungs: clear Abdomen: soft, nontender, nondistended. No hepatosplenomegaly. No bruits or masses. Good bowel sounds. Extremities: no cyanosis, clubbing, rash, edema Neuro: alert & orientedx3, cranial nerves grossly intact. moves all 4 extremities w/o difficulty. Affect pleasant  Assessment/Plan: 1. Chronic systolic CHF: Echo in 2/21 with EF 35-40%.  LHC in 2/21 with moderate coronary disease, but coronary disease appears out of proportion to the severity of the cardiomyopathy.  It is possible that long-standing HTN and DM have triggered the cardiomyopathy.  Cardiac MRI in 12/21 was a difficult study, showed EF 15% with diffuse hypokinesis, mildly decreased RV systolic function, no LGE.  No evidence for infiltrative disease or myocarditis. 06/2020  Echo showed EF 30-35%, normal RV.  NYHA I . Volume status stable. Does not need scheduled diuretics. - No up titration today given he has not had meds.  - Continue Entresto 49/51  bid.  - Continue Coreg 12.5 mg bid.  -Continue spironolactone to 25 mg daily.  Can use [place] if needed to allow him to stay on spironolactone.  - Continue Farxiga 10 mg daily.  - Repeat ECHO next visit   If EF remains low, will need to consider ICD though with minimal symptoms and nonischemic etiology, benefit may not be as high.  Not CRT candidate with narrow QRS.   2. CAD: Moderate disease, no intervention on 2/21 cath. No chest pain.  - Continue ASA 81 daily.  - Continue atorvastatin, good lipids in 6/21.  3. HTN: Not bad but has not had morning meds. No change for now.  4. CKD stage 3a: Solitary kidney.   - He is on 7/21.   Follow up in 2  months with Dr Shirlee Latch and an ECHO.    Michael Valvano NP-C  09/17/2020

## 2020-09-17 ENCOUNTER — Encounter (HOSPITAL_COMMUNITY): Payer: Self-pay

## 2020-09-17 ENCOUNTER — Other Ambulatory Visit: Payer: Self-pay

## 2020-09-17 ENCOUNTER — Ambulatory Visit (HOSPITAL_COMMUNITY)
Admission: RE | Admit: 2020-09-17 | Discharge: 2020-09-17 | Disposition: A | Discharge status: Home / Self Care | Payer: No Typology Code available for payment source | Source: Ambulatory Visit | Attending: Cardiology | Admitting: Cardiology

## 2020-09-17 VITALS — BP 136/90 | HR 83 | Ht 73.0 in | Wt 198.4 lb

## 2020-09-17 DIAGNOSIS — I2510 Atherosclerotic heart disease of native coronary artery without angina pectoris: Secondary | ICD-10-CM | POA: Diagnosis not present

## 2020-09-17 DIAGNOSIS — E1122 Type 2 diabetes mellitus with diabetic chronic kidney disease: Secondary | ICD-10-CM | POA: Insufficient documentation

## 2020-09-17 DIAGNOSIS — I509 Heart failure, unspecified: Secondary | ICD-10-CM

## 2020-09-17 DIAGNOSIS — I130 Hypertensive heart and chronic kidney disease with heart failure and stage 1 through stage 4 chronic kidney disease, or unspecified chronic kidney disease: Secondary | ICD-10-CM | POA: Diagnosis not present

## 2020-09-17 DIAGNOSIS — I5022 Chronic systolic (congestive) heart failure: Secondary | ICD-10-CM | POA: Insufficient documentation

## 2020-09-17 DIAGNOSIS — Z7982 Long term (current) use of aspirin: Secondary | ICD-10-CM | POA: Diagnosis not present

## 2020-09-17 DIAGNOSIS — N1831 Chronic kidney disease, stage 3a: Secondary | ICD-10-CM | POA: Diagnosis not present

## 2020-09-17 DIAGNOSIS — Z833 Family history of diabetes mellitus: Secondary | ICD-10-CM | POA: Insufficient documentation

## 2020-09-17 DIAGNOSIS — Z7984 Long term (current) use of oral hypoglycemic drugs: Secondary | ICD-10-CM | POA: Insufficient documentation

## 2020-09-17 DIAGNOSIS — Z79899 Other long term (current) drug therapy: Secondary | ICD-10-CM | POA: Insufficient documentation

## 2020-09-17 DIAGNOSIS — I10 Essential (primary) hypertension: Secondary | ICD-10-CM | POA: Diagnosis not present

## 2020-09-17 DIAGNOSIS — E785 Hyperlipidemia, unspecified: Secondary | ICD-10-CM | POA: Insufficient documentation

## 2020-09-17 DIAGNOSIS — Z905 Acquired absence of kidney: Secondary | ICD-10-CM | POA: Diagnosis not present

## 2020-09-17 NOTE — Patient Instructions (Signed)
It was great to see you today! No medication changes are needed at this time.   Your physician recommends that you schedule a follow-up appointment in: 2-3 months with Dr Shirlee Latch and echo  Your physician has requested that you have an echocardiogram. Echocardiography is a painless test that uses sound waves to create images of your heart. It provides your doctor with information about the size and shape of your heart and how well your heart's chambers and valves are working. This procedure takes approximately one hour. There are no restrictions for this procedure.   Do the following things EVERYDAY: 1) Weigh yourself in the morning before breakfast. Write it down and keep it in a log. 2) Take your medicines as prescribed 3) Eat low salt foods--Limit salt (sodium) to 2000 mg per day.  4) Stay as active as you can everyday 5) Limit all fluids for the day to less than 2 liters  At the [facility], you and your health needs are our priority. As part of our continuing mission to provide you with exceptional heart care, we have created designated Provider Care Teams. These Care Teams include your primary Cardiologist (physician) and Advanced Practice Providers (APPs- Physician Assistants and Nurse Practitioners) who all work together to provide you with the care you need, when you need it.   You may see any of the following providers on your designated Care Team at your next follow up: Marland Kitchen Dr Arvilla Meres . Dr Marca Ancona . Dr Thornell Mule . Tonye Becket, NP . Robbie Lis, PA . Shanda Bumps [place],NP . Karle Plumber, PharmD   Please be sure to bring in all your medications bottles to every appointment.   If you have any questions or concerns before your next appointment please send [place] a message through [place] or call our office at [phone].    TO LEAVE A MESSAGE FOR THE NURSE SELECT OPTION 2, PLEASE LEAVE A MESSAGE INCLUDING: . YOUR NAME . DATE OF BIRTH . CALL  BACK NUMBER . REASON FOR CALL**this is important as we prioritize the call backs  YOU WILL RECEIVE A CALL BACK THE SAME DAY AS LONG AS YOU CALL BEFORE 4:00 PM

## 2020-09-18 ENCOUNTER — Encounter: Payer: Self-pay | Admitting: Cardiology

## 2020-09-18 ENCOUNTER — Ambulatory Visit (INDEPENDENT_AMBULATORY_CARE_PROVIDER_SITE_OTHER): Payer: No Typology Code available for payment source | Admitting: Cardiology

## 2020-09-18 VITALS — BP 108/80 | HR 88 | Ht 73.0 in | Wt 195.0 lb

## 2020-09-18 DIAGNOSIS — I10 Essential (primary) hypertension: Secondary | ICD-10-CM

## 2020-09-18 DIAGNOSIS — I429 Cardiomyopathy, unspecified: Secondary | ICD-10-CM

## 2020-09-18 DIAGNOSIS — I2510 Atherosclerotic heart disease of native coronary artery without angina pectoris: Secondary | ICD-10-CM | POA: Diagnosis not present

## 2020-09-18 DIAGNOSIS — E088 Diabetes mellitus due to underlying condition with unspecified complications: Secondary | ICD-10-CM | POA: Diagnosis not present

## 2020-09-18 DIAGNOSIS — E782 Mixed hyperlipidemia: Secondary | ICD-10-CM

## 2020-09-18 NOTE — Progress Notes (Signed)
Cardiology Office Note:    Date:  09/18/2020   ID:  Michael Guerra, DOB 19-Sep-1960, MRN [id number]  PCP:  Michael Dory, DO  Cardiologist:  Michael Brothers, MD   Referring MD: Michael Guerra*    ASSESSMENT:    1. Coronary artery disease involving native coronary artery of native heart without angina pectoris   2. Cardiomyopathy, unspecified type (HCC)   3. Essential hypertension   4. Diabetes mellitus due to underlying condition with unspecified complications (HCC)   5. Mixed dyslipidemia    PLAN:    In order of problems listed above:  1. Advanced cardiomyopathy: I discussed my findings with the patient at length.  I told him to weigh himself on a regular basis.  Salt intake in diet and such issues were discussed.  If he develops symptoms of postural hypotension I told him to take a little extra salt and water in the diet.  I told him to be careful about falls and syncope.  He understands.  I reviewed congestive heart failure clinic notes and he will be considered for a defibrillator in the future if there is no improvement in left-ventricular systolic function.  Last echocardiogram and MRI report was reviewed and discussed with him at length and questions were answered to his satisfaction. 2. Essential hypertension: Blood pressure stable and diet was emphasized.  As mentioned above measures to counter low blood pressure was discussed with 3. Mixed dyslipidemia and diabetes mellitus: On statin therapy.  Diet was emphasized.  This is managed by primary care. 4. Patient will be seen in follow-up appointment in 6 months or earlier if the patient has any concerns.  I reviewed findings of recent blood work with the patient at length.   Medication Adjustments/Labs and Tests Ordered: Current medicines are reviewed at length with the patient today.  Concerns regarding medicines are outlined above.  No orders of the defined types were placed in this encounter.  No orders of  the defined types were placed in this encounter.    Chief Complaint  Patient presents with  . Follow-up     History of Present Illness:    Michael Guerra is a 60 y.o. male.  Patient has past medical history of coronary artery disease, cardiomyopathy, essential hypertension and diabetes mellitus and dyslipidemia.  He denies any problems at this time and takes care of activities of daily living.  No chest pain orthopnea or PND.  At the time of my evaluation, the patient is alert awake oriented and in no distress.  He occasionally feels lightheaded when he changes posture.  But this is very occasional.  At the time of my evaluation, the patient is alert awake oriented and in no distress.  Past Medical History:  Diagnosis Date  . CAD (coronary artery disease) 06/08/2019  . Cardiomyopathy (HCC) 05/23/2019  . Cataract   . Chest discomfort 05/12/2019  . CHF (congestive heart failure) (HCC)   . Chronic kidney disease    one kidney that is healthy  . Chronic left shoulder pain 02/05/2020  . Diabetes mellitus due to underlying condition with unspecified complications (HCC) 05/12/2019  . Essential hypertension 05/12/2019  . Foot sprain, left, initial encounter 06/22/2019  . LIVER FUNCTION TESTS, ABNORMAL 05/21/2006   Qualifier: Diagnosis of  By: Maple Hudson MD, Riki Rusk    . Mixed dyslipidemia 05/12/2019  . Other specified abnormal findings of blood chemistry 05/20/2011   Formatting of this note might be different from the original. Elevated liver function  tests 10/1 IMO update  . Rectal polyp 05/20/2011   Formatting of this note might be different from the original. Noted march 2011 fu in march 2016  . Renal insufficiency 05/23/2019  . Skin lesion of chest wall 05/20/2011   Formatting of this note might be different from the original. Mid chest (prob seb/epidermoid cyst)  . Solitary kidney   . Type 2 diabetes mellitus with hyperglycemia, without long-term current use of insulin (HCC) 02/17/2006   Qualifier:  Diagnosis of  By: Maple Hudson MD, Riki Rusk      Past Surgical History:  Procedure Laterality Date  . ABDOMINAL SURGERY    . KIDNEY SURGERY     bowel obstruction  x 3  . LEFT HEART CATH AND CORONARY ANGIOGRAPHY N/A 05/31/2019   Procedure: LEFT HEART CATH AND CORONARY ANGIOGRAPHY;  Surgeon: Iran Ouch, MD;  Location: [facility];  Service: Cardiovascular;  Laterality: N/A;    Current Medications: Current Meds  Medication Sig  . acetaminophen (TYLENOL) 500 MG tablet Take 1,000 mg by mouth every 6 (six) hours as needed for moderate pain or headache.  Marland Kitchen aspirin 81 MG tablet Take 81 mg by mouth daily.  Marland Kitchen atorvastatin (LIPITOR) 40 MG tablet Take 40 mg by mouth daily.  . carvedilol (COREG) 12.5 MG tablet Take 12.5 mg by mouth 2 (two) times daily with a meal.  . dapagliflozin propanediol (FARXIGA) 10 MG TABS tablet Take 10 mg by mouth daily.  . Melatonin 10 MG CAPS Take 30 mg by mouth at bedtime as needed for sleep (sleep).  . metFORMIN (GLUCOPHAGE-XR) 500 MG 24 hr tablet Take 1,000 mg by mouth 2 (two) times daily.  . Multiple Vitamins-Minerals (MULTIVITAMIN ADULT) TABS Take 1 tablet by mouth daily with lunch.   . Omega-3 Fatty Acids (FISH OIL) 1000 MG CAPS Take 1,000 mg by mouth daily with lunch.   . Probiotic CAPS Take 2 capsules by mouth daily.  . sacubitril-valsartan (ENTRESTO) 49-51 MG Take 1 tablet by mouth 2 (two) times daily.  . sitaGLIPtin (JANUVIA) 100 MG tablet Take 1 tablet (100 mg total) by mouth daily.  Marland Kitchen spironolactone (ALDACTONE) 25 MG tablet Take 25 mg by mouth daily.  Marland Kitchen testosterone cypionate (DEPOTESTOTERONE CYPIONATE) 100 MG/ML injection Inject 80 mg into the muscle every Wednesday.  . triamcinolone ointment (KENALOG) 0.1 % Apply 1 application topically 2 (two) times daily.  . [DISCONTINUED] mupirocin ointment (BACTROBAN) 2 % Apply 1 application topically 3 (three) times daily.  . [DISCONTINUED] triamcinolone cream (KENALOG) 0.1 % Apply 1 application topically 2 (two)  times daily.     Allergies:   Lisinopril   Social History   Socioeconomic History  . Marital status: Married    Spouse name: Not on file  . Number of children: Not on file  . Years of education: Not on file  . Highest education level: Not on file  Occupational History  . Not on file  Tobacco Use  . Smoking status: Never Smoker  . Smokeless tobacco: Never Used  Substance and Sexual Activity  . Alcohol use: No  . Drug use: No  . Sexual activity: Not on file  Other Topics Concern  . Not on file  Social History Narrative  . Not on file   Social Determinants of Health   Financial Resource Strain: Not on file  Food Insecurity: Not on file  Transportation Needs: Not on file  Physical Activity: Not on file  Stress: Not on file  Social Connections: Not on file  Family History: The patient's family history is negative for Cancer, Colon cancer, Esophageal cancer, Rectal cancer, and Stomach cancer.  ROS:   Please see the history of present illness.    All other systems reviewed and are negative.  EKGs/Labs/Other Studies Reviewed:    The following studies were reviewed today: IMPRESSION: 1.  Mildly dilated LV with EF 15%, diffuse hypokinesis.  2. Normal RV size with mildly decreased systolic function visually (unable to quantify).  3. No myocardial LGE noted, so no definitive evidence for prior MI, infiltrative disease, or myocarditis.  Dalton Mclean   Electronically Signed   By: Marca Ancona M.D.   On: 04/02/2020 17:08   Recent Labs: 10/18/2019: Hemoglobin 16.8; Magnesium 2.0; Platelets 187; TSH 2.150 09/02/2020: ALT 31; BUN 18; Creatinine, Ser 1.45; Potassium 4.1; Sodium 137  Recent Lipid Panel    Component Value Date/Time   CHOL 84 09/02/2020 0733   CHOL 115 10/18/2019 0836   TRIG 78.0 09/02/2020 0733   HDL 36.10 (L) 09/02/2020 0733   HDL 37 (L) 10/18/2019 0836   CHOLHDL 2 09/02/2020 0733   VLDL 15.6 09/02/2020 0733   LDLCALC 33 09/02/2020  0733   LDLCALC 60 10/18/2019 0836   LDLDIRECT 97.0 10/18/2017 0941    Physical Exam:    VS:  BP 108/80 (BP Location: Right Arm, Patient Position: Sitting, Cuff Size: Normal)   Pulse 88   Ht 6\' 1"  (1.854 m)   Wt 195 lb (88.5 kg)   SpO2 97%   BMI 25.73 kg/m     Wt Readings from Last 3 Encounters:  09/18/20 195 lb (88.5 kg)  09/17/20 198 lb 6.4 oz (90 kg)  08/19/20 196 lb 4 oz (89 kg)     GEN: Patient is in no acute distress HEENT: Normal NECK: No JVD; No carotid bruits LYMPHATICS: No lymphadenopathy CARDIAC: Hear sounds regular, 2/6 systolic murmur at the apex. RESPIRATORY:  Clear to auscultation without rales, wheezing or rhonchi  ABDOMEN: Soft, non-tender, non-distended MUSCULOSKELETAL:  No edema; No deformity  SKIN: Warm and dry NEUROLOGIC:  Alert and oriented x 3 PSYCHIATRIC:  Normal affect   Signed, 10/19/20, MD  09/18/2020 9:13 AM    [facility]

## 2020-09-18 NOTE — Patient Instructions (Signed)

## 2020-09-30 ENCOUNTER — Other Ambulatory Visit (HOSPITAL_COMMUNITY): Payer: Self-pay | Admitting: Cardiology

## 2020-09-30 ENCOUNTER — Other Ambulatory Visit (HOSPITAL_COMMUNITY): Payer: Self-pay

## 2020-09-30 ENCOUNTER — Other Ambulatory Visit (HOSPITAL_BASED_OUTPATIENT_CLINIC_OR_DEPARTMENT_OTHER): Payer: Self-pay

## 2020-09-30 MED ORDER — TESTOSTERONE CYPIONATE 100 MG/ML IM SOLN
INTRAMUSCULAR | 2 refills | Status: DC
  Filled 2020-09-30: qty 10, 28d supply, fill #0

## 2020-10-02 ENCOUNTER — Other Ambulatory Visit (HOSPITAL_COMMUNITY): Payer: Self-pay | Admitting: Cardiology

## 2020-10-02 ENCOUNTER — Other Ambulatory Visit (HOSPITAL_BASED_OUTPATIENT_CLINIC_OR_DEPARTMENT_OTHER): Payer: Self-pay

## 2020-10-02 MED FILL — Carvedilol Tab 12.5 MG: ORAL | 90 days supply | Qty: 180 | Fill #0 | Status: CN

## 2020-10-04 ENCOUNTER — Other Ambulatory Visit (HOSPITAL_BASED_OUTPATIENT_CLINIC_OR_DEPARTMENT_OTHER): Payer: Self-pay

## 2020-10-05 ENCOUNTER — Other Ambulatory Visit (HOSPITAL_COMMUNITY): Payer: Self-pay

## 2020-10-08 ENCOUNTER — Other Ambulatory Visit (HOSPITAL_BASED_OUTPATIENT_CLINIC_OR_DEPARTMENT_OTHER): Payer: Self-pay

## 2020-10-11 ENCOUNTER — Other Ambulatory Visit (HOSPITAL_BASED_OUTPATIENT_CLINIC_OR_DEPARTMENT_OTHER): Payer: Self-pay

## 2020-10-14 ENCOUNTER — Other Ambulatory Visit (HOSPITAL_COMMUNITY): Payer: Self-pay

## 2020-10-16 ENCOUNTER — Other Ambulatory Visit (HOSPITAL_BASED_OUTPATIENT_CLINIC_OR_DEPARTMENT_OTHER): Payer: Self-pay

## 2020-10-16 ENCOUNTER — Other Ambulatory Visit (HOSPITAL_COMMUNITY): Payer: Self-pay

## 2020-10-16 MED FILL — Carvedilol Tab 12.5 MG: ORAL | 90 days supply | Qty: 180 | Fill #0 | Status: AC

## 2020-10-18 ENCOUNTER — Other Ambulatory Visit (HOSPITAL_COMMUNITY): Payer: Self-pay

## 2020-10-18 ENCOUNTER — Other Ambulatory Visit (HOSPITAL_COMMUNITY): Payer: Self-pay | Admitting: Cardiology

## 2020-10-18 MED ORDER — ENTRESTO 49-51 MG PO TABS
1.0000 | ORAL_TABLET | Freq: Two times a day (BID) | ORAL | 3 refills | Status: DC
  Filled 2020-10-18: qty 180, 90d supply, fill #0
  Filled 2021-02-10: qty 180, 90d supply, fill #1

## 2020-10-24 ENCOUNTER — Other Ambulatory Visit (HOSPITAL_COMMUNITY): Payer: Self-pay

## 2020-11-05 ENCOUNTER — Other Ambulatory Visit (HOSPITAL_COMMUNITY): Payer: Self-pay

## 2020-11-07 ENCOUNTER — Other Ambulatory Visit (HOSPITAL_BASED_OUTPATIENT_CLINIC_OR_DEPARTMENT_OTHER): Payer: Self-pay

## 2020-11-07 ENCOUNTER — Other Ambulatory Visit: Payer: Self-pay | Admitting: Family Medicine

## 2020-11-07 DIAGNOSIS — E785 Hyperlipidemia, unspecified: Secondary | ICD-10-CM

## 2020-11-07 DIAGNOSIS — E119 Type 2 diabetes mellitus without complications: Secondary | ICD-10-CM

## 2020-11-07 MED ORDER — ATORVASTATIN CALCIUM 40 MG PO TABS
40.0000 mg | ORAL_TABLET | Freq: Every day | ORAL | 1 refills | Status: DC
  Filled 2020-11-07: qty 90, 90d supply, fill #0
  Filled 2021-02-10: qty 90, 90d supply, fill #1

## 2020-11-18 ENCOUNTER — Other Ambulatory Visit (HOSPITAL_COMMUNITY): Payer: Self-pay

## 2020-11-21 ENCOUNTER — Ambulatory Visit (HOSPITAL_COMMUNITY)
Admission: RE | Admit: 2020-11-21 | Discharge: 2020-11-21 | Disposition: A | Discharge status: Home / Self Care | Payer: No Typology Code available for payment source | Source: Ambulatory Visit | Attending: Adult Health | Admitting: Adult Health

## 2020-11-21 ENCOUNTER — Encounter (HOSPITAL_COMMUNITY): Payer: Self-pay | Admitting: Cardiology

## 2020-11-21 ENCOUNTER — Ambulatory Visit (HOSPITAL_BASED_OUTPATIENT_CLINIC_OR_DEPARTMENT_OTHER)
Admission: RE | Admit: 2020-11-21 | Discharge: 2020-11-21 | Disposition: A | Discharge status: Home / Self Care | Payer: No Typology Code available for payment source | Source: Ambulatory Visit | Attending: Cardiology | Admitting: Cardiology

## 2020-11-21 ENCOUNTER — Other Ambulatory Visit: Payer: Self-pay

## 2020-11-21 ENCOUNTER — Other Ambulatory Visit (HOSPITAL_BASED_OUTPATIENT_CLINIC_OR_DEPARTMENT_OTHER): Payer: Self-pay

## 2020-11-21 VITALS — BP 120/78 | HR 65 | Wt 199.6 lb

## 2020-11-21 DIAGNOSIS — E1122 Type 2 diabetes mellitus with diabetic chronic kidney disease: Secondary | ICD-10-CM | POA: Insufficient documentation

## 2020-11-21 DIAGNOSIS — I429 Cardiomyopathy, unspecified: Secondary | ICD-10-CM | POA: Insufficient documentation

## 2020-11-21 DIAGNOSIS — E785 Hyperlipidemia, unspecified: Secondary | ICD-10-CM | POA: Insufficient documentation

## 2020-11-21 DIAGNOSIS — I509 Heart failure, unspecified: Secondary | ICD-10-CM | POA: Diagnosis not present

## 2020-11-21 DIAGNOSIS — Z7982 Long term (current) use of aspirin: Secondary | ICD-10-CM | POA: Diagnosis not present

## 2020-11-21 DIAGNOSIS — Z79899 Other long term (current) drug therapy: Secondary | ICD-10-CM | POA: Insufficient documentation

## 2020-11-21 DIAGNOSIS — I5022 Chronic systolic (congestive) heart failure: Secondary | ICD-10-CM

## 2020-11-21 DIAGNOSIS — I130 Hypertensive heart and chronic kidney disease with heart failure and stage 1 through stage 4 chronic kidney disease, or unspecified chronic kidney disease: Secondary | ICD-10-CM | POA: Diagnosis not present

## 2020-11-21 DIAGNOSIS — I2510 Atherosclerotic heart disease of native coronary artery without angina pectoris: Secondary | ICD-10-CM | POA: Insufficient documentation

## 2020-11-21 DIAGNOSIS — Z7984 Long term (current) use of oral hypoglycemic drugs: Secondary | ICD-10-CM | POA: Insufficient documentation

## 2020-11-21 DIAGNOSIS — N1831 Chronic kidney disease, stage 3a: Secondary | ICD-10-CM | POA: Diagnosis not present

## 2020-11-21 DIAGNOSIS — Z7901 Long term (current) use of anticoagulants: Secondary | ICD-10-CM | POA: Diagnosis not present

## 2020-11-21 LAB — BASIC METABOLIC PANEL
Anion gap: 7 (ref 5–15)
BUN: 15 mg/dL (ref 8–23)
CO2: 25 mmol/L (ref 22–32)
Calcium: 9.5 mg/dL (ref 8.9–10.3)
Chloride: 105 mmol/L (ref 98–111)
Creatinine, Ser: 1.23 mg/dL (ref 0.61–1.24)
GFR, Estimated: >60 mL/min (ref >60)
Glucose, Bld: 115 mg/dL — ABNORMAL HIGH (ref 70–99)
Potassium: 4.6 mmol/L (ref 3.5–5.1)
Sodium: 137 mmol/L (ref 135–145)

## 2020-11-21 LAB — ECHOCARDIOGRAM COMPLETE
Calc EF: 39.2 %
S' Lateral: 4.6 cm
Single Plane A2C EF: 37.9 %
Single Plane A4C EF: 37.1 %

## 2020-11-21 MED ORDER — DAPAGLIFLOZIN PROPANEDIOL 10 MG PO TABS
10.0000 mg | ORAL_TABLET | Freq: Every day | ORAL | 11 refills | Status: DC
  Filled 2020-11-21: qty 30, 30d supply, fill #0
  Filled 2020-12-21: qty 30, 30d supply, fill #1
  Filled 2021-01-22: qty 30, 30d supply, fill #2
  Filled 2021-02-17: qty 30, 30d supply, fill #3
  Filled 2021-03-14: qty 30, 30d supply, fill #4
  Filled 2021-04-11: qty 30, 30d supply, fill #5
  Filled 2021-05-21: qty 30, 30d supply, fill #6

## 2020-11-21 NOTE — Progress Notes (Signed)
PCP: Sharlene Dory, DO Cardiology: Dr. Tomie China HF Cardiology: Dr. Shirlee Latch  60 y.o. with history of CKD stage 3, HTN, and diabetes was referred for evaluation of CHF by Dr. Tomie China.  Patient has had diabetes and HTN for about 10-15 years per his report.  He has CKD stage 3 in setting of nephrectomy done in childhood due to trauma, follows with nephrology.  He developed atypical chest pain in 2/21 and had Cardiolite done.  This showed EF 23% with fixed inferior defect.  Echo showed EF 35-40%, mild LV dilation, no LVH, normal RV.  He then had LHC done, showing OM1 65% stenosis, distal LAD 70% stenosis.  Cardiomyopathy was thought to be out of proportion to CAD. No recent viral-type infection symptoms.  No strong family history of CHF.    Cardiac MRI in 12/21 was a difficult study, EF 15% with diffuse hypokinesis, mildly decreased RV systolic function, no LGE.    Echo in 3/22 showed EF 30-35%, diffuse hypokinesis, normal RV, normal IVC.  Echo was done today and reviewed, EF 30-35% with mildly decreased RV function, normal IVC.   He returns for followup of CHF. 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  No further lightheadedness.  No dyspnea, orthopnea, PND, or chest pain.  Weight stable.     Labs (6/21): LDL 60, K 4.6, creatinine 1.5 Labs (1/22): K 4.5, creatinine 1.46 Labs (3/22): K 4.7, creatinine 1.4 Labs (5/22): K 4.1, creatinine 1.45, LDL 33  PMH: 1. CKD stage 3: H/o nephrectomy in childhood post-trauma.  2. HTN - Renal artery dopplers (9/21): No significant stenosis.  3. Hyperlipidemia 4. Type 2 diabetes 5. CAD: LHC (2/21) with OM1 65% stenosis, distal LAD 70% stenosis.  6. Chronic systolic CHF: Nonischemic cardiomyopathy, cardiomyopathy out of proportion to CAD.  - Echo (2/21) with EF 35-40%, mild LV dilation, no LVH, normal RV.  - Cardiac MRI (12/21): Difficult study, EF 15% with diffuse hypokinesis, mildly decreased RV systolic function,  no LGE.   - Echo (3/22): EF 30-35%, diffuse hypokinesis, normal RV, normal IVC.  - Echo (8/22): EF 30-35% with mildly decreased RV function, normal IVC.   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   FH: Mother with diabetes, possible CHF.   ROS: All systems reviewed and negative except as per HPI.   Current Outpatient Medications  Medication Sig Dispense Refill   acetaminophen (TYLENOL) 500 MG tablet Take 1,000 mg by mouth every 6 (six) hours as needed for moderate pain or headache.     aspirin 81 MG tablet Take 81 mg by mouth daily.     atorvastatin (LIPITOR) 40 MG tablet Take 1 tablet (40 mg total) by mouth daily. 90 tablet 1   carvedilol (COREG) 12.5 MG tablet TAKE 1 TABLET (12.5 MG TOTAL) BY MOUTH 2 (TWO) TIMES DAILY WITH A MEAL. 180 tablet 3   Melatonin 10 MG CAPS Take 30 mg by mouth at bedtime as needed for sleep (sleep).     Multiple Vitamins-Minerals (MULTIVITAMIN ADULT) TABS Take 1 tablet by mouth daily with lunch.      Omega-3 Fatty Acids (FISH OIL) 1000 MG CAPS Take 1,000 mg by mouth daily. As needed     Probiotic CAPS Take 2 capsules by mouth daily.     sacubitril-valsartan (ENTRESTO) 49-51 MG Take 1 tablet by mouth 2 (two) times daily.     sitaGLIPtin (JANUVIA) 100 MG tablet Take  1 tablet (100 mg total) by mouth daily. 90 tablet 1   spironolactone (ALDACTONE) 25 MG tablet Take 25 mg by mouth daily.     testosterone cypionate (DEPOTESTOTERONE CYPIONATE) 100 MG/ML injection Inject 80 mg into the muscle every Wednesday.     triamcinolone ointment (KENALOG) 0.1 % Apply 1 application topically 2 (two) times daily. As needed     dapagliflozin propanediol (FARXIGA) 10 MG TABS tablet Take 1 tablet (10 mg total) by mouth daily. 30 tablet 11   No current facility-administered medications for this encounter.   BP 120/78   Pulse 65   Wt 90.5 kg (199 lb 9.6 oz)   SpO2 98%   BMI 26.33 kg/m  General: NAD Neck: No JVD, no thyromegaly or  thyroid nodule.  Lungs: Clear to auscultation bilaterally with normal respiratory effort. CV: Nondisplaced PMI.  Heart regular S1/S2, no S3/S4, no murmur.  No peripheral edema.  No carotid bruit.  Normal pedal pulses.  Abdomen: Soft, nontender, no hepatosplenomegaly, no distention.  Skin: Intact without lesions or rashes.  Neurologic: Alert and oriented x 3.  Psych: Normal affect. Extremities: No clubbing or cyanosis.  HEENT: Normal.   Assessment/Plan: 1. Chronic systolic CHF: Echo in 2/21 with EF 35-40%.  LHC in 2/21 with moderate coronary disease, but coronary disease appears out of proportion to the severity of the cardiomyopathy.  It is possible that long-standing HTN and DM have triggered the cardiomyopathy.  Cardiac MRI in 12/21 was a difficult study, showed EF 15% with diffuse hypokinesis, mildly decreased RV systolic function, no LGE.  No evidence for infiltrative disease or myocarditis. Echo today showed stable EF 30-35%, mildly decreased RV systolic function. NYHA class I-II symptoms, not volume overloaded on exam.  - Continue Entresto 49/51 bid.  - Continue Coreg 12.5 mg bid.  - Continue spironolactone 25 mg dailiy.  - He has been off [place], not sure why.  Restart Farxiga 10 mg daily with BMET today and in 10 days.  - EF has been persistently low, will need to consider ICD though with minimal symptoms and nonischemic etiology, benefit may not be as high.  However, he is relatively young and healthy, so I think that ICD placement would be reasonable.  Not CRT candidate with narrow QRS.  He wants to discuss this with his wife and will let me know if he wants EP referral.  2. CAD: Moderate disease, no intervention on 2/21 cath. No chest pain.  - Continue ASA 81 daily.  - Continue atorvastatin, good lipids in 5/22.  3. HTN: BP controlled. 4. CKD stage 3a: Solitary kidney.   Follow carefully.  - Restart Marcelline Deist as above.   Followup with HF pharmacist in 3 wks for med titration.  Followup with APP in 2 months.  Marca Ancona 11/21/2020

## 2020-11-21 NOTE — Patient Instructions (Addendum)
Labs done today. We will contact you only if your labs are abnormal.  RESTART Farxiga 10mg  (1 tablet) by mouth daily.   No other medication changes were made. Please continue all current medications as prescribed.  Your physician recommends that you schedule a follow-up appointment in: 10 days for a lab only appointment,3 weeks with our Pharmacist and in 2 months with our APP clinic here in our office  If you have any questions or concerns before your next appointment please send a message through [place] or call our office at [phone].    TO LEAVE A MESSAGE FOR THE NURSE SELECT OPTION 2, PLEASE LEAVE A MESSAGE INCLUDING: YOUR NAME DATE OF BIRTH CALL BACK NUMBER REASON FOR CALL**this is important as we prioritize the call backs  YOU WILL RECEIVE A CALL BACK THE SAME DAY AS LONG AS YOU CALL BEFORE 4:00 PM   Do the following things EVERYDAY: Weigh yourself in the morning before breakfast. Write it down and keep it in a log. Take your medicines as prescribed Eat low salt foods--Limit salt (sodium) to 2000 mg per day.  Stay as active as you can everyday Limit all fluids for the day to less than 2 liters   At [facility], you and your health needs are our priority. As part of our continuing mission to provide you with exceptional heart care, we have created designated Provider Care Teams. These Care Teams include your primary Cardiologist (physician) and Advanced Practice Providers (APPs- Physician Assistants and Nurse Practitioners) who all work together to provide you with the care you need, when you need it.   You may see any of the following providers on your designated Care Team at your next follow up: Dr [phone] Dr Arvilla Meres, NP Carron Curie, Robbie Lis [place], PharmD   Please be sure to bring in all your medications bottles to every appointment. '

## 2020-11-21 NOTE — Progress Notes (Signed)
  Echocardiogram 2D Echocardiogram has been performed.  Michael Guerra 11/21/2020, 8:46 AM

## 2020-11-29 ENCOUNTER — Other Ambulatory Visit (HOSPITAL_COMMUNITY): Payer: Self-pay

## 2020-11-29 ENCOUNTER — Other Ambulatory Visit: Payer: Self-pay | Admitting: Urology

## 2020-12-03 ENCOUNTER — Other Ambulatory Visit (HOSPITAL_BASED_OUTPATIENT_CLINIC_OR_DEPARTMENT_OTHER): Payer: Self-pay

## 2020-12-03 ENCOUNTER — Other Ambulatory Visit (HOSPITAL_COMMUNITY): Payer: Self-pay

## 2020-12-04 ENCOUNTER — Other Ambulatory Visit (HOSPITAL_BASED_OUTPATIENT_CLINIC_OR_DEPARTMENT_OTHER): Payer: Self-pay

## 2020-12-04 ENCOUNTER — Ambulatory Visit (HOSPITAL_COMMUNITY)
Admission: RE | Admit: 2020-12-04 | Discharge: 2020-12-04 | Disposition: A | Discharge status: Home / Self Care | Payer: No Typology Code available for payment source | Source: Ambulatory Visit | Attending: Cardiology | Admitting: Cardiology

## 2020-12-04 ENCOUNTER — Other Ambulatory Visit: Payer: Self-pay

## 2020-12-04 DIAGNOSIS — I5022 Chronic systolic (congestive) heart failure: Secondary | ICD-10-CM | POA: Insufficient documentation

## 2020-12-04 LAB — BASIC METABOLIC PANEL
Anion gap: 10 (ref 5–15)
BUN: 20 mg/dL (ref 8–23)
CO2: 26 mmol/L (ref 22–32)
Calcium: 9.7 mg/dL (ref 8.9–10.3)
Chloride: 98 mmol/L (ref 98–111)
Creatinine, Ser: 1.45 mg/dL — ABNORMAL HIGH (ref 0.61–1.24)
GFR, Estimated: 54 mL/min — ABNORMAL LOW (ref >60)
Glucose, Bld: 207 mg/dL — ABNORMAL HIGH (ref 70–99)
Potassium: 4.2 mmol/L (ref 3.5–5.1)
Sodium: 134 mmol/L — ABNORMAL LOW (ref 135–145)

## 2020-12-05 ENCOUNTER — Other Ambulatory Visit (HOSPITAL_BASED_OUTPATIENT_CLINIC_OR_DEPARTMENT_OTHER): Payer: Self-pay

## 2020-12-05 MED ORDER — "BD LUER-LOK SYRINGE 18G X 1-1/2"" 3 ML MISC"
3 refills | Status: DC
  Filled 2020-12-05: qty 4, 28d supply, fill #0
  Filled 2020-12-27 – 2020-12-30 (×2): qty 4, 28d supply, fill #1
  Filled 2021-01-30: qty 8, 56d supply, fill #2

## 2020-12-05 MED FILL — Needle (Disp) 22 x 1-1/2": 28 days supply | Qty: 4 | Fill #0 | Status: AC

## 2020-12-06 NOTE — Progress Notes (Signed)
PCP: Sharlene Dory, DO Cardiology: Dr. Tomie China HF Cardiology: Dr. Shirlee Latch  HPI:   60 y.o. with history of CKD stage 3, HTN, and diabetes was referred for evaluation of CHF by Dr. Tomie China.  Patient has had diabetes and HTN for about 10-15 years per his report.  He has CKD stage 3 in setting of nephrectomy done in childhood due to trauma, follows with nephrology.  He developed atypical chest pain in 05/2019 and had Cardiolite done.  This showed EF 23% with fixed inferior defect.  Echo showed EF 35-40%, mild LV dilation, no LVH, normal RV.  He then had LHC done, showing OM1 65% stenosis, distal LAD 70% stenosis.  Cardiomyopathy was thought to be out of proportion to CAD. No recent viral-type infection symptoms.  No strong family history of CHF.     Cardiac MRI in 03/2020 was a difficult study, EF 15% with diffuse hypokinesis, mildly decreased RV systolic function, no LGE.     Echo in 06/2020 showed EF 30-35%, diffuse hypokinesis, normal RV, normal IVC.  Echo was done 11/21/20 and reviewed, EF 30-35% with mildly decreased RV function, normal IVC.    He returned to HF Clinic for followup of CHF on 11/21/20.  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  No further lightheadedness.  No dyspnea, orthopnea, PND, or chest pain.  Weight was stable.  Today he returns to HF clinic for pharmacist medication titration. At last visit with MD, Marcelline Deist 10 mg daily was restarted. Overall feeling well today. Occasional dizziness 2-3 times per week. This is not bothersome. No chest pain or palpitations. No SOB/DOE. Weight has been stable at home, ~195 lbs. Does not take any diuretic. No LEE, PND or orthopnea. Taking all medications as prescribed and tolerating all medications.     HF Medications: Carvedilol 12.5 mg BID Entresto 49/51 mg BID Spironolactone 25 mg daily Farxiga 10 mg daily  Has the patient been experiencing any side effects to the medications prescribed?  No,  patient is tolerating his medications well.  Does the patient have any problems obtaining medications due to transportation or finances?   No, patient has [facility].   Understanding of regimen: good Understanding of indications: good Potential of compliance: good Patient understands to avoid NSAIDs. Patient understands to avoid decongestants.    Pertinent Lab Values  12/04/20: Serum creatinine 1.45, BUN 20, Potassium 4.2, Sodium 134   Vital Signs: Weight: 202.8 lbs (last clinic weight: 199.6 lbs) Blood pressure: 112/66 Heart rate: 81  Assessment/Plan: 1. Chronic systolic CHF: Echo in 05/2019 with EF 35-40%.  LHC in 05/2019 with moderate coronary disease, but coronary disease appears out of proportion to the severity of the cardiomyopathy.  It is possible that long-standing HTN and DM have triggered the cardiomyopathy.  Cardiac MRI in 03/2020 was a difficult study, showed EF 15% with diffuse hypokinesis, mildly decreased RV systolic function, no LGE.  No evidence for infiltrative disease or myocarditis. Echo 11/2020 showed stable EF 30-35%, mildly decreased RV systolic function.  - NYHA class I-II symptoms, not volume overloaded on exam.  - Increase carvedilol to 25 mg BID.  - Continue Entresto 49-51 mg BID.  - Continue spironolactone 25 mg daily. Previously discontinued due to hyperkalemia, but those labs were hemolyzed. All recent labs have been stable. - Continue Farxiga 10 mg daily.  2. CAD: Moderate disease, no intervention on 05/2019 cath. No chest pain.  - Continue aspirin 81 daily.  -  Continue atorvastatin 40 mg daily  3. HTN: - Continue current regimen   4. CKD stage 3a: Solitary kidney.   Follow carefully.  - Continue Farxiga.    Karle Plumber, PharmD, BCPS, BCCP, CPP [facility] Pharmacist [phone]

## 2020-12-13 ENCOUNTER — Other Ambulatory Visit (HOSPITAL_COMMUNITY): Payer: Self-pay

## 2020-12-17 ENCOUNTER — Other Ambulatory Visit (HOSPITAL_BASED_OUTPATIENT_CLINIC_OR_DEPARTMENT_OTHER): Payer: Self-pay

## 2020-12-19 ENCOUNTER — Other Ambulatory Visit: Payer: Self-pay

## 2020-12-19 ENCOUNTER — Other Ambulatory Visit (HOSPITAL_BASED_OUTPATIENT_CLINIC_OR_DEPARTMENT_OTHER): Payer: Self-pay

## 2020-12-19 ENCOUNTER — Ambulatory Visit (HOSPITAL_COMMUNITY)
Admission: RE | Admit: 2020-12-19 | Discharge: 2020-12-19 | Disposition: A | Discharge status: Home / Self Care | Payer: No Typology Code available for payment source | Source: Ambulatory Visit | Attending: Internal Medicine | Admitting: Internal Medicine

## 2020-12-19 VITALS — BP 112/66 | HR 81 | Wt 202.8 lb

## 2020-12-19 DIAGNOSIS — I5022 Chronic systolic (congestive) heart failure: Secondary | ICD-10-CM | POA: Diagnosis present

## 2020-12-19 DIAGNOSIS — E1122 Type 2 diabetes mellitus with diabetic chronic kidney disease: Secondary | ICD-10-CM | POA: Insufficient documentation

## 2020-12-19 DIAGNOSIS — I2510 Atherosclerotic heart disease of native coronary artery without angina pectoris: Secondary | ICD-10-CM | POA: Insufficient documentation

## 2020-12-19 DIAGNOSIS — Z905 Acquired absence of kidney: Secondary | ICD-10-CM | POA: Insufficient documentation

## 2020-12-19 DIAGNOSIS — I130 Hypertensive heart and chronic kidney disease with heart failure and stage 1 through stage 4 chronic kidney disease, or unspecified chronic kidney disease: Secondary | ICD-10-CM | POA: Diagnosis not present

## 2020-12-19 DIAGNOSIS — N1831 Chronic kidney disease, stage 3a: Secondary | ICD-10-CM | POA: Diagnosis not present

## 2020-12-19 MED ORDER — CARVEDILOL 25 MG PO TABS
25.0000 mg | ORAL_TABLET | Freq: Two times a day (BID) | ORAL | 3 refills | Status: DC
  Filled 2020-12-19: qty 180, 90d supply, fill #0
  Filled 2021-05-13: qty 180, 90d supply, fill #1
  Filled 2021-09-01: qty 180, 90d supply, fill #2
  Filled 2021-12-15: qty 180, 90d supply, fill #3

## 2020-12-19 NOTE — Patient Instructions (Addendum)
It was a pleasure seeing you today!  MEDICATIONS: -We are changing your medications today -Increase carvedilol to 25 mg (1 tablet) twice daily. -Call if you have questions about your medications.   NEXT APPOINTMENT: Return to clinic in 4 weeks with APP [facility].  In general, to take care of your heart failure: -Limit your fluid intake to 2 Liters (half-gallon) per day.   -Limit your salt intake to ideally 2-3 grams (2000-3000 mg) per day. -Weigh yourself daily and record, and bring that "weight diary" to your next appointment.  (Weight gain of 2-3 pounds in 1 day typically means fluid weight.) -The medications for your heart are to help your heart and help you live longer.   -Please contact us before stopping any of your heart medications.  Call the clinic at [phone] with questions or to reschedule future appointments.

## 2020-12-24 ENCOUNTER — Other Ambulatory Visit (HOSPITAL_BASED_OUTPATIENT_CLINIC_OR_DEPARTMENT_OTHER): Payer: Self-pay

## 2020-12-27 ENCOUNTER — Other Ambulatory Visit (HOSPITAL_COMMUNITY): Payer: Self-pay

## 2020-12-27 MED FILL — Needle (Disp) 22 x 1-1/2": 28 days supply | Qty: 4 | Fill #1 | Status: CN

## 2020-12-30 ENCOUNTER — Other Ambulatory Visit (HOSPITAL_COMMUNITY): Payer: Self-pay

## 2020-12-30 ENCOUNTER — Other Ambulatory Visit (HOSPITAL_BASED_OUTPATIENT_CLINIC_OR_DEPARTMENT_OTHER): Payer: Self-pay

## 2020-12-30 MED ORDER — "NEEDLE (DISP) 22G X 1"" MISC"
8 refills | Status: DC
  Filled 2020-12-30: qty 4, 28d supply, fill #0
  Filled 2021-01-30: qty 12, 84d supply, fill #1

## 2020-12-30 MED FILL — Needle (Disp) 22 x 1-1/2": 28 days supply | Qty: 4 | Fill #1 | Status: CN

## 2020-12-31 ENCOUNTER — Other Ambulatory Visit (HOSPITAL_BASED_OUTPATIENT_CLINIC_OR_DEPARTMENT_OTHER): Payer: Self-pay

## 2020-12-31 MED ORDER — TESTOSTERONE CYPIONATE 100 MG/ML IM SOLN
INTRAMUSCULAR | 2 refills | Status: DC
  Filled 2020-12-31: qty 10, 28d supply, fill #0

## 2021-01-02 ENCOUNTER — Other Ambulatory Visit (HOSPITAL_COMMUNITY): Payer: Self-pay

## 2021-01-02 MED ORDER — MUPIROCIN 2 % EX OINT
1.0000 "application " | TOPICAL_OINTMENT | Freq: Two times a day (BID) | CUTANEOUS | 0 refills | Status: DC
  Filled 2021-01-02: qty 22, 11d supply, fill #0

## 2021-01-02 MED ORDER — TRIAMCINOLONE ACETONIDE 0.1 % EX OINT
1.0000 "application " | TOPICAL_OINTMENT | Freq: Two times a day (BID) | CUTANEOUS | 0 refills | Status: DC | PRN
  Filled 2021-01-02: qty 454, 90d supply, fill #0

## 2021-01-06 ENCOUNTER — Ambulatory Visit: Payer: No Typology Code available for payment source | Admitting: Cardiology

## 2021-01-09 ENCOUNTER — Other Ambulatory Visit (HOSPITAL_COMMUNITY): Payer: Self-pay

## 2021-01-17 NOTE — Progress Notes (Signed)
PCP: Sharlene Dory, DO Cardiology: Dr. Tomie China HF Cardiology: Dr. Frazier Richards Michael Guerra is a 60 y.o. with history of CKD stage 3, HTN, and diabetes was referred for evaluation of CHF by Dr. Tomie China.  Patient has had diabetes and HTN for about 10-15 years per his report.  He has CKD stage 3 in setting of nephrectomy done in childhood due to trauma, follows with nephrology.  He developed atypical chest pain in 2/21 and had Cardiolite done.  This showed EF 23% with fixed inferior defect.  Echo showed EF 35-40%, mild LV dilation, no LVH, normal RV.  He then had LHC done, showing OM1 65% stenosis, distal LAD 70% stenosis.  Cardiomyopathy was thought to be out of proportion to CAD. No recent viral-type infection symptoms.  No strong family history of CHF.    Cardiac MRI in 12/21 was a difficult study, EF 15% with diffuse hypokinesis, mildly decreased RV systolic function, no LGE.    Echo in 3/22 showed EF 30-35%, diffuse hypokinesis, normal RV, normal IVC.    Echo 8/22 EF 30-35% with mildly decreased RV function, normal IVC.    Today he returns for HF follow up. He was seen by pharmacy and carvedilol increased however he went on vacation and did not increase his dose. He does not have significant exertional dyspnea. He does have occasional positional dizziness, but no falls. Denies CP, edema, or PND/Orthopnea. Appetite ok. No fever or chills. Weight at home 192-195 pounds. He is working full time as a Electrical engineer at [employer]. He has not decided if he wants to pursue ICD yet.  ECG (personally reviewed): SR 70 bpm  Labs (6/21): LDL 60, K 4.6, creatinine 1.5 Labs (1/22): K 4.5, creatinine 1.46 Labs (3/22): K 4.7, creatinine 1.4 Labs (5/22): K 4.1, creatinine 1.45, LDL 33 Labs (8/22): K 4.2, creatinine 1.45  PMH: 1. CKD stage 3: H/o nephrectomy in childhood post-trauma.  2. HTN - Renal artery dopplers (9/21): No significant stenosis.  3. Hyperlipidemia 4. Type 2 diabetes 5. CAD:  LHC (2/21) with OM1 65% stenosis, distal LAD 70% stenosis.  6. Chronic systolic CHF: Nonischemic cardiomyopathy, cardiomyopathy out of proportion to CAD.  - Echo (2/21) with EF 35-40%, mild LV dilation, no LVH, normal RV.  - Cardiac MRI (12/21): Difficult study, EF 15% with diffuse hypokinesis, mildly decreased RV systolic function, no LGE.   - Echo (3/22): EF 30-35%, diffuse hypokinesis, normal RV, normal IVC.  - Echo (8/22): EF 30-35% with mildly decreased RV function, normal IVC.   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   FH: Mother with diabetes, possible CHF.   ROS: All systems reviewed and negative except as per HPI.   Current Outpatient Medications  Medication Sig Dispense Refill   acetaminophen (TYLENOL) 500 MG tablet Take 1,000 mg by mouth every 6 (six) hours as needed for moderate pain or headache.     aspirin 81 MG tablet Take 81 mg by mouth daily.     atorvastatin (LIPITOR) 40 MG tablet Take 1 tablet (40 mg total) by mouth daily. 90 tablet 1   carvedilol (COREG) 25 MG tablet Take 1 tablet (25 mg total) by mouth 2 (two) times daily with a meal. (Patient taking differently: Take 12.5 mg by mouth 2 (two) times daily with a meal.) 180 tablet 3   dapagliflozin propanediol (FARXIGA) 10 MG TABS tablet Take 1 tablet (10 mg total) by mouth daily. 30 tablet 11  Melatonin 10 MG CAPS Take 30 mg by mouth at bedtime as needed for sleep (sleep).     Multiple Vitamins-Minerals (MULTIVITAMIN ADULT) TABS Take 1 tablet by mouth daily with lunch.      mupirocin ointment (BACTROBAN) 2 % Apply 1 application topically to affected area 2 (two) times daily. 22 g 0   NEEDLE, DISP, 22 G 22G X 1" MISC Use for weekly injection 4 each 8   Omega-3 Fatty Acids (FISH OIL) 1000 MG CAPS Take 1,000 mg by mouth daily. As needed     Probiotic CAPS Take 2 capsules by mouth daily.     sacubitril-valsartan (ENTRESTO) 49-51 MG Take 1 tablet by mouth 2 (two) times daily.      spironolactone (ALDACTONE) 25 MG tablet Take 25 mg by mouth daily.     SYRINGE-NEEDLE, DISP, 3 ML (B-D 3CC LUER-LOK SYR 18GX1-1/2) 18G X 1-1/2" 3 ML MISC Use 1 im every week 4 each 3   testosterone cypionate (DEPOTESTOTERONE CYPIONATE) 100 MG/ML injection Inject 80 mg into the muscle every Wednesday.     triamcinolone ointment (KENALOG) 0.1 % Apply 1 application topically to affected area 2 (two) times daily as needed. 454 g 0   No current facility-administered medications for this encounter.   Wt Readings from Last 3 Encounters:  01/20/21 91.3 kg (201 lb 3.2 oz)  12/19/20 92 kg (202 lb 12.8 oz)  11/21/20 90.5 kg (199 lb 9.6 oz)   BP (!) 140/98   Pulse 71   Wt 91.3 kg (201 lb 3.2 oz)   SpO2 98%   BMI 26.55 kg/m   General:  NAD. No resp difficulty HEENT: Normal Neck: Supple. No JVD. Carotids 2+ bilat; no bruits. No lymphadenopathy or thryomegaly appreciated. Cor: PMI nondisplaced. Regular rate & rhythm. No rubs, gallops or murmurs. Lungs: Clear Abdomen: Soft, nontender, nondistended. No hepatosplenomegaly. No bruits or masses. Good bowel sounds. Extremities: No cyanosis, clubbing, rash, edema Neuro: Alert & oriented x 3, cranial nerves grossly intact. Moves all 4 extremities w/o difficulty. Affect pleasant.  Assessment/Plan: 1. Chronic systolic CHF: Echo in 2/21 with EF 35-40%.  LHC in 2/21 with moderate coronary disease, but coronary disease appears out of proportion to the severity of the cardiomyopathy.  It is possible that long-standing HTN and DM have triggered the cardiomyopathy.  Cardiac MRI in 12/21 was a difficult study, showed EF 15% with diffuse hypokinesis, mildly decreased RV systolic function, no LGE.  No evidence for infiltrative disease or myocarditis. Echo 8/22 showed stable EF 30-35%, mildly decreased RV systolic function. NYHA class I-II symptoms, not volume overloaded on exam.  - Increase Coreg to 25 mg bid. - Continue Entresto 49/51 mg bid. BMET today. - Continue  spironolactone 25 mg daily.  - Continue Farxiga 10 mg daily. - EF has been persistently low, will need to consider ICD though with minimal symptoms and nonischemic etiology, benefit may not be as high.  However, he is relatively young and healthy, so I think that ICD placement would be reasonable.  Not CRT candidate with narrow QRS.  He wants to discuss this with his wife and will let me know if he wants EP referral.  2. CAD: Moderate disease, no intervention on 2/21 cath. No chest pain.  - Continue ASA 81 daily.  - Continue atorvastatin, good lipids in 5/22.  3. HTN: BP elevated today, but he has not had his morning medications. 4. CKD stage 3a: Solitary kidney.  Follow carefully.  - BMET today.   Followup with  APP in 6 weeks (consider increasing Entresto) and Dr. Shirlee Latch in 3 months.  Anderson [facility] FNP 01/20/2021

## 2021-01-20 ENCOUNTER — Encounter (HOSPITAL_COMMUNITY): Payer: Self-pay

## 2021-01-20 ENCOUNTER — Ambulatory Visit (HOSPITAL_COMMUNITY)
Admission: RE | Admit: 2021-01-20 | Discharge: 2021-01-20 | Disposition: A | Discharge status: Home / Self Care | Payer: No Typology Code available for payment source | Source: Ambulatory Visit | Attending: Family Medicine | Admitting: Family Medicine

## 2021-01-20 ENCOUNTER — Other Ambulatory Visit: Payer: Self-pay

## 2021-01-20 VITALS — BP 140/98 | HR 71 | Wt 201.2 lb

## 2021-01-20 DIAGNOSIS — I130 Hypertensive heart and chronic kidney disease with heart failure and stage 1 through stage 4 chronic kidney disease, or unspecified chronic kidney disease: Secondary | ICD-10-CM | POA: Insufficient documentation

## 2021-01-20 DIAGNOSIS — Z7984 Long term (current) use of oral hypoglycemic drugs: Secondary | ICD-10-CM | POA: Insufficient documentation

## 2021-01-20 DIAGNOSIS — Z79899 Other long term (current) drug therapy: Secondary | ICD-10-CM | POA: Diagnosis not present

## 2021-01-20 DIAGNOSIS — N1831 Chronic kidney disease, stage 3a: Secondary | ICD-10-CM | POA: Insufficient documentation

## 2021-01-20 DIAGNOSIS — I10 Essential (primary) hypertension: Secondary | ICD-10-CM | POA: Diagnosis not present

## 2021-01-20 DIAGNOSIS — Z7982 Long term (current) use of aspirin: Secondary | ICD-10-CM | POA: Diagnosis not present

## 2021-01-20 DIAGNOSIS — I5022 Chronic systolic (congestive) heart failure: Secondary | ICD-10-CM | POA: Insufficient documentation

## 2021-01-20 DIAGNOSIS — I2510 Atherosclerotic heart disease of native coronary artery without angina pectoris: Secondary | ICD-10-CM | POA: Insufficient documentation

## 2021-01-20 DIAGNOSIS — Z79890 Hormone replacement therapy (postmenopausal): Secondary | ICD-10-CM | POA: Insufficient documentation

## 2021-01-20 DIAGNOSIS — Z905 Acquired absence of kidney: Secondary | ICD-10-CM | POA: Diagnosis not present

## 2021-01-20 DIAGNOSIS — I2584 Coronary atherosclerosis due to calcified coronary lesion: Secondary | ICD-10-CM | POA: Diagnosis not present

## 2021-01-20 DIAGNOSIS — E1122 Type 2 diabetes mellitus with diabetic chronic kidney disease: Secondary | ICD-10-CM | POA: Diagnosis not present

## 2021-01-20 LAB — BASIC METABOLIC PANEL
Anion gap: 6 (ref 5–15)
BUN: 15 mg/dL (ref 8–23)
CO2: 24 mmol/L (ref 22–32)
Calcium: 9.1 mg/dL (ref 8.9–10.3)
Chloride: 106 mmol/L (ref 98–111)
Creatinine, Ser: 1.33 mg/dL — ABNORMAL HIGH (ref 0.61–1.24)
GFR, Estimated: >60 mL/min (ref >60)
Glucose, Bld: 163 mg/dL — ABNORMAL HIGH (ref 70–99)
Potassium: 4.1 mmol/L (ref 3.5–5.1)
Sodium: 136 mmol/L (ref 135–145)

## 2021-01-20 NOTE — Patient Instructions (Signed)
EKG was performed today   Labs were done today, if any labs are abnormal the clinic will call you  Your physician recommends that you schedule a follow-up appointment in: 6 weeks and then in 3 months  At the [facility], you and your health needs are our priority. As part of our continuing mission to provide you with exceptional heart care, we have created designated Provider Care Teams. These Care Teams include your primary Cardiologist (physician) and Advanced Practice Providers (APPs- Physician Assistants and Nurse Practitioners) who all work together to provide you with the care you need, when you need it.   You may see any of the following providers on your designated Care Team at your next follow up: Dr Arvilla Meres Dr Marca Ancona Dr Brandon Melnick, NP Robbie Lis, [place] Mikki Santee Karle Plumber, PharmD   Please be sure to bring in all your medications bottles to every appointment.    If you have any questions or concerns before your next appointment please send [place] a message through [place] or call our office at [phone].    TO LEAVE A MESSAGE FOR THE NURSE SELECT OPTION 2, PLEASE LEAVE A MESSAGE INCLUDING: YOUR NAME DATE OF BIRTH CALL BACK NUMBER REASON FOR CALL**this is important as we prioritize the call backs  YOU WILL RECEIVE A CALL BACK THE SAME DAY AS LONG AS YOU CALL BEFORE 4:00 PM

## 2021-01-22 ENCOUNTER — Other Ambulatory Visit (HOSPITAL_BASED_OUTPATIENT_CLINIC_OR_DEPARTMENT_OTHER): Payer: Self-pay

## 2021-01-30 ENCOUNTER — Other Ambulatory Visit (HOSPITAL_BASED_OUTPATIENT_CLINIC_OR_DEPARTMENT_OTHER): Payer: Self-pay

## 2021-01-31 ENCOUNTER — Other Ambulatory Visit (HOSPITAL_BASED_OUTPATIENT_CLINIC_OR_DEPARTMENT_OTHER): Payer: Self-pay

## 2021-02-03 ENCOUNTER — Other Ambulatory Visit: Payer: Self-pay

## 2021-02-03 ENCOUNTER — Other Ambulatory Visit (HOSPITAL_BASED_OUTPATIENT_CLINIC_OR_DEPARTMENT_OTHER): Payer: Self-pay

## 2021-02-03 MED ORDER — TESTOSTERONE CYPIONATE 100 MG/ML IM SOLN
INTRAMUSCULAR | 1 refills | Status: DC
  Filled 2021-02-03: qty 10, 28d supply, fill #0

## 2021-02-04 ENCOUNTER — Other Ambulatory Visit (HOSPITAL_BASED_OUTPATIENT_CLINIC_OR_DEPARTMENT_OTHER): Payer: Self-pay

## 2021-02-05 ENCOUNTER — Other Ambulatory Visit (HOSPITAL_BASED_OUTPATIENT_CLINIC_OR_DEPARTMENT_OTHER): Payer: Self-pay

## 2021-02-06 ENCOUNTER — Other Ambulatory Visit (HOSPITAL_BASED_OUTPATIENT_CLINIC_OR_DEPARTMENT_OTHER): Payer: Self-pay

## 2021-02-06 ENCOUNTER — Other Ambulatory Visit: Payer: Self-pay | Admitting: Urology

## 2021-02-07 ENCOUNTER — Other Ambulatory Visit (HOSPITAL_BASED_OUTPATIENT_CLINIC_OR_DEPARTMENT_OTHER): Payer: Self-pay

## 2021-02-10 ENCOUNTER — Other Ambulatory Visit (HOSPITAL_BASED_OUTPATIENT_CLINIC_OR_DEPARTMENT_OTHER): Payer: Self-pay

## 2021-02-10 ENCOUNTER — Other Ambulatory Visit (HOSPITAL_COMMUNITY): Payer: Self-pay

## 2021-02-14 ENCOUNTER — Other Ambulatory Visit (HOSPITAL_BASED_OUTPATIENT_CLINIC_OR_DEPARTMENT_OTHER): Payer: Self-pay

## 2021-02-14 MED ORDER — TESTOSTERONE CYPIONATE 200 MG/ML IM SOLN
INTRAMUSCULAR | 1 refills | Status: DC
  Filled 2021-02-14: qty 4, 30d supply, fill #0
  Filled 2021-02-28 (×2): qty 1, 7d supply, fill #0
  Filled 2021-03-14: qty 1, 14d supply, fill #1

## 2021-02-17 ENCOUNTER — Ambulatory Visit (INDEPENDENT_AMBULATORY_CARE_PROVIDER_SITE_OTHER): Payer: No Typology Code available for payment source | Admitting: Cardiology

## 2021-02-17 ENCOUNTER — Other Ambulatory Visit (HOSPITAL_BASED_OUTPATIENT_CLINIC_OR_DEPARTMENT_OTHER): Payer: Self-pay

## 2021-02-17 ENCOUNTER — Encounter: Payer: Self-pay | Admitting: Cardiology

## 2021-02-17 ENCOUNTER — Other Ambulatory Visit: Payer: Self-pay

## 2021-02-17 VITALS — BP 120/74 | HR 66 | Ht 73.0 in | Wt 197.0 lb

## 2021-02-17 DIAGNOSIS — E782 Mixed hyperlipidemia: Secondary | ICD-10-CM | POA: Diagnosis not present

## 2021-02-17 DIAGNOSIS — I429 Cardiomyopathy, unspecified: Secondary | ICD-10-CM | POA: Diagnosis not present

## 2021-02-17 DIAGNOSIS — I10 Essential (primary) hypertension: Secondary | ICD-10-CM

## 2021-02-17 NOTE — Patient Instructions (Signed)
Medication Instructions:  °Your physician recommends that you continue on your current medications as directed. Please refer to the Current Medication list given to you today. ° °*If you need a refill on your cardiac medications before your next appointment, please call your pharmacy* ° ° °Lab Work: °Your physician recommends that you have labs done in the office today. Your test included  basic metabolic panel,  liver function and lipids. ° °If you have labs (blood work) drawn today and your tests are completely normal, you will receive your results only by: °MyChart Message (if you have MyChart) OR °A paper copy in the mail °If you have any lab test that is abnormal or we need to change your treatment, we will call you to review the results. ° ° °Testing/Procedures: °None ordered ° ° °Follow-Up: °At [facility], you and your health needs are our priority.  As part of our continuing mission to provide you with exceptional heart care, we have created designated Provider Care Teams.  These Care Teams include your primary Cardiologist (physician) and Advanced Practice Providers (APPs -  Physician Assistants and Nurse Practitioners) who all work together to provide you with the care you need, when you need it. ° °We recommend signing up for the patient portal called "MyChart".  Sign up information is provided on this After Visit Summary.  MyChart is used to connect with patients for Virtual Visits (Telemedicine).  Patients are able to view lab/test results, encounter notes, upcoming appointments, etc.  Non-urgent messages can be sent to your provider as well.   °To learn more about what you can do with MyChart, go to [email or web address].   ° °Your next appointment:   °6 month(s) ° °The format for your next appointment:   °In Person ° °Provider:   °Rajan Revankar, MD ° ° °Other Instructions °NA   °

## 2021-02-17 NOTE — Progress Notes (Signed)
Cardiology Office Note:    Date:  02/17/2021   ID:  Michael Guerra, DOB 08/03/1960, MRN [id number]  PCP:  Sharlene Dory, DO  Cardiologist:  Garwin Brothers, MD   Referring MD: Sharlene Dory*    ASSESSMENT:    1. Essential hypertension   2. Cardiomyopathy, unspecified type (HCC)   3. Mixed dyslipidemia    PLAN:    In order of problems listed above:  Coronary artery disease and cardiomyopathy: Secondary prevention stressed with the patient.  Importance of compliance with diet medication stressed any vocalized understanding.  He was advised to walk at least half a day 5 days a week on a regular basis and he promises to do so. Essential hypertension: Blood pressure stable and diet was emphasized.  Lifestyle modification urged. Congestive heart failure: Education was given.  Salt intake issues were discussed diet emphasized and he promises to do better. Mixed dyslipidemia: On statin therapy and we will check lipids today diet emphasized. Please follow-up with congestive heart failure clinic.  He is not keen on defibrillator for EP evaluation.  We respect his wishes.  We will monitor his ejection fraction also we will take the advice of my electrophysiology and congestive heart related colleagues for assistance in this matter. Chronic renal insufficiency: Stable and will continue to monitor. Patient will be seen in follow-up appointment in 6 months or earlier if the patient has any concerns    Medication Adjustments/Labs and Tests Ordered: Current medicines are reviewed at length with the patient today.  Concerns regarding medicines are outlined above.  Orders Placed This Encounter  Procedures   Basic metabolic panel   Hepatic function panel   Lipid panel   No orders of the defined types were placed in this encounter.    Chief Complaint  Patient presents with   Follow-up     History of Present Illness:    Michael Guerra is a 60 y.o. male.  Patient has  past medical history of ischemic cardiomyopathy, essential hypertension dyslipidemia.  He denies any problems at this time and takes care of activities of daily living.  He is followed by the heart failure clinic.  His ejection fraction last noted was 30 to 35%.  He denies any chest pain orthopnea or PND.  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  At the time of my evaluation, the patient is alert awake oriented and in no distress.  Past Medical History:  Diagnosis Date   CAD (coronary artery disease) 06/08/2019   Cardiomyopathy (HCC) 05/23/2019   Cataract    Chest discomfort 05/12/2019   CHF (congestive heart failure) (HCC)    Chronic kidney disease    one kidney that is healthy   Chronic left shoulder pain 02/05/2020   Diabetes mellitus due to underlying condition with unspecified complications (HCC) 05/12/2019   Essential hypertension 05/12/2019   Foot sprain, left, initial encounter 06/22/2019   LIVER FUNCTION TESTS, ABNORMAL 05/21/2006   Qualifier: Diagnosis of  By: Maple Hudson MD, Riki Rusk     Mixed dyslipidemia 05/12/2019   Other specified abnormal findings of blood chemistry 05/20/2011   Formatting of this note might be different from the original. Elevated liver function tests 10/1 IMO update   Rectal polyp 05/20/2011   Formatting of this note might be different from the original. Noted march 2011 fu in march 2016   Renal insufficiency 05/23/2019   Skin lesion of chest wall 05/20/2011   Formatting  of this note might be different from the original. Mid chest (prob seb/epidermoid cyst)   Solitary kidney    Type 2 diabetes mellitus with hyperglycemia, without long-term current use of insulin ([place]) 02/17/2006   Qualifier: Diagnosis of  By: Annamaria Boots MD, Ysidro Evert      Past Surgical History:  Procedure Laterality Date   ABDOMINAL SURGERY     KIDNEY SURGERY     bowel obstruction  x 3   LEFT HEART CATH AND CORONARY ANGIOGRAPHY N/A 05/31/2019   Procedure: LEFT HEART CATH AND  CORONARY ANGIOGRAPHY;  Surgeon: Wellington Hampshire, MD;  Location: [facility];  Service: Cardiovascular;  Laterality: N/A;    Current Medications: Current Meds  Medication Sig   acetaminophen (TYLENOL) 500 MG tablet Take 1,000 mg by mouth every 6 (six) hours as needed for moderate pain or headache.   aspirin 81 MG tablet Take 81 mg by mouth daily.   atorvastatin (LIPITOR) 40 MG tablet Take 1 tablet (40 mg total) by mouth daily.   carvedilol (COREG) 25 MG tablet Take 1 tablet (25 mg total) by mouth 2 (two) times daily with a meal.   dapagliflozin propanediol (FARXIGA) 10 MG TABS tablet Take 1 tablet (10 mg total) by mouth daily.   Melatonin 10 MG CAPS Take 30 mg by mouth at bedtime as needed for sleep (sleep).   Multiple Vitamins-Minerals (MULTIVITAMIN ADULT) TABS Take 1 tablet by mouth daily with lunch.    mupirocin ointment (BACTROBAN) 2 % Apply 1 application topically to affected area 2 (two) times daily.   Omega-3 Fatty Acids (FISH OIL) 1000 MG CAPS Take 1,000 mg by mouth daily.   Probiotic CAPS Take 2 capsules by mouth daily.   sacubitril-valsartan (ENTRESTO) 49-51 MG Take 1 tablet by mouth 2 (two) times daily.   sacubitril-valsartan (ENTRESTO) 49-51 MG Take 1 tablet by mouth 2 (two) times daily. *DISCONTINUE AMLODIPINE*   spironolactone (ALDACTONE) 25 MG tablet Take 25 mg by mouth daily.   testosterone cypionate (DEPOTESTOSTERONE CYPIONATE) 200 MG/ML injection Inject 0.5 ml intramuscularly once a week   triamcinolone ointment (KENALOG) 0.1 % Apply 1 application topically 2 (two) times daily as needed for rash.     Allergies:   Lisinopril   Social History   Socioeconomic History   Marital status: Married    Spouse name: Not on file   Number of children: Not on file   Years of education: Not on file   Highest education level: Not on file  Occupational History   Not on file  Tobacco Use   Smoking status: Never   Smokeless tobacco: Never  Substance and Sexual Activity    Alcohol use: No   Drug use: No   Sexual activity: Not on file  Other Topics Concern   Not on file  Social History Narrative   Not on file   Social Determinants of Health   Financial Resource Strain: Not on file  Food Insecurity: Not on file  Transportation Needs: Not on file  Physical Activity: Not on file  Stress: Not on file  Social Connections: Not on file     Family History: The patient's family history is negative for Cancer, Colon cancer, Esophageal cancer, Rectal cancer, and Stomach cancer.  ROS:   Please see the history of present illness.    All other systems reviewed and are negative.  EKGs/Labs/Other Studies Reviewed:    The following studies were reviewed today: IMPRESSIONS     1. Left ventricular ejection fraction, by estimation,  is 30 to 35%. The  left ventricle has moderately decreased function. The left ventricle  demonstrates global hypokinesis. Left ventricular diastolic parameters are  consistent with Grade I diastolic  dysfunction (impaired relaxation).   2. Right ventricular systolic function is mildly reduced. The right  ventricular size is normal. Tricuspid regurgitation signal is inadequate  for assessing PA pressure.   3. The mitral valve is normal in structure. Trivial mitral valve  regurgitation. No evidence of mitral stenosis.   4. The aortic valve is tricuspid. Aortic valve regurgitation is not  visualized. Mild aortic valve sclerosis is present, with no evidence of  aortic valve stenosis.   5. The inferior vena cava is normal in size with greater than 50%  respiratory variability, suggesting right atrial pressure of 3 mmHg.    Recent Labs: 09/02/2020: ALT 31 01/20/2021: BUN 15; Creatinine, Ser 1.33; Potassium 4.1; Sodium 136  Recent Lipid Panel    Component Value Date/Time   CHOL 84 09/02/2020 0733   CHOL 115 10/18/2019 0836   TRIG 78.0 09/02/2020 0733   HDL 36.10 (L) 09/02/2020 0733   HDL 37 (L) 10/18/2019 0836   CHOLHDL 2  09/02/2020 0733   VLDL 15.6 09/02/2020 0733   LDLCALC 33 09/02/2020 0733   LDLCALC 60 10/18/2019 0836   LDLDIRECT 97.0 10/18/2017 0941    Physical Exam:    VS:  BP 120/74 (BP Location: Right Arm, Patient Position: Sitting, Cuff Size: Normal)   Pulse 66   Ht 6\' 1"  (1.854 m)   Wt 197 lb (89.4 kg)   SpO2 98%   BMI 25.99 kg/m     Wt Readings from Last 3 Encounters:  02/17/21 197 lb (89.4 kg)  01/20/21 201 lb 3.2 oz (91.3 kg)  12/19/20 202 lb 12.8 oz (92 kg)     GEN: Patient is in no acute distress HEENT: Normal NECK: No JVD; No carotid bruits LYMPHATICS: No lymphadenopathy CARDIAC: Hear sounds regular, 2/6 systolic murmur at the apex. RESPIRATORY:  Clear to auscultation without rales, wheezing or rhonchi  ABDOMEN: Soft, non-tender, non-distended MUSCULOSKELETAL:  No edema; No deformity  SKIN: Warm and dry NEUROLOGIC:  Alert and oriented x 3 PSYCHIATRIC:  Normal affect   Signed, Jenean Lindau, MD  02/17/2021 8:54 AM    [place]

## 2021-02-18 LAB — BASIC METABOLIC PANEL
BUN/Creatinine Ratio: 21 (ref 10–24)
BUN: 28 mg/dL — ABNORMAL HIGH (ref 8–27)
CO2: 20 mmol/L (ref 20–29)
Calcium: 9.6 mg/dL (ref 8.6–10.2)
Chloride: 105 mmol/L (ref 96–106)
Creatinine, Ser: 1.33 mg/dL — ABNORMAL HIGH (ref 0.76–1.27)
Glucose: 188 mg/dL — ABNORMAL HIGH (ref 70–99)
Potassium: 4.8 mmol/L (ref 3.5–5.2)
Sodium: 141 mmol/L (ref 134–144)
eGFR: 60 mL/min/{1.73_m2} (ref >59)

## 2021-02-18 LAB — LIPID PANEL
Chol/HDL Ratio: 2.7 ratio (ref 0.0–5.0)
Cholesterol, Total: 130 mg/dL (ref 100–199)
HDL: 48 mg/dL (ref >39)
LDL Chol Calc (NIH): 66 mg/dL (ref 0–99)
Triglycerides: 85 mg/dL (ref 0–149)
VLDL Cholesterol Cal: 16 mg/dL (ref 5–40)

## 2021-02-18 LAB — HEPATIC FUNCTION PANEL
ALT: 42 IU/L (ref 0–44)
AST: 40 IU/L (ref 0–40)
Albumin: 4.8 g/dL (ref 3.8–4.8)
Alkaline Phosphatase: 81 IU/L (ref 44–121)
Bilirubin Total: 2 mg/dL — ABNORMAL HIGH (ref 0.0–1.2)
Bilirubin, Direct: 0.36 mg/dL (ref 0.00–0.40)
Total Protein: 7.2 g/dL (ref 6.0–8.5)

## 2021-02-19 ENCOUNTER — Other Ambulatory Visit (HOSPITAL_BASED_OUTPATIENT_CLINIC_OR_DEPARTMENT_OTHER): Payer: Self-pay

## 2021-02-20 ENCOUNTER — Other Ambulatory Visit (HOSPITAL_BASED_OUTPATIENT_CLINIC_OR_DEPARTMENT_OTHER): Payer: Self-pay

## 2021-02-21 ENCOUNTER — Other Ambulatory Visit (HOSPITAL_BASED_OUTPATIENT_CLINIC_OR_DEPARTMENT_OTHER): Payer: Self-pay

## 2021-02-26 ENCOUNTER — Other Ambulatory Visit (HOSPITAL_BASED_OUTPATIENT_CLINIC_OR_DEPARTMENT_OTHER): Payer: Self-pay

## 2021-02-28 ENCOUNTER — Other Ambulatory Visit (HOSPITAL_BASED_OUTPATIENT_CLINIC_OR_DEPARTMENT_OTHER): Payer: Self-pay

## 2021-02-28 MED ORDER — TESTOSTERONE CYPIONATE 200 MG/ML IM SOLN
100.0000 mg | INTRAMUSCULAR | 1 refills | Status: DC
  Filled 2021-02-28: qty 6, 84d supply, fill #0

## 2021-03-03 ENCOUNTER — Other Ambulatory Visit (HOSPITAL_BASED_OUTPATIENT_CLINIC_OR_DEPARTMENT_OTHER): Payer: Self-pay

## 2021-03-03 ENCOUNTER — Other Ambulatory Visit: Payer: Self-pay

## 2021-03-03 ENCOUNTER — Ambulatory Visit (HOSPITAL_COMMUNITY)
Admission: RE | Admit: 2021-03-03 | Discharge: 2021-03-03 | Disposition: A | Discharge status: Home / Self Care | Payer: No Typology Code available for payment source | Source: Ambulatory Visit | Attending: Family Medicine | Admitting: Family Medicine

## 2021-03-03 ENCOUNTER — Encounter (HOSPITAL_COMMUNITY): Payer: Self-pay

## 2021-03-03 VITALS — BP 124/80 | HR 69 | Wt 199.0 lb

## 2021-03-03 DIAGNOSIS — Z905 Acquired absence of kidney: Secondary | ICD-10-CM | POA: Diagnosis not present

## 2021-03-03 DIAGNOSIS — Z79899 Other long term (current) drug therapy: Secondary | ICD-10-CM | POA: Insufficient documentation

## 2021-03-03 DIAGNOSIS — I429 Cardiomyopathy, unspecified: Secondary | ICD-10-CM | POA: Insufficient documentation

## 2021-03-03 DIAGNOSIS — Z7984 Long term (current) use of oral hypoglycemic drugs: Secondary | ICD-10-CM | POA: Diagnosis not present

## 2021-03-03 DIAGNOSIS — E1122 Type 2 diabetes mellitus with diabetic chronic kidney disease: Secondary | ICD-10-CM | POA: Diagnosis not present

## 2021-03-03 DIAGNOSIS — I130 Hypertensive heart and chronic kidney disease with heart failure and stage 1 through stage 4 chronic kidney disease, or unspecified chronic kidney disease: Secondary | ICD-10-CM | POA: Diagnosis present

## 2021-03-03 DIAGNOSIS — Z7982 Long term (current) use of aspirin: Secondary | ICD-10-CM | POA: Diagnosis not present

## 2021-03-03 DIAGNOSIS — I5022 Chronic systolic (congestive) heart failure: Secondary | ICD-10-CM | POA: Insufficient documentation

## 2021-03-03 DIAGNOSIS — N1831 Chronic kidney disease, stage 3a: Secondary | ICD-10-CM | POA: Insufficient documentation

## 2021-03-03 DIAGNOSIS — I2510 Atherosclerotic heart disease of native coronary artery without angina pectoris: Secondary | ICD-10-CM | POA: Insufficient documentation

## 2021-03-03 DIAGNOSIS — I10 Essential (primary) hypertension: Secondary | ICD-10-CM | POA: Diagnosis not present

## 2021-03-03 LAB — BASIC METABOLIC PANEL
Anion gap: 8 (ref 5–15)
BUN: 21 mg/dL (ref 8–23)
CO2: 25 mmol/L (ref 22–32)
Calcium: 9.2 mg/dL (ref 8.9–10.3)
Chloride: 104 mmol/L (ref 98–111)
Creatinine, Ser: 1.34 mg/dL — ABNORMAL HIGH (ref 0.61–1.24)
GFR, Estimated: 60 mL/min — ABNORMAL LOW (ref >60)
Glucose, Bld: 168 mg/dL — ABNORMAL HIGH (ref 70–99)
Potassium: 4.2 mmol/L (ref 3.5–5.1)
Sodium: 137 mmol/L (ref 135–145)

## 2021-03-03 MED ORDER — SPIRONOLACTONE 25 MG PO TABS
12.5000 mg | ORAL_TABLET | Freq: Every day | ORAL | 2 refills | Status: DC
  Filled 2021-03-03: qty 30, 60d supply, fill #0
  Filled 2021-04-16 – 2021-04-24 (×3): qty 30, 60d supply, fill #1

## 2021-03-03 MED ORDER — SPIRONOLACTONE 25 MG PO TABS
25.0000 mg | ORAL_TABLET | Freq: Every day | ORAL | 3 refills | Status: DC
  Filled 2021-03-03: qty 30, 30d supply, fill #0

## 2021-03-03 NOTE — Progress Notes (Signed)
PCP: Shelda Pal, DO Cardiology: Dr. Geraldo Pitter HF Cardiology: Dr. Evans Lance Michael Guerra is a 60 y.o. with history of CKD stage 3, HTN, and diabetes was referred for evaluation of CHF by Dr. Geraldo Pitter.  Patient has had diabetes and HTN for about 10-15 years per his report.  He has CKD stage 3 in setting of nephrectomy done in childhood due to trauma, follows with nephrology.  He developed atypical chest pain in 2/21 and had Cardiolite done.  This showed EF 23% with fixed inferior defect.  Echo showed EF 35-40%, mild LV dilation, no LVH, normal RV.  He then had LHC done, showing OM1 65% stenosis, distal LAD 70% stenosis.  Cardiomyopathy was thought to be out of proportion to CAD. No recent viral-type infection symptoms.  No strong family history of CHF.    Cardiac MRI in 12/21 was a difficult study, EF 15% with diffuse hypokinesis, mildly decreased RV systolic function, no LGE.    Echo in 3/22 showed EF 30-35%, diffuse hypokinesis, normal RV, normal IVC.    Echo 8/22 EF 30-35% with mildly decreased RV function, normal IVC.   Today he returns for HF follow up. No significant SOB with activity or with his work as Presenter, broadcasting at [employer]. Overall feeling fine. Denies CP, dizziness, edema, or PND/Orthopnea. Appetite ok. No fever or chills. Weight at home 192 pounds. Has been off spiro x 3 weeks due to issues at pharmacy. Has not decided if he wants to pursue ICD yet.  ECG (personally reviewed): None ordered today.  Labs (6/21): LDL 60, K 4.6, creatinine 1.5 Labs (1/22): K 4.5, creatinine 1.46 Labs (3/22): K 4.7, creatinine 1.4 Labs (5/22): K 4.1, creatinine 1.45, LDL 33 Labs (8/22): K 4.2, creatinine 1.45 Labs (10/22): K 4.8, creatinine 1.33, LDL 66  PMH: 1. CKD stage 3: H/o nephrectomy in childhood post-trauma.  2. HTN - Renal artery dopplers (9/21): No significant stenosis.  3. Hyperlipidemia 4. Type 2 diabetes 5. CAD: LHC (2/21) with OM1 65% stenosis, distal LAD 70% stenosis.   6. Chronic systolic CHF: Nonischemic cardiomyopathy, cardiomyopathy out of proportion to CAD.  - Echo (2/21) with EF 35-40%, mild LV dilation, no LVH, normal RV.  - Cardiac MRI (12/21): Difficult study, EF 15% with diffuse hypokinesis, mildly decreased RV systolic function, no LGE.   - Echo (3/22): EF 30-35%, diffuse hypokinesis, normal RV, normal IVC.  - Echo (8/22): EF 30-35% with mildly decreased RV function, normal IVC.   SH: Works as a Presenter, broadcasting at [employer], married, 1 child, no ETOH or smoking.   FH: Mother with diabetes, possible CHF.   ROS: All systems reviewed and negative except as per HPI.   Current Outpatient Medications  Medication Sig Dispense Refill   acetaminophen (TYLENOL) 500 MG tablet Take 1,000 mg by mouth every 6 (six) hours as needed for moderate pain or headache.     aspirin 81 MG tablet Take 81 mg by mouth daily.     atorvastatin (LIPITOR) 40 MG tablet Take 1 tablet (40 mg total) by mouth daily. 90 tablet 1   carvedilol (COREG) 25 MG tablet Take 1 tablet (25 mg total) by mouth 2 (two) times daily with a meal. 180 tablet 3   dapagliflozin propanediol (FARXIGA) 10 MG TABS tablet Take 1 tablet (10 mg total) by mouth daily. 30 tablet 11   Melatonin 10 MG CAPS Take 30 mg by mouth at bedtime as needed for sleep (sleep).     Multiple Vitamins-Minerals (MULTIVITAMIN ADULT)  TABS Take 1 tablet by mouth daily with lunch.      mupirocin ointment (BACTROBAN) 2 % Apply 1 application topically to affected area 2 (two) times daily. 22 g 0   Omega-3 Fatty Acids (FISH OIL) 1000 MG CAPS Take 1,000 mg by mouth daily.     Probiotic CAPS Take 2 capsules by mouth daily.     sacubitril-valsartan (ENTRESTO) 49-51 MG Take 1 tablet by mouth 2 (two) times daily.     testosterone cypionate (DEPOTESTOSTERONE CYPIONATE) 200 MG/ML injection Inject 0.5 ml intramuscularly once a week 10 mL 1   triamcinolone ointment (KENALOG) 0.1 % Apply 1 application topically 2 (two) times daily as needed  for rash.     spironolactone (ALDACTONE) 25 MG tablet Take 25 mg by mouth daily. (Patient not taking: Reported on 03/03/2021)     No current facility-administered medications for this encounter.   Wt Readings from Last 3 Encounters:  03/03/21 90.3 kg (199 lb)  02/17/21 89.4 kg (197 lb)  01/20/21 91.3 kg (201 lb 3.2 oz)   BP 124/80   Pulse 69   Wt 90.3 kg (199 lb)   SpO2 99%   BMI 26.25 kg/m   General:  NAD. No resp difficulty HEENT: Normal Neck: Supple. No JVD. Carotids 2+ bilat; no bruits. No lymphadenopathy or thryomegaly appreciated. Cor: PMI nondisplaced. Regular rate & rhythm. No rubs, gallops or murmurs. Lungs: Clear Abdomen: Soft, nontender, nondistended. No hepatosplenomegaly. No bruits or masses. Good bowel sounds. Extremities: No cyanosis, clubbing, rash, edema Neuro: Alert & oriented x 3, cranial nerves grossly intact. Moves all 4 extremities w/o difficulty. Affect pleasant.  Assessment/Plan: 1. Chronic systolic CHF: Echo in 2/21 with EF 35-40%.  LHC in 2/21 with moderate coronary disease, but coronary disease appears out of proportion to the severity of the cardiomyopathy.  It is possible that long-standing HTN and DM have triggered the cardiomyopathy.  Cardiac MRI in 12/21 was a difficult study, showed EF 15% with diffuse hypokinesis, mildly decreased RV systolic function, no LGE.  No evidence for infiltrative disease or myocarditis. Echo 8/22 showed stable EF 30-35%, mildly decreased RV systolic function. NYHA class I-II symptoms, not volume overloaded on exam.  - Restart spiro 12.5 mg daily. BMET today, repeat 1 week and 4 weeks. - Continue Coreg 25 mg bid. - Continue Entresto 49/51 mg bid. - Continue Farxiga 10 mg daily. - EF has been persistently low, will need to consider ICD though with minimal symptoms and nonischemic etiology, benefit may not be as high.  However, he is relatively young and healthy, so I think that ICD placement would be reasonable.  Not CRT  candidate with narrow QRS.  He wants to discuss this with his wife and will let me know if he wants EP referral.  2. CAD: Moderate disease, no intervention on 2/21 cath. No chest pain.  - Continue ASA 81 daily.  - Continue atorvastatin, good lipids in 5/22.  3. HTN: Controlled today. Add spiro back as above. 4. CKD stage 3a: Solitary kidney.  Follow carefully.  - BMET today.   Followup with Dr. Shirlee Latch in 2 months.  [facility] FNP 03/03/2021

## 2021-03-03 NOTE — Patient Instructions (Addendum)
Labs were done today, if any labs are abnormal the clinic will call you  RESTART Spironolactone 12.5 mg 1/2 tablet daily   Your physician recommends that you schedule a follow-up appointment in: 2 months with Dr. Shirlee Latch  Your physician recommends that you return for lab work in: 1 week and 4 weeks   At [facility], you and your health needs are our priority. As part of our continuing mission to provide you with exceptional heart care, we have created designated Provider Care Teams. These Care Teams include your primary Cardiologist (physician) and Advanced Practice Providers (APPs- Physician Assistants and Nurse Practitioners) who all work together to provide you with the care you need, when you need it.   You may see any of the following providers on your designated Care Team at your next follow up: Dr Arvilla Meres Dr Carron Curie, NP Robbie Lis, [place] [facility] [place], [place] Karle Plumber, PharmD   Please be sure to bring in all your medications bottles to every appointment.   If you have any questions or concerns before your next appointment please send [place] a message through [place] or call our office at [phone].    TO LEAVE A MESSAGE FOR THE NURSE SELECT OPTION 2, PLEASE LEAVE A MESSAGE INCLUDING: YOUR NAME DATE OF BIRTH CALL BACK NUMBER REASON FOR CALL**this is important as we prioritize the call backs  YOU WILL RECEIVE A CALL BACK THE SAME DAY AS LONG AS YOU CALL BEFORE 4:00 PM

## 2021-03-04 ENCOUNTER — Encounter: Payer: Self-pay | Admitting: Family Medicine

## 2021-03-04 ENCOUNTER — Other Ambulatory Visit: Payer: Self-pay | Admitting: Family Medicine

## 2021-03-04 ENCOUNTER — Other Ambulatory Visit (HOSPITAL_BASED_OUTPATIENT_CLINIC_OR_DEPARTMENT_OTHER): Payer: Self-pay

## 2021-03-04 ENCOUNTER — Ambulatory Visit (INDEPENDENT_AMBULATORY_CARE_PROVIDER_SITE_OTHER): Payer: No Typology Code available for payment source | Admitting: Family Medicine

## 2021-03-04 VITALS — BP 120/76 | HR 68 | Temp 98.3°F | Ht 73.0 in | Wt 198.5 lb

## 2021-03-04 DIAGNOSIS — Z125 Encounter for screening for malignant neoplasm of prostate: Secondary | ICD-10-CM | POA: Diagnosis not present

## 2021-03-04 DIAGNOSIS — Z23 Encounter for immunization: Secondary | ICD-10-CM | POA: Diagnosis not present

## 2021-03-04 DIAGNOSIS — Z0000 Encounter for general adult medical examination without abnormal findings: Secondary | ICD-10-CM | POA: Diagnosis not present

## 2021-03-04 DIAGNOSIS — E1165 Type 2 diabetes mellitus with hyperglycemia: Secondary | ICD-10-CM

## 2021-03-04 LAB — LIPID PANEL
Cholesterol: 122 mg/dL (ref 0–200)
HDL: 52.4 mg/dL (ref >39.00)
LDL Cholesterol: 51 mg/dL (ref 0–99)
NonHDL: 69.77
Total CHOL/HDL Ratio: 2
Triglycerides: 96 mg/dL (ref 0.0–149.0)
VLDL: 19.2 mg/dL (ref 0.0–40.0)

## 2021-03-04 LAB — HEMOGLOBIN A1C: Hgb A1c MFr Bld: 9.1 % — ABNORMAL HIGH (ref 4.6–6.5)

## 2021-03-04 LAB — COMPREHENSIVE METABOLIC PANEL
ALT: 36 U/L (ref 0–53)
AST: 35 U/L (ref 0–37)
Albumin: 4.3 g/dL (ref 3.5–5.2)
Alkaline Phosphatase: 63 U/L (ref 39–117)
BUN: 21 mg/dL (ref 6–23)
CO2: 27 mEq/L (ref 19–32)
Calcium: 9.4 mg/dL (ref 8.4–10.5)
Chloride: 102 mEq/L (ref 96–112)
Creatinine, Ser: 1.35 mg/dL (ref 0.40–1.50)
GFR: 56.15 mL/min — ABNORMAL LOW (ref >60.00)
Glucose, Bld: 177 mg/dL — ABNORMAL HIGH (ref 70–99)
Potassium: 4.4 mEq/L (ref 3.5–5.1)
Sodium: 135 mEq/L (ref 135–145)
Total Bilirubin: 2.3 mg/dL — ABNORMAL HIGH (ref 0.2–1.2)
Total Protein: 6.9 g/dL (ref 6.0–8.3)

## 2021-03-04 LAB — MICROALBUMIN / CREATININE URINE RATIO
Creatinine,U: 78.5 mg/dL
Microalb Creat Ratio: 0.9 mg/g (ref 0.0–30.0)
Microalb, Ur: <0.7 mg/dL (ref 0.0–1.9)

## 2021-03-04 LAB — CBC
HCT: 45.3 % (ref 39.0–52.0)
Hemoglobin: 15.2 g/dL (ref 13.0–17.0)
MCHC: 33.5 g/dL (ref 30.0–36.0)
MCV: 89.3 fl (ref 78.0–100.0)
Platelets: 141 10*3/uL — ABNORMAL LOW (ref 150.0–400.0)
RBC: 5.07 Mil/uL (ref 4.22–5.81)
RDW: 13.2 % (ref 11.5–15.5)
WBC: 2.6 10*3/uL — ABNORMAL LOW (ref 4.0–10.5)

## 2021-03-04 LAB — PSA: PSA: 0.35 ng/mL (ref 0.10–4.00)

## 2021-03-04 MED ORDER — METFORMIN HCL ER 500 MG PO TB24
1000.0000 mg | ORAL_TABLET | Freq: Every day | ORAL | 3 refills | Status: DC
  Filled 2021-03-04: qty 60, 30d supply, fill #0
  Filled 2021-04-16: qty 60, 30d supply, fill #1
  Filled 2021-05-27: qty 60, 30d supply, fill #2

## 2021-03-04 NOTE — Patient Instructions (Addendum)
Give us 2-3 business days to get the results of your labs back.   Keep the diet clean and stay active.  The new Shingrix vaccine (for shingles) is a 2 shot series. It can make people feel low energy, achy and almost like they have the flu for 48 hours after injection. Please plan accordingly when deciding on when to get this shot. Call our office for a nurse visit appointment to get this. The second shot of the series is less severe regarding the side effects, but it still lasts 48 hours.   I recommend getting the updated bivalent covid vaccination booster at your convenience.   Let us know if you need anything. 

## 2021-03-04 NOTE — Progress Notes (Signed)
Chief Complaint  Patient presents with   Annual Exam    Well Male Michael Guerra is here for a complete physical.   His last physical was >1 year ago.  Current diet: in general, diet has been better Current exercise: active at work Weight trend: stable Fatigue out of ordinary? No. Seat belt? Yes.    Health maintenance Shingrix- No Colonoscopy- Yes Tetanus- Yes HIV- Yes Hep C- Yes   Past Medical History:  Diagnosis Date   CAD (coronary artery disease) 06/08/2019   Cardiomyopathy (HCC) 05/23/2019   Cataract    Chest discomfort 05/12/2019   CHF (congestive heart failure) (HCC)    Chronic kidney disease    one kidney that is healthy   Chronic left shoulder pain 02/05/2020   Diabetes mellitus due to underlying condition with unspecified complications (HCC) 05/12/2019   Essential hypertension 05/12/2019   Foot sprain, left, initial encounter 06/22/2019   LIVER FUNCTION TESTS, ABNORMAL 05/21/2006   Qualifier: Diagnosis of  By: Maple Hudson MD, Riki Rusk     Mixed dyslipidemia 05/12/2019   Other specified abnormal findings of blood chemistry 05/20/2011   Formatting of this note might be different from the original. Elevated liver function tests 10/1 IMO update   Rectal polyp 05/20/2011   Formatting of this note might be different from the original. Noted march 2011 fu in march 2016   Renal insufficiency 05/23/2019   Skin lesion of chest wall 05/20/2011   Formatting of this note might be different from the original. Mid chest (prob seb/epidermoid cyst)   Solitary kidney    Type 2 diabetes mellitus with hyperglycemia, without long-term current use of insulin (HCC) 02/17/2006   Qualifier: Diagnosis of  By: Maple Hudson MD, Riki Rusk        Past Surgical History:  Procedure Laterality Date   ABDOMINAL SURGERY     KIDNEY SURGERY     bowel obstruction  x 3   LEFT HEART CATH AND CORONARY ANGIOGRAPHY N/A 05/31/2019   Procedure: LEFT HEART CATH AND CORONARY ANGIOGRAPHY;  Surgeon: Iran Ouch, MD;  Location:  [facility];  Service: Cardiovascular;  Laterality: N/A;    Medications  Current Outpatient Medications on File Prior to Visit  Medication Sig Dispense Refill   acetaminophen (TYLENOL) 500 MG tablet Take 1,000 mg by mouth every 6 (six) hours as needed for moderate pain or headache.     aspirin 81 MG tablet Take 81 mg by mouth daily.     atorvastatin (LIPITOR) 40 MG tablet Take 1 tablet (40 mg total) by mouth daily. 90 tablet 1   carvedilol (COREG) 25 MG tablet Take 1 tablet (25 mg total) by mouth 2 (two) times daily with a meal. 180 tablet 3   dapagliflozin propanediol (FARXIGA) 10 MG TABS tablet Take 1 tablet (10 mg total) by mouth daily. 30 tablet 11   Melatonin 10 MG CAPS Take 30 mg by mouth at bedtime as needed for sleep (sleep).     Multiple Vitamins-Minerals (MULTIVITAMIN ADULT) TABS Take 1 tablet by mouth daily with lunch.      mupirocin ointment (BACTROBAN) 2 % Apply 1 application topically to affected area 2 (two) times daily. 22 g 0   Omega-3 Fatty Acids (FISH OIL) 1000 MG CAPS Take 1,000 mg by mouth daily.     Probiotic CAPS Take 2 capsules by mouth daily.     sacubitril-valsartan (ENTRESTO) 49-51 MG Take 1 tablet by mouth 2 (two) times daily.     spironolactone (ALDACTONE) 25 MG  tablet Take 1/2 tablet (12.5 mg total) by mouth daily. 30 tablet 2   testosterone cypionate (DEPOTESTOSTERONE CYPIONATE) 200 MG/ML injection Inject 0.5 ml intramuscularly once a week 10 mL 1   triamcinolone ointment (KENALOG) 0.1 % Apply 1 application topically 2 (two) times daily as needed for rash.     Allergies Allergies  Allergen Reactions   Lisinopril Cough    Family History Family History  Problem Relation Age of Onset   Cancer Neg Hx    Colon cancer Neg Hx    Esophageal cancer Neg Hx    Rectal cancer Neg Hx    Stomach cancer Neg Hx     Review of Systems: Constitutional:  no fevers Eye:  no recent significant change in vision Ear/Nose/Mouth/Throat:  Ears:  no hearing  loss Nose/Mouth/Throat:  no complaints of nasal congestion, no sore throat Cardiovascular:  no chest pain Respiratory:  no shortness of breath Gastrointestinal:  no change in bowel habits GU:  Male: negative for dysuria, frequency Musculoskeletal/Extremities:  no joint pain Integumentary (Skin/Breast):  no abnormal skin lesions reported Neurologic:  no headaches Endocrine: No unexpected weight changes Hematologic/Lymphatic:  no abnormal bleeding  Exam BP 120/76   Pulse 68   Temp 98.3 F (36.8 C) (Oral)   Ht 6\' 1"  (1.854 m)   Wt 198 lb 8 oz (90 kg)   SpO2 99%   BMI 26.19 kg/m  General:  well developed, well nourished, in no apparent distress Skin:  no significant moles, warts, or growths Head:  no masses, lesions, or tenderness Eyes:  pupils equal and round, sclera anicteric without injection Ears:  canals without lesions, TMs shiny without retraction, no obvious effusion, no erythema Nose:  nares patent, septum midline, mucosa normal Throat/Pharynx:  lips and gingiva without lesion; tongue and uvula midline; non-inflamed pharynx; no exudates or postnasal drainage Neck: neck supple without adenopathy, thyromegaly, or masses Cardiac: RRR, no bruits, no LE edema Lungs:  clear to auscultation, breath sounds equal bilaterally, no respiratory distress Abdomen: BS+, soft, non-tender, non-distended, no masses or organomegaly noted Rectal: Deferred Musculoskeletal:  symmetrical muscle groups noted without atrophy or deformity Neuro:  gait normal; deep tendon reflexes normal and symmetric Psych: well oriented with normal range of affect and appropriate judgment/insight  Assessment and Plan  Well adult exam - Plan: CBC, Comprehensive metabolic panel, Lipid panel  Type 2 diabetes mellitus with hyperglycemia, without long-term current use of insulin (HCC) - Plan: Hemoglobin A1c, Microalbumin / creatinine urine ratio  Screening for prostate cancer - Plan: PSA   Well 60 y.o.  male. Counseled on diet and exercise. Counseled on risks and benefits of prostate cancer screening with PSA. The patient agrees to undergo testing. Immunizations, labs, and further orders as above. If DM not controlled, will hopefully be able to add back Metformin. If not, will see if he is interested in [place].  Shingrix rec'd.  PCV20 today.  Covid bivalent booster rec'd.  Follow up in 6 mo for DM visit. . The patient voiced understanding and agreement to the plan.  [place], DO 03/04/21 8:12 AM

## 2021-03-05 ENCOUNTER — Other Ambulatory Visit (HOSPITAL_BASED_OUTPATIENT_CLINIC_OR_DEPARTMENT_OTHER): Payer: Self-pay

## 2021-03-06 ENCOUNTER — Encounter: Payer: Self-pay | Admitting: Family Medicine

## 2021-03-06 ENCOUNTER — Other Ambulatory Visit (HOSPITAL_COMMUNITY): Payer: Self-pay

## 2021-03-10 ENCOUNTER — Other Ambulatory Visit: Payer: Self-pay | Admitting: Family Medicine

## 2021-03-10 ENCOUNTER — Other Ambulatory Visit (HOSPITAL_BASED_OUTPATIENT_CLINIC_OR_DEPARTMENT_OTHER): Payer: Self-pay

## 2021-03-10 ENCOUNTER — Other Ambulatory Visit (HOSPITAL_COMMUNITY): Payer: Self-pay

## 2021-03-10 MED ORDER — FREESTYLE LITE W/DEVICE KIT
PACK | 0 refills | Status: AC
  Filled 2021-03-10: qty 1, 1d supply, fill #0

## 2021-03-10 MED ORDER — FREESTYLE LITE TEST VI STRP
ORAL_STRIP | 3 refills | Status: AC
  Filled 2021-03-10: qty 100, 90d supply, fill #0
  Filled 2021-12-12: qty 50, 50d supply, fill #1

## 2021-03-10 MED ORDER — FREESTYLE LANCETS MISC
3 refills | Status: AC
  Filled 2021-03-10: qty 100, 90d supply, fill #0

## 2021-03-11 ENCOUNTER — Ambulatory Visit (HOSPITAL_COMMUNITY)
Admission: RE | Admit: 2021-03-11 | Discharge: 2021-03-11 | Disposition: A | Discharge status: Home / Self Care | Payer: No Typology Code available for payment source | Source: Ambulatory Visit | Attending: Family Medicine | Admitting: Family Medicine

## 2021-03-11 DIAGNOSIS — I5022 Chronic systolic (congestive) heart failure: Secondary | ICD-10-CM | POA: Insufficient documentation

## 2021-03-11 LAB — BASIC METABOLIC PANEL
Anion gap: 9 (ref 5–15)
BUN: 24 mg/dL — ABNORMAL HIGH (ref 8–23)
CO2: 23 mmol/L (ref 22–32)
Calcium: 9.2 mg/dL (ref 8.9–10.3)
Chloride: 104 mmol/L (ref 98–111)
Creatinine, Ser: 1.53 mg/dL — ABNORMAL HIGH (ref 0.61–1.24)
GFR, Estimated: 51 mL/min — ABNORMAL LOW (ref >60)
Glucose, Bld: 237 mg/dL — ABNORMAL HIGH (ref 70–99)
Potassium: 4.8 mmol/L (ref 3.5–5.1)
Sodium: 136 mmol/L (ref 135–145)

## 2021-03-12 ENCOUNTER — Other Ambulatory Visit (HOSPITAL_BASED_OUTPATIENT_CLINIC_OR_DEPARTMENT_OTHER): Payer: Self-pay

## 2021-03-14 ENCOUNTER — Other Ambulatory Visit (HOSPITAL_BASED_OUTPATIENT_CLINIC_OR_DEPARTMENT_OTHER): Payer: Self-pay

## 2021-03-17 ENCOUNTER — Other Ambulatory Visit (HOSPITAL_BASED_OUTPATIENT_CLINIC_OR_DEPARTMENT_OTHER): Payer: Self-pay

## 2021-03-17 ENCOUNTER — Other Ambulatory Visit: Payer: Self-pay | Admitting: Urology

## 2021-03-17 MED ORDER — TESTOSTERONE CYPIONATE 100 MG/ML IM SOLN
INTRAMUSCULAR | 1 refills | Status: DC
  Filled 2021-03-17: qty 10, 28d supply, fill #0

## 2021-03-27 LAB — HM DIABETES EYE EXAM

## 2021-03-28 ENCOUNTER — Encounter: Payer: Self-pay | Admitting: Family Medicine

## 2021-03-31 ENCOUNTER — Ambulatory Visit (HOSPITAL_COMMUNITY)
Admission: RE | Admit: 2021-03-31 | Discharge: 2021-03-31 | Disposition: A | Discharge status: Home / Self Care | Payer: No Typology Code available for payment source | Source: Ambulatory Visit | Attending: Internal Medicine | Admitting: Internal Medicine

## 2021-03-31 ENCOUNTER — Other Ambulatory Visit: Payer: Self-pay

## 2021-03-31 DIAGNOSIS — I5022 Chronic systolic (congestive) heart failure: Secondary | ICD-10-CM | POA: Diagnosis not present

## 2021-03-31 LAB — BASIC METABOLIC PANEL
Anion gap: 7 (ref 5–15)
BUN: 15 mg/dL (ref 8–23)
CO2: 26 mmol/L (ref 22–32)
Calcium: 9.8 mg/dL (ref 8.9–10.3)
Chloride: 105 mmol/L (ref 98–111)
Creatinine, Ser: 1.41 mg/dL — ABNORMAL HIGH (ref 0.61–1.24)
GFR, Estimated: 56 mL/min — ABNORMAL LOW (ref >60)
Glucose, Bld: 162 mg/dL — ABNORMAL HIGH (ref 70–99)
Potassium: 4.4 mmol/L (ref 3.5–5.1)
Sodium: 138 mmol/L (ref 135–145)

## 2021-04-01 ENCOUNTER — Other Ambulatory Visit (HOSPITAL_BASED_OUTPATIENT_CLINIC_OR_DEPARTMENT_OTHER): Payer: Self-pay

## 2021-04-11 ENCOUNTER — Other Ambulatory Visit (HOSPITAL_BASED_OUTPATIENT_CLINIC_OR_DEPARTMENT_OTHER): Payer: Self-pay

## 2021-04-16 ENCOUNTER — Other Ambulatory Visit (HOSPITAL_BASED_OUTPATIENT_CLINIC_OR_DEPARTMENT_OTHER): Payer: Self-pay

## 2021-04-21 ENCOUNTER — Other Ambulatory Visit (HOSPITAL_BASED_OUTPATIENT_CLINIC_OR_DEPARTMENT_OTHER): Payer: Self-pay

## 2021-04-24 ENCOUNTER — Other Ambulatory Visit (HOSPITAL_BASED_OUTPATIENT_CLINIC_OR_DEPARTMENT_OTHER): Payer: Self-pay

## 2021-05-07 ENCOUNTER — Ambulatory Visit (HOSPITAL_COMMUNITY)
Admission: RE | Admit: 2021-05-07 | Discharge: 2021-05-07 | Disposition: A | Discharge status: Home / Self Care | Payer: No Typology Code available for payment source | Source: Ambulatory Visit | Attending: Cardiology | Admitting: Cardiology

## 2021-05-07 ENCOUNTER — Other Ambulatory Visit: Payer: Self-pay

## 2021-05-07 ENCOUNTER — Other Ambulatory Visit (HOSPITAL_BASED_OUTPATIENT_CLINIC_OR_DEPARTMENT_OTHER): Payer: Self-pay

## 2021-05-07 ENCOUNTER — Encounter (HOSPITAL_COMMUNITY): Payer: Self-pay | Admitting: Cardiology

## 2021-05-07 VITALS — BP 104/70 | HR 72 | Wt 202.0 lb

## 2021-05-07 DIAGNOSIS — Z7982 Long term (current) use of aspirin: Secondary | ICD-10-CM | POA: Diagnosis not present

## 2021-05-07 DIAGNOSIS — I5022 Chronic systolic (congestive) heart failure: Secondary | ICD-10-CM | POA: Insufficient documentation

## 2021-05-07 DIAGNOSIS — E1122 Type 2 diabetes mellitus with diabetic chronic kidney disease: Secondary | ICD-10-CM | POA: Diagnosis not present

## 2021-05-07 DIAGNOSIS — I130 Hypertensive heart and chronic kidney disease with heart failure and stage 1 through stage 4 chronic kidney disease, or unspecified chronic kidney disease: Secondary | ICD-10-CM | POA: Insufficient documentation

## 2021-05-07 DIAGNOSIS — I429 Cardiomyopathy, unspecified: Secondary | ICD-10-CM | POA: Diagnosis not present

## 2021-05-07 DIAGNOSIS — N1831 Chronic kidney disease, stage 3a: Secondary | ICD-10-CM | POA: Insufficient documentation

## 2021-05-07 DIAGNOSIS — I2510 Atherosclerotic heart disease of native coronary artery without angina pectoris: Secondary | ICD-10-CM | POA: Insufficient documentation

## 2021-05-07 DIAGNOSIS — Z79899 Other long term (current) drug therapy: Secondary | ICD-10-CM | POA: Diagnosis not present

## 2021-05-07 LAB — BASIC METABOLIC PANEL
Anion gap: 9 (ref 5–15)
BUN: 16 mg/dL (ref 8–23)
CO2: 22 mmol/L (ref 22–32)
Calcium: 9.1 mg/dL (ref 8.9–10.3)
Chloride: 104 mmol/L (ref 98–111)
Creatinine, Ser: 1.42 mg/dL — ABNORMAL HIGH (ref 0.61–1.24)
GFR, Estimated: 56 mL/min — ABNORMAL LOW (ref >60)
Glucose, Bld: 190 mg/dL — ABNORMAL HIGH (ref 70–99)
Potassium: 4.5 mmol/L (ref 3.5–5.1)
Sodium: 135 mmol/L (ref 135–145)

## 2021-05-07 LAB — BRAIN NATRIURETIC PEPTIDE: B Natriuretic Peptide: 14.2 pg/mL (ref 0.0–100.0)

## 2021-05-07 MED ORDER — ENTRESTO 97-103 MG PO TABS
1.0000 | ORAL_TABLET | Freq: Two times a day (BID) | ORAL | 6 refills | Status: DC
  Filled 2021-05-07: qty 60, 30d supply, fill #0
  Filled 2021-06-24: qty 60, 30d supply, fill #1
  Filled 2021-08-04: qty 60, 30d supply, fill #2
  Filled 2021-09-12: qty 60, 30d supply, fill #3
  Filled 2021-10-08: qty 60, 30d supply, fill #4
  Filled 2021-11-20: qty 60, 30d supply, fill #5
  Filled 2021-12-15: qty 60, 30d supply, fill #6

## 2021-05-07 NOTE — Progress Notes (Signed)
PCP: Shelda Pal, DO Cardiology: Dr. Geraldo Pitter HF Cardiology: Dr. Aundra Dubin  61 y.o. with history of CKD stage 3, HTN, and diabetes was referred for evaluation of CHF by Dr. Geraldo Pitter.  Patient has had diabetes and HTN for about 10-15 years per his report.  He has CKD stage 3 in setting of nephrectomy done in childhood due to trauma, follows with nephrology.  He developed atypical chest pain in 2/21 and had Cardiolite done.  This showed EF 23% with fixed inferior defect.  Echo showed EF 35-40%, mild LV dilation, no LVH, normal RV.  He then had LHC done, showing OM1 65% stenosis, distal LAD 70% stenosis.  Cardiomyopathy was thought to be out of proportion to CAD. No recent viral-type infection symptoms.  No strong family history of CHF.    Cardiac MRI in 12/21 was a difficult study, EF 15% with diffuse hypokinesis, mildly decreased RV systolic function, no LGE.    Echo in 3/22 showed EF 30-35%, diffuse hypokinesis, normal RV, normal IVC.  Echo was done today and reviewed, EF 30-35% with mildly decreased RV function, normal IVC.   He returns for followup of CHF. 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  No lightheadedness.  No dyspnea with usual activities.  Only notes dyspnea/fatigue with heavy exertion.  No orthopnea/PND.  No chest pain.  Weight up 3 lbs.   Labs (6/21): LDL 60, K 4.6, creatinine 1.5 Labs (1/22): K 4.5, creatinine 1.46 Labs (3/22): K 4.7, creatinine 1.4 Labs (5/22): K 4.1, creatinine 1.45, LDL 33 Labs (11/22): LDL 51 Labs (12/22): K 4.4, creatinine 1.41  PMH: 1. CKD stage 3: H/o nephrectomy in childhood post-trauma.  2. HTN - Renal artery dopplers (9/21): No significant stenosis.  3. Hyperlipidemia 4. Type 2 diabetes 5. CAD: LHC (2/21) with OM1 65% stenosis, distal LAD 70% stenosis.  6. Chronic systolic CHF: Nonischemic cardiomyopathy, cardiomyopathy out of proportion to CAD.  - Echo (2/21) with EF 35-40%, mild LV dilation, no  LVH, normal RV.  - Cardiac MRI (12/21): Difficult study, EF 15% with diffuse hypokinesis, mildly decreased RV systolic function, no LGE.   - Echo (3/22): EF 30-35%, diffuse hypokinesis, normal RV, normal IVC.  - Echo (8/22): EF 30-35% with mildly decreased RV function, normal IVC.   SH: Works as a Presenter, broadcasting at [employer], married, 1 child, no ETOH or smoking.   FH: Mother with diabetes, possible CHF.   ROS: All systems reviewed and negative except as per HPI.   Current Outpatient Medications  Medication Sig Dispense Refill   acetaminophen (TYLENOL) 500 MG tablet Take 1,000 mg by mouth every 6 (six) hours as needed for moderate pain or headache.     aspirin 81 MG tablet Take 81 mg by mouth daily.     atorvastatin (LIPITOR) 40 MG tablet Take 1 tablet (40 mg total) by mouth daily. 90 tablet 1   Blood Glucose Monitoring Suppl (FREESTYLE LITE) w/Device KIT Use daily to check blood sugar 1 kit 0   carvedilol (COREG) 25 MG tablet Take 1 tablet (25 mg total) by mouth 2 (two) times daily with a meal. 180 tablet 3   dapagliflozin propanediol (FARXIGA) 10 MG TABS tablet Take 1 tablet (10 mg total) by mouth daily. 30 tablet 11   glucose blood (FREESTYLE LITE) test strip Use daily to check blood sugar 100 each 3   Lactobacillus (PROBIOTIC ACIDOPHILUS PO) Take 1 tablet by mouth daily in the afternoon.  Lancets (FREESTYLE) lancets Use to check blood sugars daily 100 each 3   Melatonin 10 MG CAPS Take 30 mg by mouth at bedtime as needed for sleep (sleep).     metFORMIN (GLUCOPHAGE-XR) 500 MG 24 hr tablet Take 2 tablets (1,000 mg total) by mouth daily with breakfast. 60 tablet 3   Multiple Vitamins-Minerals (MULTIVITAMIN ADULT) TABS Take 1 tablet by mouth daily with lunch.      mupirocin ointment (BACTROBAN) 2 % 1 application as needed.     Omega-3 Fatty Acids (FISH OIL) 1000 MG CAPS Take 1,000 mg by mouth daily.     sacubitril-valsartan (ENTRESTO) 97-103 MG Take 1 tablet by mouth 2 (two) times  daily. 60 tablet 6   spironolactone (ALDACTONE) 25 MG tablet Take 25 mg by mouth daily.     Testosterone Cypionate 200 MG/ML SOLN Inject 0.8 mLs as directed once a week.     triamcinolone ointment (KENALOG) 0.1 % Apply 1 application topically 2 (two) times daily as needed for rash.     No current facility-administered medications for this encounter.   BP 104/70    Pulse 72    Wt 91.6 kg (202 lb)    SpO2 96%    BMI 26.65 kg/m  General: NAD Neck: No JVD, no thyromegaly or thyroid nodule.  Lungs: Clear to auscultation bilaterally with normal respiratory effort. CV: Nondisplaced PMI.  Heart regular S1/S2, no S3/S4, no murmur.  No peripheral edema.  No carotid bruit.  Normal pedal pulses.  Abdomen: Soft, nontender, no hepatosplenomegaly, no distention.  Skin: Intact without lesions or rashes.  Neurologic: Alert and oriented x 3.  Psych: Normal affect. Extremities: No clubbing or cyanosis.  HEENT: Normal.   Assessment/Plan: 1. Chronic systolic CHF: Echo in 8/55 with EF 35-40%.  LHC in 2/21 with moderate coronary disease, but coronary disease appears out of proportion to the severity of the cardiomyopathy.  It is possible that long-standing HTN and DM have triggered the cardiomyopathy.  Cardiac MRI in 12/21 was a difficult study, showed EF 15% with diffuse hypokinesis, mildly decreased RV systolic function, no LGE.  No evidence for infiltrative disease or myocarditis. Echo in 8/22 stable EF 30-35%, mildly decreased RV systolic function. NYHA class I-II symptoms, he not volume overloaded.  - Increase Entresto to 97/103 bid.  BMET/BNP today and BMET again in 10 days.   - Continue Coreg 25 mg bid.  - Continue spironolactone 25 mg dailiy.  - Continue Farxiga 10 mg daily.  - EF has been persistently low, will need to consider ICD though with minimal symptoms and nonischemic etiology, benefit may not be as high.  However, he is relatively young and healthy, so I think that ICD placement would be  reasonable.  Not CRT candidate with narrow QRS.  He has not made up his mind yet but would like to talk with EP.  I will make referral.  2. CAD: Moderate disease, no intervention on 2/21 cath. No chest pain.  - Continue ASA 81 daily.  - Continue atorvastatin, good lipids in 5/22.  3. HTN: BP controlled. 4. CKD stage 3a: Solitary kidney.   Follow carefully.  - Continue Farxiga.   Followup 4 months.   Loralie Champagne 05/07/2021

## 2021-05-07 NOTE — Patient Instructions (Addendum)
Increase Entresto to 97/103 mg Twice daily   Labs done today, your results will be available in MyChart, we will contact you for abnormal readings.  Your physician recommends that you return for lab work in: 1-2 weeks  You have been referred to EP to discuss a defibrillator, they will call you for an appointment  Your physician recommends that you schedule a follow-up appointment in: 4 months  If you have any questions or concerns before your next appointment please send [place] a message through [place] or call our office at [phone].    TO LEAVE A MESSAGE FOR THE NURSE SELECT OPTION 2, PLEASE LEAVE A MESSAGE INCLUDING: YOUR NAME DATE OF BIRTH CALL BACK NUMBER REASON FOR CALL**this is important as we prioritize the call backs  YOU WILL RECEIVE A CALL BACK THE SAME DAY AS LONG AS YOU CALL BEFORE 4:00 PM  At the [facility], you and your health needs are our priority. As part of our continuing mission to provide you with exceptional heart care, we have created designated Provider Care Teams. These Care Teams include your primary Cardiologist (physician) and Advanced Practice Providers (APPs- Physician Assistants and Nurse Practitioners) who all work together to provide you with the care you need, when you need it.   You may see any of the following providers on your designated Care Team at your next follow up: Dr Arvilla Meres Dr Carron Curie, NP Robbie Lis, [place] [facility] [place], [place] Karle Plumber, PharmD   Please be sure to bring in all your medications bottles to every appointment.    Cardioverter Defibrillator Implantation An implantable cardioverter defibrillator (ICD) is a device that identifies and corrects abnormal heart rhythms. Cardioverter defibrillator implantation is a surgery to place an ICD under the skin in the chest or abdomen. An ICD has a battery, a small computer (pulse generator), and wires (leads) that go into  the heart. The ICD detects and corrects two types of dangerous irregular heart rhythms (arrhythmias): A rapid heart rhythm in the lower chambers of the heart (ventricles). This is called ventricular tachycardia. The ventricles contracting in an uncoordinated way. This is called ventricular fibrillation. There are different types of ICDs, and the electrical signals from the ICD can be programmed differently based on the condition being treated. The electrical signals from the ICD can be low-energy pulses, high-energy shocks, or a combination of the two. The low-energy pulses are generally used to restore the heartbeat to normal when it is either too slow (bradycardia) or too fast. These pulses are painless. The high-energy shocks are used to treat abnormal rhythms such as ventricular tachycardia or ventricular fibrillation. This shock may feel like a strong jolt in the chest. Your health care provider may recommend an ICD if you have: Had a ventricular arrhythmia in the past. A damaged heart because of a disease or heart condition. A weakened heart muscle from a heart attack or cardiac arrest. A congenital heart defect. Long QT syndrome, which is a disorder of the heart's electrical system. Brugada syndrome, which is a condition that causes a disruption of the heart's normal rhythm. Tell a health care provider about: Any allergies you have. All medicines you are taking, including vitamins, herbs, eye drops, creams, and over-the-counter medicines. Any problems you or family members have had with anesthetic medicines. Any blood disorders you have. Any surgeries you have had. Any medical conditions you have. Whether you are pregnant or may be pregnant. What are the risks?  Generally, this is a safe procedure. However, problems may occur, including: Infection. Bleeding. Allergic reactions to medicines used during the procedure. Blood clots. Swelling or bruising. Damage to nearby structures or  organs, such as nerves, lungs, blood vessels, or the heart where the ICD leads or pulse generator is implanted. What happens before the procedure? Staying hydrated Follow instructions from your health care provider about hydration, which may include: Up to 2 hours before the procedure - you may continue to drink clear liquids, such as water, clear fruit juice, black coffee, and plain tea.  Eating and drinking restrictions Follow instructions from your health care provider about eating and drinking, which may include: 8 hours before the procedure - stop eating heavy meals or foods, such as meat, fried foods, or fatty foods. 6 hours before the procedure - stop eating light meals or foods, such as toast or cereal. 6 hours before the procedure - stop drinking milk or drinks that contain milk. 2 hours before the procedure - stop drinking clear liquids. Medicines Ask your health care provider about: Changing or stopping your regular medicines. This is especially important if you are taking diabetes medicines or blood thinners. Taking medicines such as aspirin and ibuprofen. These medicines can thin your blood. Do not take these medicines unless your health care provider tells you to take them. Taking over-the-counter medicines, vitamins, herbs, and supplements. Tests You may have an exam or testing. These may include: Blood tests. A test to check the electrical signals in your heart (electrocardiogram, ECG). Imaging tests, such as a chest X-ray. Echocardiogram. This is an ultrasound of your heart to evaluate your heart structures and function. An event monitor or Holter monitor to wear at home. General instructions Do not use any products that contain nicotine or tobacco for at least 4 weeks before the procedure. These products include cigarettes, chewing tobacco, and vaping devices, such as e-cigarettes. If you need help quitting, ask your health care provider. Ask your health care  provider: How your procedure site will be marked. What steps will be taken to help prevent infection. These may include: Removing hair at the surgery site. Washing skin with a germ-killing soap. Taking antibiotic medicine. You may be asked to shower with a germ-killing soap. Plan to have a responsible adult take you home from the hospital or clinic. What happens during the procedure?  Small monitors will be put on your body. They will be used to check your heart rate, blood pressure, and oxygen level. A pair of sticky pads (defibrillator pads) may be placed on your back and chest. These pads are able to pace your heart as needed during the procedure. An IV will be inserted into one of your veins. You will be given one or more of the following: A medicine to help you relax (sedative). A medicine to numb the area (local anesthetic). A medicine to make you fall asleep(general anesthetic). A small incision will be made to create a deep pocket under the skin of your chest or abdomen. Leads will be guided through a blood vessel into your heart and attached to your heart muscles. Depending on the ICD, the leads may go into one ventricle, or they may go into both ventricles and into an upper chamber of the heart. An X-ray machine (fluoroscope) will be used to help guide the leads. The other end of the leads will be attached to the pulse generator. The pulse generator will be placed into the pocket under the skin. The ICD  will be tested, and your health care provider will program the ICD for the condition being treated. The incision will be closed with stitches (sutures), skin glue, adhesive strips, or staples. A bandage (dressing) will be placed over the incision. The procedure may vary among health care providers and hospitals. What happens after the procedure? Your blood pressure, heart rate, breathing rate, and blood oxygen level will be monitored until you leave the hospital or clinic. Your  health care provider will also monitor your ICD to make sure it is working properly. A chest X-ray will be taken to check that the ICD is in the right place. Do not raise the arm on the side of your procedure higher than your shoulder for as long as told by your health care provider. This is usually at least 6 weeks. You may be given an identification card explaining that you have an ICD. You will be given a remote home monitoring device to use with your ICD to allow your device to communicate with your clinic. Summary An implantable cardioverter defibrillator (ICD) is a device that identifies and corrects abnormal heart rhythms. Cardioverter defibrillator implantation is a surgery to place an ICD under the skin in the chest or abdomen. An ICD consists of a battery, a small computer (pulse generator), and wires (leads) that go into the heart. During the procedure, the ICD will be tested, and your health care provider will program the ICD for the condition being treated. After the procedure, a chest X-ray will be taken to check that the ICD is in the right place. This information is not intended to replace advice given to you by your health care provider. Make sure you discuss any questions you have with your health care provider. Document Revised: 10/04/2019 Document Reviewed: 10/04/2019 Elsevier Patient Education  2022 [employer].

## 2021-05-09 ENCOUNTER — Other Ambulatory Visit (HOSPITAL_BASED_OUTPATIENT_CLINIC_OR_DEPARTMENT_OTHER): Payer: Self-pay

## 2021-05-13 ENCOUNTER — Other Ambulatory Visit (HOSPITAL_BASED_OUTPATIENT_CLINIC_OR_DEPARTMENT_OTHER): Payer: Self-pay

## 2021-05-20 ENCOUNTER — Other Ambulatory Visit: Payer: Self-pay

## 2021-05-20 ENCOUNTER — Ambulatory Visit (HOSPITAL_COMMUNITY)
Admission: RE | Admit: 2021-05-20 | Discharge: 2021-05-20 | Disposition: A | Discharge status: Home / Self Care | Payer: No Typology Code available for payment source | Source: Ambulatory Visit | Attending: Internal Medicine | Admitting: Internal Medicine

## 2021-05-20 DIAGNOSIS — I5022 Chronic systolic (congestive) heart failure: Secondary | ICD-10-CM | POA: Diagnosis present

## 2021-05-20 LAB — BASIC METABOLIC PANEL
Anion gap: 8 (ref 5–15)
BUN: 14 mg/dL (ref 8–23)
CO2: 24 mmol/L (ref 22–32)
Calcium: 9.5 mg/dL (ref 8.9–10.3)
Chloride: 106 mmol/L (ref 98–111)
Creatinine, Ser: 1.49 mg/dL — ABNORMAL HIGH (ref 0.61–1.24)
GFR, Estimated: 52 mL/min — ABNORMAL LOW (ref >60)
Glucose, Bld: 121 mg/dL — ABNORMAL HIGH (ref 70–99)
Potassium: 4.6 mmol/L (ref 3.5–5.1)
Sodium: 138 mmol/L (ref 135–145)

## 2021-05-21 ENCOUNTER — Other Ambulatory Visit (HOSPITAL_BASED_OUTPATIENT_CLINIC_OR_DEPARTMENT_OTHER): Payer: Self-pay

## 2021-05-27 ENCOUNTER — Other Ambulatory Visit (HOSPITAL_BASED_OUTPATIENT_CLINIC_OR_DEPARTMENT_OTHER): Payer: Self-pay

## 2021-06-04 ENCOUNTER — Encounter: Payer: Self-pay | Admitting: Family Medicine

## 2021-06-04 ENCOUNTER — Ambulatory Visit (INDEPENDENT_AMBULATORY_CARE_PROVIDER_SITE_OTHER): Payer: No Typology Code available for payment source | Admitting: Family Medicine

## 2021-06-04 ENCOUNTER — Other Ambulatory Visit (HOSPITAL_BASED_OUTPATIENT_CLINIC_OR_DEPARTMENT_OTHER): Payer: Self-pay

## 2021-06-04 VITALS — BP 115/70 | HR 70 | Temp 98.2°F | Resp 16 | Ht 73.0 in | Wt 201.2 lb

## 2021-06-04 DIAGNOSIS — E785 Hyperlipidemia, unspecified: Secondary | ICD-10-CM

## 2021-06-04 DIAGNOSIS — E1165 Type 2 diabetes mellitus with hyperglycemia: Secondary | ICD-10-CM

## 2021-06-04 MED ORDER — ATORVASTATIN CALCIUM 40 MG PO TABS
40.0000 mg | ORAL_TABLET | Freq: Every day | ORAL | 3 refills | Status: DC
  Filled 2021-06-04: qty 90, 90d supply, fill #0
  Filled 2021-09-01: qty 90, 90d supply, fill #1
  Filled 2021-12-12 – 2021-12-15 (×3): qty 90, 90d supply, fill #2
  Filled 2022-03-17: qty 90, 90d supply, fill #3

## 2021-06-04 MED ORDER — DAPAGLIFLOZIN PROPANEDIOL 10 MG PO TABS
10.0000 mg | ORAL_TABLET | Freq: Every day | ORAL | 2 refills | Status: DC
  Filled 2021-06-04 – 2021-06-24 (×2): qty 90, 90d supply, fill #0
  Filled 2021-09-29: qty 90, 90d supply, fill #1
  Filled 2021-11-27 – 2021-12-24 (×2): qty 90, 90d supply, fill #2

## 2021-06-04 NOTE — Progress Notes (Signed)
Subjective:   Chief Complaint  Patient presents with   Diabetes    Diabetes follow up    Michael Guerra is a 61 y.o. male here for follow-up of diabetes.   Edelmiro's self monitored glucose range is 90-150's.  Patient denies hypoglycemic reactions. He checks his glucose levels 2 time(s) per day. Patient does not require insulin.   Medications include: Farxiga 10 mg/d, metformin XR 1000 mg/d Diet is poor.  Exercise: elliptical, lifting wts, walking No Cp or SOB.   Past Medical History:  Diagnosis Date   CAD (coronary artery disease) 06/08/2019   Cardiomyopathy (HCC) 05/23/2019   Cataract    Chest discomfort 05/12/2019   CHF (congestive heart failure) (HCC)    Chronic kidney disease    one kidney that is healthy   Chronic left shoulder pain 02/05/2020   Diabetes mellitus due to underlying condition with unspecified complications (HCC) 05/12/2019   Essential hypertension 05/12/2019   Foot sprain, left, initial encounter 06/22/2019   LIVER FUNCTION TESTS, ABNORMAL 05/21/2006   Qualifier: Diagnosis of  By: Maple Hudson MD, Riki Rusk     Mixed dyslipidemia 05/12/2019   Other specified abnormal findings of blood chemistry 05/20/2011   Formatting of this note might be different from the original. Elevated liver function tests 10/1 IMO update   Rectal polyp 05/20/2011   Formatting of this note might be different from the original. Noted march 2011 fu in march 2016   Renal insufficiency 05/23/2019   Skin lesion of chest wall 05/20/2011   Formatting of this note might be different from the original. Mid chest (prob seb/epidermoid cyst)   Solitary kidney    Type 2 diabetes mellitus with hyperglycemia, without long-term current use of insulin (HCC) 02/17/2006   Qualifier: Diagnosis of  By: Maple Hudson MD, Riki Rusk       Related testing: Retinal exam: Done Pneumovax: done  Objective:  BP 115/70 (BP Location: Right Arm, Patient Position: Sitting, Cuff Size: Normal)    Pulse 70    Temp 98.2 F (36.8 C) (Oral)    Resp  16    Ht 6\' 1"  (1.854 m)    Wt 201 lb 3.2 oz (91.3 kg)    SpO2 96%    BMI 26.55 kg/m  General:  Well developed, well nourished, in no apparent distress Lungs:  CTAB, no access msc use Cardio:  RRR, no bruits, no LE edema Psych: Age appropriate judgment and insight  Assessment:   Type 2 diabetes mellitus with hyperglycemia, without long-term current use of insulin (HCC), Chronic - Plan: Hemoglobin A1c  Hyperlipidemia, unspecified hyperlipidemia type - Plan: atorvastatin (LIPITOR) 40 MG tablet   Plan:   Chronic, uncontrolled. Ck A1c. Cont Metformin XR 1000 mg/d, Farxiga 10 mg/d. Would consider increasing the former if not controlled vs adding GLP-1. Counseled on diet and exercise. F/u pending the above.  The patient voiced understanding and agreement to the plan.  [name], DO 06/04/21 3:19 PM

## 2021-06-04 NOTE — Patient Instructions (Addendum)
Give [place] 2-3 business days to get the results of your labs back. Follow up will be determine by your numbers.   Keep the diet clean and stay active.  Let us know if you need anything.

## 2021-06-05 ENCOUNTER — Other Ambulatory Visit (HOSPITAL_BASED_OUTPATIENT_CLINIC_OR_DEPARTMENT_OTHER): Payer: Self-pay

## 2021-06-05 ENCOUNTER — Ambulatory Visit: Payer: No Typology Code available for payment source | Attending: Internal Medicine

## 2021-06-05 ENCOUNTER — Other Ambulatory Visit: Payer: Self-pay | Admitting: Family Medicine

## 2021-06-05 ENCOUNTER — Other Ambulatory Visit: Payer: Self-pay | Admitting: Urology

## 2021-06-05 DIAGNOSIS — Z23 Encounter for immunization: Secondary | ICD-10-CM

## 2021-06-05 LAB — HEMOGLOBIN A1C: Hgb A1c MFr Bld: 7.6 % — ABNORMAL HIGH (ref 4.6–6.5)

## 2021-06-05 MED ORDER — PFIZER COVID-19 VAC BIVALENT 30 MCG/0.3ML IM SUSP
INTRAMUSCULAR | 0 refills | Status: DC
  Filled 2021-06-05: qty 0.3, 1d supply, fill #0

## 2021-06-05 MED ORDER — METFORMIN HCL ER 500 MG PO TB24
1000.0000 mg | ORAL_TABLET | Freq: Two times a day (BID) | ORAL | 2 refills | Status: DC
  Filled 2021-06-05 – 2021-07-17 (×2): qty 180, 45d supply, fill #0
  Filled 2021-09-22: qty 180, 45d supply, fill #1
  Filled 2021-12-24: qty 180, 45d supply, fill #2

## 2021-06-05 NOTE — Progress Notes (Signed)
° °  Covid-19 Vaccination Clinic  Name:  DEREL MCGLASSON    MRN: [id number] DOB: [birth year]  06/05/2021  Mr. Hillock was observed post Covid-19 immunization for 15 minutes without incident. He was provided with Vaccine Information Sheet and instruction to access the V-Safe system.   Mr. Lessley was instructed to call 911 with any severe reactions post vaccine: Difficulty breathing  Swelling of face and throat  A fast heartbeat  A bad rash all over body  Dizziness and weakness   Immunizations Administered     Name Date Dose VIS Date Route   Pfizer Covid-19 Vaccine Bivalent Booster 06/05/2021 10:16 AM 0.3 mL 12/18/2020 Intramuscular   Manufacturer: [employer], [employer]   Lot: [id number]   NDC: [phone]

## 2021-06-10 ENCOUNTER — Other Ambulatory Visit (HOSPITAL_BASED_OUTPATIENT_CLINIC_OR_DEPARTMENT_OTHER): Payer: Self-pay

## 2021-06-12 ENCOUNTER — Other Ambulatory Visit: Payer: Self-pay | Admitting: Urology

## 2021-06-12 ENCOUNTER — Other Ambulatory Visit (HOSPITAL_BASED_OUTPATIENT_CLINIC_OR_DEPARTMENT_OTHER): Payer: Self-pay

## 2021-06-16 ENCOUNTER — Other Ambulatory Visit (HOSPITAL_BASED_OUTPATIENT_CLINIC_OR_DEPARTMENT_OTHER): Payer: Self-pay

## 2021-06-20 ENCOUNTER — Other Ambulatory Visit (HOSPITAL_BASED_OUTPATIENT_CLINIC_OR_DEPARTMENT_OTHER): Payer: Self-pay

## 2021-06-20 MED ORDER — TESTOSTERONE CYPIONATE 200 MG/ML IM SOLN
INTRAMUSCULAR | 1 refills | Status: DC
  Filled 2021-06-20: qty 10, 28d supply, fill #0
  Filled 2021-12-09: qty 10, 28d supply, fill #1

## 2021-06-23 ENCOUNTER — Encounter: Payer: Self-pay | Admitting: Internal Medicine

## 2021-06-23 ENCOUNTER — Ambulatory Visit: Payer: No Typology Code available for payment source | Admitting: Internal Medicine

## 2021-06-23 ENCOUNTER — Other Ambulatory Visit: Payer: Self-pay

## 2021-06-23 ENCOUNTER — Other Ambulatory Visit (HOSPITAL_BASED_OUTPATIENT_CLINIC_OR_DEPARTMENT_OTHER): Payer: Self-pay

## 2021-06-23 VITALS — BP 122/64 | HR 75 | Ht 73.0 in | Wt 199.0 lb

## 2021-06-23 DIAGNOSIS — I429 Cardiomyopathy, unspecified: Secondary | ICD-10-CM

## 2021-06-23 DIAGNOSIS — I5022 Chronic systolic (congestive) heart failure: Secondary | ICD-10-CM

## 2021-06-23 NOTE — Progress Notes (Signed)
ELECTROPHYSIOLOGY CONSULT NOTE  Patient ID: Michael Guerra, MRN: [id number], DOB/AGE: Feb 16, 1961 61 y.o. Admit date: (Not on file) Date of Consult: 06/23/2021  Primary Physician: Sharlene Dory, DO Primary Cardiologist: DM/RR     Michael Guerra is a 61 y.o. male who is being seen today for the evaluation of ICD at the request of Dr DM.    HPI Michael Guerra is a 61 y.o. male referred for consideration of an ICD.  He has a history of a nonischemic cardiomyopathy identified 2/21 when he had atypical chest pain and a Myoview demonstrated an EF of 23% with a fixed inferior defect.  Further evaluation is as outlined below, he had nonobstructive coronary disease a MRI with a significantly depressed left ventricular function but no evidence of gadolinium enhancement.  He has done exceedingly well on guideline directed therapy.  He is able to climb stairs short of breath occasionally.  Does not have nocturnal dyspnea orthopnea or peripheral edema.  He has had problems with syncope particularly when he was being treated with his initial afterload reducing regime; he continues to have episodes of lightheadedness particular on exercise days and after he takes his morning medicines.      DATE TEST EF   2/21 Echo  35-40%   2/21 LHC  LADd-70%;OM1 65%  12/21 cMRI 15% LGE neg  3/22 Echo   30-35 %   8/22 Echo  30-35 %         Date Cr K HgbA1c Hgb WBC  11/22    9.1 15.2 2.6  1/23 1.49 4.6 7.6        Past Medical History:  Diagnosis Date   CAD (coronary artery disease) 06/08/2019   Cardiomyopathy (HCC) 05/23/2019   Cataract    Chest discomfort 05/12/2019   CHF (congestive heart failure) (HCC)    Chronic kidney disease    one kidney that is healthy   Chronic left shoulder pain 02/05/2020   Diabetes mellitus due to underlying condition with unspecified complications (HCC) 05/12/2019   Essential hypertension 05/12/2019   Foot sprain, left, initial encounter 06/22/2019   LIVER  FUNCTION TESTS, ABNORMAL 05/21/2006   Qualifier: Diagnosis of  By: Maple Hudson MD, Riki Rusk     Mixed dyslipidemia 05/12/2019   Other specified abnormal findings of blood chemistry 05/20/2011   Formatting of this note might be different from the original. Elevated liver function tests 10/1 IMO update   Rectal polyp 05/20/2011   Formatting of this note might be different from the original. Noted march 2011 fu in march 2016   Renal insufficiency 05/23/2019   Skin lesion of chest wall 05/20/2011   Formatting of this note might be different from the original. Mid chest (prob seb/epidermoid cyst)   Solitary kidney    Type 2 diabetes mellitus with hyperglycemia, without long-term current use of insulin (HCC) 02/17/2006   Qualifier: Diagnosis of  By: Maple Hudson MD, Riki Rusk        Surgical History:  Past Surgical History:  Procedure Laterality Date   ABDOMINAL SURGERY     KIDNEY SURGERY     bowel obstruction  x 3   LEFT HEART CATH AND CORONARY ANGIOGRAPHY N/A 05/31/2019   Procedure: LEFT HEART CATH AND CORONARY ANGIOGRAPHY;  Surgeon: Iran Ouch, MD;  Location: [facility];  Service: Cardiovascular;  Laterality: N/A;     Home Meds: Current Meds  Medication Sig   acetaminophen (TYLENOL) 500 MG tablet Take 1,000 mg by mouth  every 6 (six) hours as needed for moderate pain or headache.   aspirin 81 MG tablet Take 81 mg by mouth daily.   atorvastatin (LIPITOR) 40 MG tablet Take 1 tablet (40 mg total) by mouth daily.   Blood Glucose Monitoring Suppl (FREESTYLE LITE) w/Device KIT Use daily to check blood sugar   carvedilol (COREG) 25 MG tablet Take 1 tablet (25 mg total) by mouth 2 (two) times daily with a meal.   COVID-19 mRNA bivalent vaccine, Pfizer, (PFIZER COVID-19 VAC BIVALENT) injection Inject into the muscle.   dapagliflozin propanediol (FARXIGA) 10 MG TABS tablet Take 1 tablet (10 mg total) by mouth daily.   glucose blood (FREESTYLE LITE) test strip Use daily to check blood sugar   Lactobacillus  (PROBIOTIC ACIDOPHILUS PO) Take 1 tablet by mouth daily in the afternoon.   Lancets (FREESTYLE) lancets Use to check blood sugars daily   Melatonin 10 MG CAPS Take 30 mg by mouth at bedtime as needed for sleep (sleep).   metFORMIN (GLUCOPHAGE-XR) 500 MG 24 hr tablet Take 2 tablets (1,000 mg total) by mouth in the morning and at bedtime.   Multiple Vitamins-Minerals (MULTIVITAMIN ADULT) TABS Take 1 tablet by mouth daily with lunch.    mupirocin ointment (BACTROBAN) 2 % 1 application as needed.   Omega-3 Fatty Acids (FISH OIL) 1000 MG CAPS Take 1,000 mg by mouth daily.   sacubitril-valsartan (ENTRESTO) 97-103 MG Take 1 tablet by mouth 2 (two) times daily.   spironolactone (ALDACTONE) 25 MG tablet Take 25 mg by mouth daily.   testosterone cypionate (DEPOTESTOSTERONE CYPIONATE) 200 MG/ML injection Inject 0.63mls into the muscle once weekly   Testosterone Cypionate 200 MG/ML SOLN Inject 0.8 mLs as directed once a week.   triamcinolone ointment (KENALOG) 0.1 % Apply 1 application topically 2 (two) times daily as needed for rash.    Allergies:  Allergies  Allergen Reactions   Lisinopril Cough    Social History   Socioeconomic History   Marital status: Married    Spouse name: Not on file   Number of children: Not on file   Years of education: Not on file   Highest education level: Not on file  Occupational History   Not on file  Tobacco Use   Smoking status: Never   Smokeless tobacco: Never  Substance and Sexual Activity   Alcohol use: No   Drug use: No   Sexual activity: Not on file  Other Topics Concern   Not on file  Social History Narrative   Not on file   Social Determinants of Health   Financial Resource Strain: Not on file  Food Insecurity: Not on file  Transportation Needs: Not on file  Physical Activity: Not on file  Stress: Not on file  Social Connections: Not on file  Intimate Partner Violence: Not on file     Family History  Problem Relation Age of Onset    Cancer Neg Hx    Colon cancer Neg Hx    Esophageal cancer Neg Hx    Rectal cancer Neg Hx    Stomach cancer Neg Hx      ROS:  Please see the history of present illness.     All other systems reviewed and negative.    Physical Exam:  Blood pressure 122/64, pulse 75, height 6\' 1"  (1.854 m), weight 199 lb (90.3 kg), SpO2 96 %. General: Well developed, well nourished male in no acute distress. Head: Normocephalic, atraumatic, sclera non-icteric, no xanthomas, nares are without discharge. EENT:  normal  Lymph Nodes:  none Neck: Negative for carotid bruits. JVD not elevated. Back:without scoliosis kyphosis  Lungs: Clear bilaterally to auscultation without wheezes, rales, or rhonchi. Breathing is unlabored. Heart: RRR with S1 S2. No  murmur . No rubs, or gallops appreciated. Abdomen: Soft, non-tender, non-distended with normoactive bowel sounds. No hepatomegaly. No rebound/guarding. No obvious abdominal masses. Msk:  Strength and tone appear normal for age. Extremities: No clubbing or cyanosis. No   edema.  Distal pedal pulses are 2+ and equal bilaterally. Skin: Warm and Dry Neuro: Alert and oriented X 3. CN III-XII intact Grossly normal sensory and motor function . Psych:  Responds to questions appropriately with a normal affect.        SAY:TKZSW  @ 73 19[birth year]/37  NSTT   Assessment and Plan:  NICM  CHF class 1-2  Orthostatic lightheadedness  The patient has persistent left ventricular dysfunction in the setting of a nonischemic cardiomyopathy of unclear cause.  His ejection fraction however has gotten considerably better than it was at its nadir, and it has been more than 6 months since his last evaluation.  Prior to making recommendation regarding ICD particular in the nonischemic cohort with minimal symptoms I think a better assessment of his left ventricular function is appropriate.  We will get an echo.  Moreover, his functional status, while exceedingly good I think is really  class II with intermittent shortness of breath at the top of 1 flight of stairs.  Hence, I think his ejection fraction is below 30-35% still, it is reasonable to consider the ICD.  We reviewed together the data from [place] including the attenuated benefit of age; moreover I referenced the recent data from the long-term follow-up from [place] which would suggest that the 5-year benefit is about 4-5% i.e. relatively modest.  Also reviewed the physiology of the orthostatic lightheadedness; suggested either the use of compression shorts after his exercise or increased hydration after the exercise was to try to minimize the risk of recurrent orthostatic syncope.  84 min was spent in care of the patient including the review of records    Sherryl Manges

## 2021-06-23 NOTE — Patient Instructions (Addendum)
Medication Instructions:  Your physician recommends that you continue on your current medications as directed. Please refer to the Current Medication list given to you today.  *If you need a refill on your cardiac medications before your next appointment, please call your pharmacy*   Lab Work: None ordered.  If you have labs (blood work) drawn today and your tests are completely normal, you will receive your results only by: MyChart Message (if you have MyChart) OR A paper copy in the mail If you have any lab test that is abnormal or we need to change your treatment, we will call you to review the results.   Testing/Procedures: Your physician has requested that you have an echocardiogram. Echocardiography is a painless test that uses sound waves to create images of your heart. It provides your doctor with information about the size and shape of your heart and how well your heart's chambers and valves are working. This procedure takes approximately one hour. There are no restrictions for this procedure.    Follow-Up: At [facility], you and your health needs are our priority.  As part of our continuing mission to provide you with exceptional heart care, we have created designated Provider Care Teams.  These Care Teams include your primary Cardiologist (physician) and Advanced Practice Providers (APPs -  Physician Assistants and Nurse Practitioners) who all work together to provide you with the care you need, when you need it.  We recommend signing up for the patient portal called "MyChart".  Sign up information is provided on this After Visit Summary.  MyChart is used to connect with patients for Virtual Visits (Telemedicine).  Patients are able to view lab/test results, encounter notes, upcoming appointments, etc.  Non-urgent messages can be sent to your provider as well.   To learn more about what you can do with MyChart, go to [email or web address].    Your next appointment:    Follow up with Dr Klein as needed 

## 2021-06-23 NOTE — Addendum Note (Signed)
Addended by: Thora Lance on: 06/23/2021 05:55 PM ? ? Modules accepted: Orders ? ?

## 2021-06-24 ENCOUNTER — Other Ambulatory Visit (HOSPITAL_BASED_OUTPATIENT_CLINIC_OR_DEPARTMENT_OTHER): Payer: Self-pay

## 2021-06-24 ENCOUNTER — Other Ambulatory Visit (HOSPITAL_COMMUNITY): Payer: Self-pay | Admitting: *Deleted

## 2021-06-24 ENCOUNTER — Other Ambulatory Visit: Payer: Self-pay | Admitting: Family Medicine

## 2021-06-24 DIAGNOSIS — I5022 Chronic systolic (congestive) heart failure: Secondary | ICD-10-CM

## 2021-06-24 NOTE — Progress Notes (Signed)
Echo order placed per Dr Shirlee Latch and Dr Graciela Husbands ?

## 2021-06-25 ENCOUNTER — Other Ambulatory Visit (HOSPITAL_BASED_OUTPATIENT_CLINIC_OR_DEPARTMENT_OTHER): Payer: Self-pay

## 2021-06-25 MED ORDER — "BD HYPODERMIC NEEDLE 22G X 1"" MISC"
1 refills | Status: DC
  Filled 2021-06-25: qty 4, 28d supply, fill #0
  Filled 2021-08-04: qty 4, 28d supply, fill #1

## 2021-06-25 MED ORDER — SPIRONOLACTONE 25 MG PO TABS
25.0000 mg | ORAL_TABLET | Freq: Every day | ORAL | 3 refills | Status: DC
  Filled 2021-06-25: qty 30, 30d supply, fill #0
  Filled 2021-08-15: qty 30, 30d supply, fill #1

## 2021-06-25 MED ORDER — "BD LUER-LOK SYRINGE 18G X 1-1/2"" 3 ML MISC"
1 refills | Status: DC
  Filled 2021-06-25: qty 4, 28d supply, fill #0
  Filled 2021-08-04: qty 4, 28d supply, fill #1

## 2021-06-25 MED ORDER — "EASY TOUCH HYPODERMIC NEEDLE 22G X 1-1/2"" MISC"
1 refills | Status: DC
  Filled 2021-06-25: qty 4, 28d supply, fill #0
  Filled 2022-04-15: qty 4, 4d supply, fill #0
  Filled 2022-06-09: qty 4, 4d supply, fill #1

## 2021-07-02 ENCOUNTER — Ambulatory Visit (HOSPITAL_COMMUNITY)
Admission: RE | Admit: 2021-07-02 | Discharge: 2021-07-02 | Disposition: A | Discharge status: Home / Self Care | Payer: No Typology Code available for payment source | Source: Ambulatory Visit | Attending: Cardiology | Admitting: Cardiology

## 2021-07-02 ENCOUNTER — Other Ambulatory Visit: Payer: Self-pay

## 2021-07-02 DIAGNOSIS — I110 Hypertensive heart disease with heart failure: Secondary | ICD-10-CM | POA: Diagnosis not present

## 2021-07-02 DIAGNOSIS — I5022 Chronic systolic (congestive) heart failure: Secondary | ICD-10-CM | POA: Insufficient documentation

## 2021-07-02 DIAGNOSIS — I2510 Atherosclerotic heart disease of native coronary artery without angina pectoris: Secondary | ICD-10-CM | POA: Diagnosis not present

## 2021-07-02 DIAGNOSIS — E785 Hyperlipidemia, unspecified: Secondary | ICD-10-CM | POA: Insufficient documentation

## 2021-07-02 DIAGNOSIS — E119 Type 2 diabetes mellitus without complications: Secondary | ICD-10-CM | POA: Insufficient documentation

## 2021-07-02 LAB — ECHOCARDIOGRAM COMPLETE
AR max vel: 2.28 cm2
AV Peak grad: 6.1 mmHg
Ao pk vel: 1.23 m/s
Area-P 1/2: 3.42 cm2
Calc EF: 46.2 %
S' Lateral: 3.4 cm
Single Plane A2C EF: 48.6 %
Single Plane A4C EF: 43.1 %

## 2021-07-17 ENCOUNTER — Other Ambulatory Visit (HOSPITAL_BASED_OUTPATIENT_CLINIC_OR_DEPARTMENT_OTHER): Payer: Self-pay

## 2021-07-29 ENCOUNTER — Encounter: Payer: Self-pay | Admitting: Emergency Medicine

## 2021-07-29 ENCOUNTER — Other Ambulatory Visit: Payer: Self-pay

## 2021-07-29 ENCOUNTER — Emergency Department (HOSPITAL_BASED_OUTPATIENT_CLINIC_OR_DEPARTMENT_OTHER)
Admission: EM | Admit: 2021-07-29 | Discharge: 2021-07-30 | Disposition: A | Discharge status: Home / Self Care | Payer: No Typology Code available for payment source | Attending: Emergency Medicine | Admitting: Emergency Medicine

## 2021-07-29 ENCOUNTER — Encounter (HOSPITAL_BASED_OUTPATIENT_CLINIC_OR_DEPARTMENT_OTHER): Payer: Self-pay

## 2021-07-29 ENCOUNTER — Ambulatory Visit
Admission: EM | Admit: 2021-07-29 | Discharge: 2021-07-29 | Disposition: A | Discharge status: Home / Self Care | Payer: No Typology Code available for payment source

## 2021-07-29 ENCOUNTER — Emergency Department (HOSPITAL_BASED_OUTPATIENT_CLINIC_OR_DEPARTMENT_OTHER): Payer: No Typology Code available for payment source

## 2021-07-29 DIAGNOSIS — J189 Pneumonia, unspecified organism: Secondary | ICD-10-CM | POA: Diagnosis not present

## 2021-07-29 DIAGNOSIS — Z7984 Long term (current) use of oral hypoglycemic drugs: Secondary | ICD-10-CM | POA: Insufficient documentation

## 2021-07-29 DIAGNOSIS — N189 Chronic kidney disease, unspecified: Secondary | ICD-10-CM | POA: Insufficient documentation

## 2021-07-29 DIAGNOSIS — Z7982 Long term (current) use of aspirin: Secondary | ICD-10-CM | POA: Diagnosis not present

## 2021-07-29 DIAGNOSIS — E1122 Type 2 diabetes mellitus with diabetic chronic kidney disease: Secondary | ICD-10-CM | POA: Insufficient documentation

## 2021-07-29 DIAGNOSIS — I2510 Atherosclerotic heart disease of native coronary artery without angina pectoris: Secondary | ICD-10-CM | POA: Insufficient documentation

## 2021-07-29 DIAGNOSIS — R059 Cough, unspecified: Secondary | ICD-10-CM | POA: Diagnosis present

## 2021-07-29 DIAGNOSIS — E860 Dehydration: Secondary | ICD-10-CM | POA: Insufficient documentation

## 2021-07-29 DIAGNOSIS — Z20822 Contact with and (suspected) exposure to covid-19: Secondary | ICD-10-CM | POA: Insufficient documentation

## 2021-07-29 DIAGNOSIS — I130 Hypertensive heart and chronic kidney disease with heart failure and stage 1 through stage 4 chronic kidney disease, or unspecified chronic kidney disease: Secondary | ICD-10-CM | POA: Diagnosis not present

## 2021-07-29 DIAGNOSIS — Z79899 Other long term (current) drug therapy: Secondary | ICD-10-CM | POA: Diagnosis not present

## 2021-07-29 DIAGNOSIS — I509 Heart failure, unspecified: Secondary | ICD-10-CM | POA: Insufficient documentation

## 2021-07-29 LAB — URINALYSIS, ROUTINE W REFLEX MICROSCOPIC
Bilirubin Urine: NEGATIVE
Glucose, UA: >=500 mg/dL — AB
Hgb urine dipstick: NEGATIVE
Ketones, ur: 15 mg/dL — AB
Leukocytes,Ua: NEGATIVE
Nitrite: NEGATIVE
Protein, ur: NEGATIVE mg/dL
Specific Gravity, Urine: 1.015 (ref 1.005–1.030)
pH: 5.5 (ref 5.0–8.0)

## 2021-07-29 LAB — I-STAT VENOUS BLOOD GAS, ED
Acid-Base Excess: 1 mmol/L (ref 0.0–2.0)
Bicarbonate: 27.2 mmol/L (ref 20.0–28.0)
Calcium, Ion: 1.24 mmol/L (ref 1.15–1.40)
HCT: 47 % (ref 39.0–52.0)
Hemoglobin: 16 g/dL (ref 13.0–17.0)
O2 Saturation: 29 %
Patient temperature: 101.3
Potassium: 4.3 mmol/L (ref 3.5–5.1)
Sodium: 138 mmol/L (ref 135–145)
TCO2: 29 mmol/L (ref 22–32)
pCO2, Ven: 52.3 mmHg (ref 44–60)
pH, Ven: 7.332 (ref 7.25–7.43)
pO2, Ven: 22 mmHg — CL (ref 32–45)

## 2021-07-29 LAB — CBC WITH DIFFERENTIAL/PLATELET
Abs Immature Granulocytes: 0.03 10*3/uL (ref 0.00–0.07)
Basophils Absolute: 0 10*3/uL (ref 0.0–0.1)
Basophils Relative: 1 %
Eosinophils Absolute: 0 10*3/uL (ref 0.0–0.5)
Eosinophils Relative: 1 %
HCT: 49 % (ref 39.0–52.0)
Hemoglobin: 16.4 g/dL (ref 13.0–17.0)
Immature Granulocytes: 1 %
Lymphocytes Relative: 18 %
Lymphs Abs: 1.1 10*3/uL (ref 0.7–4.0)
MCH: 30.2 pg (ref 26.0–34.0)
MCHC: 33.5 g/dL (ref 30.0–36.0)
MCV: 90.2 fL (ref 80.0–100.0)
Monocytes Absolute: 0.6 10*3/uL (ref 0.1–1.0)
Monocytes Relative: 10 %
Neutro Abs: 4.3 10*3/uL (ref 1.7–7.7)
Neutrophils Relative %: 69 %
Platelets: 145 10*3/uL — ABNORMAL LOW (ref 150–400)
RBC: 5.43 MIL/uL (ref 4.22–5.81)
RDW: 12.4 % (ref 11.5–15.5)
Smear Review: ADEQUATE
WBC: 6.1 10*3/uL (ref 4.0–10.5)
nRBC: 0 % (ref 0.0–0.2)

## 2021-07-29 LAB — COMPREHENSIVE METABOLIC PANEL
ALT: 38 U/L (ref 0–44)
AST: 59 U/L — ABNORMAL HIGH (ref 15–41)
Albumin: 4.6 g/dL (ref 3.5–5.0)
Alkaline Phosphatase: 72 U/L (ref 38–126)
Anion gap: 11 (ref 5–15)
BUN: 21 mg/dL (ref 8–23)
CO2: 22 mmol/L (ref 22–32)
Calcium: 9.5 mg/dL (ref 8.9–10.3)
Chloride: 102 mmol/L (ref 98–111)
Creatinine, Ser: 1.78 mg/dL — ABNORMAL HIGH (ref 0.61–1.24)
GFR, Estimated: 42 mL/min — ABNORMAL LOW (ref >60)
Glucose, Bld: 163 mg/dL — ABNORMAL HIGH (ref 70–99)
Potassium: 4.1 mmol/L (ref 3.5–5.1)
Sodium: 135 mmol/L (ref 135–145)
Total Bilirubin: 1.8 mg/dL — ABNORMAL HIGH (ref 0.3–1.2)
Total Protein: 8.6 g/dL — ABNORMAL HIGH (ref 6.5–8.1)

## 2021-07-29 LAB — URINALYSIS, MICROSCOPIC (REFLEX)
Bacteria, UA: NONE SEEN
Squamous Epithelial / HPF: NONE SEEN (ref 0–5)

## 2021-07-29 LAB — GROUP A STREP BY PCR: Group A Strep by PCR: NOT DETECTED

## 2021-07-29 LAB — RESP PANEL BY RT-PCR (FLU A&B, COVID) ARPGX2
Influenza A by PCR: NEGATIVE
Influenza B by PCR: NEGATIVE
SARS Coronavirus 2 by RT PCR: NEGATIVE

## 2021-07-29 LAB — APTT: aPTT: 31 seconds (ref 24–36)

## 2021-07-29 LAB — PROTIME-INR
INR: 1 (ref 0.8–1.2)
Prothrombin Time: 13.2 seconds (ref 11.4–15.2)

## 2021-07-29 LAB — LACTIC ACID, PLASMA: Lactic Acid, Venous: 1.5 mmol/L (ref 0.5–1.9)

## 2021-07-29 MED ORDER — SODIUM CHLORIDE 0.9 % IV BOLUS: 1000.0000 mL | Freq: Once | INTRAVENOUS | Status: DC

## 2021-07-29 MED ORDER — ACETAMINOPHEN 325 MG PO TABS
650.0000 mg | ORAL_TABLET | Freq: Once | ORAL | Status: AC | PRN
  Administered 2021-07-29: 650 mg via ORAL
  Filled 2021-07-29: qty 2

## 2021-07-29 MED ORDER — SODIUM CHLORIDE 0.9 % IV SOLN
500.0000 mg | INTRAVENOUS | Status: DC
  Administered 2021-07-29: 500 mg via INTRAVENOUS
  Filled 2021-07-29: qty 5

## 2021-07-29 MED ORDER — LACTATED RINGERS IV BOLUS (SEPSIS)
1000.0000 mL | Freq: Once | INTRAVENOUS | Status: AC
  Administered 2021-07-29: 1000 mL via INTRAVENOUS

## 2021-07-29 MED ORDER — ALBUTEROL SULFATE HFA 108 (90 BASE) MCG/ACT IN AERS: 1.0000 | INHALATION_SPRAY | Freq: Four times a day (QID) | RESPIRATORY_TRACT | 0 refills | Status: DC | PRN

## 2021-07-29 MED ORDER — LACTATED RINGERS IV SOLN: INTRAVENOUS | Status: DC

## 2021-07-29 MED ORDER — AZITHROMYCIN 250 MG PO TABS: 250.0000 mg | ORAL_TABLET | Freq: Every day | ORAL | 0 refills | Status: DC

## 2021-07-29 MED ORDER — SODIUM CHLORIDE 0.9 % IV SOLN
2.0000 g | INTRAVENOUS | Status: DC
  Administered 2021-07-29: 2 g via INTRAVENOUS
  Filled 2021-07-29: qty 20

## 2021-07-29 MED ORDER — KETOROLAC TROMETHAMINE 15 MG/ML IJ SOLN
15.0000 mg | Freq: Once | INTRAMUSCULAR | Status: AC
  Administered 2021-07-29: 15 mg via INTRAVENOUS
  Filled 2021-07-29: qty 1

## 2021-07-29 MED ORDER — AMOXICILLIN-POT CLAVULANATE 875-125 MG PO TABS: 1.0000 | ORAL_TABLET | Freq: Two times a day (BID) | ORAL | 0 refills | Status: DC

## 2021-07-29 NOTE — ED Notes (Signed)
Patient is being discharged from the Urgent Care and sent to the Emergency Department via Personal Vehicle with wife . Per Lou Cal, patient is in need of higher level of care due to Oxygen Saturation, Fever, Symptoms, and History. Patient is aware and verbalizes understanding of plan of care.  ?Vitals:  ? 07/29/21 1608  ?BP: (!) 143/74  ?Pulse: 100  ?Resp: 18  ?Temp: (!) 103 ?F (39.4 ?C)  ?SpO2: 90%  ?  ?

## 2021-07-29 NOTE — Sepsis Progress Note (Signed)
Following for sepsis monitoring ?

## 2021-07-29 NOTE — Discharge Instructions (Addendum)
It was a pleasure caring for you today in the emergency department. ° °Please return to the emergency department for any worsening or worrisome symptoms. ° ° °

## 2021-07-29 NOTE — ED Triage Notes (Addendum)
C/o chest congestion, cough, sore throat, weakness x 3 days. Fever tmax 115f. Recent exposure to niece who has strep ?

## 2021-07-29 NOTE — ED Provider Notes (Signed)
?[facility] ?Provider Note ? ? ?CSN: [id number] ?Arrival date & time: 07/29/21  1633 ? ?  ? ?History ? ?Chief Complaint  ?Patient presents with  ? Fever  ? ? ?Michael Guerra is a 61 y.o. male. ? ? Patient as above with significant medical history as below, including CAD, CKD, CHF, HTN who presents to the ED with complaint of cough, congestion, dyspnea, fevers, chills, weakness. ? ?Onset of symptoms 3 days ago.  Positive sick contacts with spouse and child.  They were having URI type symptoms but resolved after couple days, his symptoms persisted.  Progressively worsening.  He is having fever Tmax 102 at home, shaking chills/rigors the last 2 days.  Nonproductive cough, chest congestion, mild exertional dyspnea.  Generalized malaise/weakness, arthralgias, myalgias.  No recent medication or diet changes.  No falls.  No recent travel  ? ? ?Past Medical History: ?06/08/2019: CAD (coronary artery disease) ?05/23/2019: Cardiomyopathy ([place]) ?No date: Cataract ?05/12/2019: Chest discomfort ?No date: CHF (congestive heart failure) ([place]) ?No date: Chronic kidney disease ?    Comment:  one kidney that is healthy ?02/05/2020: Chronic left shoulder pain ?05/12/2019: Diabetes mellitus due to underlying condition with  ?unspecified complications ([place]) ?6/56/8127: Essential hypertension ?06/22/2019: Foot sprain, left, initial encounter ?05/21/2006: LIVER FUNCTION TESTS, ABNORMAL ?    Comment:  Qualifier: Diagnosis of  By: Annamaria Boots MD, Ysidro Evert   ?05/12/2019: Mixed dyslipidemia ?05/20/2011: Other specified abnormal findings of blood chemistry ?    Comment:  Formatting of this note might be different from the  ?             original. Elevated liver function tests 10/1 IMO update ?05/20/2011: Rectal polyp ?    Comment:  Formatting of this note might be different from the  ?             original. Noted march 2011 fu in march 2016 ?05/23/2019: Renal insufficiency ?05/20/2011: Skin lesion of chest wall ?    Comment:  Formatting  of this note might be different from the  ?             original. Mid chest (prob seb/epidermoid cyst) ?No date: Solitary kidney ?02/17/2006: Type 2 diabetes mellitus with hyperglycemia, without Michael- ?term current use of insulin ([place]) ?    Comment:  Qualifier: Diagnosis of  By: Annamaria Boots MD, Ysidro Evert   ? ?Past Surgical History: ?No date: ABDOMINAL SURGERY ?No date: KIDNEY SURGERY ?    Comment:  bowel obstruction  x 3 ?05/31/2019: LEFT HEART CATH AND CORONARY ANGIOGRAPHY; N/A ?    Comment:  Procedure: LEFT HEART CATH AND CORONARY ANGIOGRAPHY;   ?             Surgeon: Wellington Hampshire, MD;  Location: [facility] ?             LAB;  Service: Cardiovascular;  Laterality: N/A;  ? ? ?The history is provided by the spouse and the patient. No language interpreter was used.  ?Fever ?Associated symptoms: chills, congestion and cough   ?Associated symptoms: no chest pain, no confusion, no headaches, no nausea, no rash and no vomiting   ? ?  ? ?Home Medications ?Prior to Admission medications   ?Medication Sig Start Date End Date Taking? Authorizing Provider  ?albuterol (VENTOLIN HFA) 108 (90 Base) MCG/ACT inhaler Inhale 1-2 puffs into the lungs every 6 (six) hours as needed for wheezing or shortness of breath. 07/29/21  Yes Wynona Dove A, DO  ?amoxicillin-clavulanate (AUGMENTIN)  875-125 MG tablet Take 1 tablet by mouth every 12 (twelve) hours. 07/30/21  Yes Wynona Dove A, DO  ?azithromycin (ZITHROMAX Z-PAK) 250 MG tablet Take 1 tablet (250 mg total) by mouth daily. Per manufacturer's instructions 07/30/21  Yes Wynona Dove A, DO  ?acetaminophen (TYLENOL) 500 MG tablet Take 1,000 mg by mouth every 6 (six) hours as needed for moderate pain or headache.    [provider]  ?aspirin 81 MG tablet Take 81 mg by mouth daily.    [provider]  ?atorvastatin (LIPITOR) 40 MG tablet Take 1 tablet (40 mg total) by mouth daily. 06/04/21   Shelda Pal, DO  ?Blood Glucose Monitoring Suppl (FREESTYLE LITE) w/Device  KIT Use daily to check blood sugar 03/10/21   Wendling, Crosby Oyster, DO  ?carvedilol (COREG) 25 MG tablet Take 1 tablet (25 mg total) by mouth 2 (two) times daily with a meal. 12/19/20   Larey Dresser, MD  ?COVID-19 mRNA bivalent vaccine, Pfizer, (PFIZER COVID-19 VAC BIVALENT) injection Inject into the muscle. 06/05/21   Carlyle Basques, MD  ?dapagliflozin propanediol (FARXIGA) 10 MG TABS tablet Take 1 tablet (10 mg total) by mouth daily. 06/04/21   Shelda Pal, DO  ?glucose blood (FREESTYLE LITE) test strip Use daily to check blood sugar 03/10/21   Shelda Pal, DO  ?Lactobacillus (PROBIOTIC ACIDOPHILUS PO) Take 1 tablet by mouth daily in the afternoon.    [provider]  ?Lancets (FREESTYLE) lancets Use to check blood sugars daily 03/10/21   Shelda Pal, DO  ?Melatonin 10 MG CAPS Take 30 mg by mouth at bedtime as needed for sleep (sleep).    [provider]  ?metFORMIN (GLUCOPHAGE-XR) 500 MG 24 hr tablet Take 2 tablets (1,000 mg total) by mouth in the morning and at bedtime. 06/05/21   Shelda Pal, DO  ?Multiple Vitamins-Minerals (MULTIVITAMIN ADULT) TABS Take 1 tablet by mouth daily with lunch.     [provider]  ?mupirocin ointment (BACTROBAN) 2 % 1 application as needed.    [provider]  ?NEEDLE, DISP, 22 G (B-D HYPODERMIC NEEDLE 22GX1") 22G X 1" MISC Use as directed with testosterone. 06/25/21     ?NEEDLE, DISP, 22 G (EASY TOUCH HYPODERMIC NEEDLE) 22G X 1-1/2" MISC Use as directed with testosterone. 06/25/21     ?Omega-3 Fatty Acids (FISH OIL) 1000 MG CAPS Take 1,000 mg by mouth daily.    [provider]  ?sacubitril-valsartan (ENTRESTO) 97-103 MG Take 1 tablet by mouth 2 (two) times daily. 05/07/21   Larey Dresser, MD  ?spironolactone (ALDACTONE) 25 MG tablet Take 1 tablet (25 mg total) by mouth daily. 06/25/21   Shelda Pal, DO  ?SYRINGE-NEEDLE, DISP, 3 ML (B-D 3CC LUER-LOK SYR 18GX1-1/2) 18G X 1-1/2" 3  ML MISC Use as directed with testosterone. 06/25/21     ?testosterone cypionate (DEPOTESTOSTERONE CYPIONATE) 200 MG/ML injection Inject 0.68ms into the muscle once weekly 06/20/21   HArdis Hughs MD  ?Testosterone Cypionate 200 MG/ML SOLN Inject 0.8 mLs as directed once a week.    [provider]  ?triamcinolone ointment (KENALOG) 0.1 % Apply 1 application topically 2 (two) times daily as needed for rash.    [provider]  ?   ? ?Allergies    ?Lisinopril   ? ?Review of Systems   ?Review of Systems  ?Constitutional:  Positive for chills and fever.  ?HENT:  Positive for congestion. Negative for facial swelling and trouble swallowing.   ?Eyes:  Negative for photophobia and visual disturbance.  ?Respiratory:  Positive for cough and shortness of breath.   ?Cardiovascular:  Negative for chest pain and palpitations.  ?Gastrointestinal:  Negative for abdominal pain, nausea and vomiting.  ?Endocrine: Negative for polydipsia and polyuria.  ?Genitourinary:  Negative for difficulty urinating and hematuria.  ?Musculoskeletal:  Positive for arthralgias. Negative for gait problem and joint swelling.  ?Skin:  Negative for pallor and rash.  ?Neurological:  Negative for syncope and headaches.  ?Psychiatric/Behavioral:  Negative for agitation and confusion.   ? ?Physical Exam ?Updated Vital Signs ?BP (!) 125/97   Pulse 94   Temp (!) 101.3 ?F (38.5 ?C) (Oral)   Resp 20   Ht 6' 1" (1.854 m)   Wt 88 kg   SpO2 96%   BMI 25.60 kg/m?  ?Physical Exam ?Vitals and nursing note reviewed.  ?Constitutional:   ?   General: He is not in acute distress. ?   Appearance: Normal appearance. He is well-developed. He is ill-appearing.  ?HENT:  ?   Head: Normocephalic and atraumatic.  ?   Comments: Mucous membranes are moist ?   Right Ear: External ear normal.  ?   Left Ear: External ear normal.  ?   Mouth/Throat:  ?   Mouth: Mucous membranes are moist.  ?Eyes:  ?   General: No scleral icterus. ?Cardiovascular:  ?   Rate and  Rhythm: Normal rate and regular rhythm.  ?   Pulses: Normal pulses.     ?     Radial pulses are 2+ on the right side and 2+ on the left side.  ?     Posterior tibial pulses are 2+ on the right side and 2+

## 2021-07-29 NOTE — Discharge Instructions (Signed)
13

## 2021-07-29 NOTE — ED Provider Notes (Signed)
?  [facility] ? ? ? ?CSN: [id number] ?Arrival date & time: 07/29/21  1538 ?  ? ?HISTORY  ?No chief complaint on file. ? ?HPI ?Michael Guerra is a 61 y.o. male.  Patient presents to urgent care with a chief complaint of cough, chest congestion, sore throat and weakness for the past 2 to 3 days.  Patient is a temperature of 103 on arrival with an oxygenation varying between 90 and 92%, 92% with deep inspiration and 90% at rest.  Patient was weak on arrival and required assistance with ambulation from the waiting room to the exam room.  Patient was briefly evaluated by provider, breath sounds were diffusely decreased with mild rales throughout, patient also demonstrated increased work of breathing.   ? ?Patient was advised that I believe he would best benefit from being seen and evaluated in the emergency room for viral testing, possible ABGs and blood work and chest x-ray with stat rate.  Patient states he was accompanied by his wife today and agreed to go to the med center at [facility] now for further evaluation. ? ? ?This office note has been dictated using Teaching laboratory technician.  Unfortunately, and despite my best efforts, this method of dictation can sometimes lead to occasional typographical or grammatical errors.  I apologize in advance if this occurs.  ? ?  ?Theadora Rama Scales, PA-C ?07/29/21 1623 ? ?

## 2021-07-29 NOTE — ED Triage Notes (Addendum)
Patient c/o nonproductive cough, sore throat, and weakness x 2-3 days.  ? ?Patient denies N/V/D.  ? ?Patient endorses chest pain when coughing.  ? ?Patient endorses chest congestion. ? ?Patient has taken Mucinex ( last dose 0800 today)and Dayquil (last dose yesterday)  with no relief of symptoms.  ? ?History of DM.  ?

## 2021-08-03 LAB — CULTURE, BLOOD (ROUTINE X 2)
Culture: NO GROWTH
Culture: NO GROWTH

## 2021-08-04 ENCOUNTER — Other Ambulatory Visit (HOSPITAL_BASED_OUTPATIENT_CLINIC_OR_DEPARTMENT_OTHER): Payer: Self-pay

## 2021-08-04 ENCOUNTER — Encounter: Payer: Self-pay | Admitting: Family Medicine

## 2021-08-04 ENCOUNTER — Ambulatory Visit (INDEPENDENT_AMBULATORY_CARE_PROVIDER_SITE_OTHER): Payer: No Typology Code available for payment source | Admitting: Family Medicine

## 2021-08-04 VITALS — BP 120/82 | HR 73 | Temp 98.4°F | Ht 73.0 in | Wt 199.4 lb

## 2021-08-04 DIAGNOSIS — Z09 Encounter for follow-up examination after completed treatment for conditions other than malignant neoplasm: Secondary | ICD-10-CM

## 2021-08-04 NOTE — Progress Notes (Signed)
Chief Complaint  ?Patient presents with  ? ED followup  ? Pneumonia  ? ? ?Subjective: ?Patient is a 61 y.o. male here for ED f/u. ? ?Dx'd w CAP, finished Zpak yesterday. Has one day of Augmentin left. Lingering coughing, SABA was helping. Improving overall. Eating/drinking normally. Having diarrhea from the abx. No fevers.  ? ?Past Medical History:  ?Diagnosis Date  ? CAD (coronary artery disease) 06/08/2019  ? Cardiomyopathy ([place]) 05/23/2019  ? Cataract   ? Chest discomfort 05/12/2019  ? CHF (congestive heart failure) ([place])   ? Chronic kidney disease   ? one kidney that is healthy  ? Chronic left shoulder pain 02/05/2020  ? Diabetes mellitus due to underlying condition with unspecified complications ([place]) 0000000  ? Essential hypertension 05/12/2019  ? Foot sprain, left, initial encounter 06/22/2019  ? LIVER FUNCTION TESTS, ABNORMAL 05/21/2006  ? Qualifier: Diagnosis of  By: Annamaria Boots MD, Ysidro Evert    ? Mixed dyslipidemia 05/12/2019  ? Other specified abnormal findings of blood chemistry 05/20/2011  ? Formatting of this note might be different from the original. Elevated liver function tests 10/1 IMO update  ? Rectal polyp 05/20/2011  ? Formatting of this note might be different from the original. Noted march 2011 fu in march 2016  ? Renal insufficiency 05/23/2019  ? Skin lesion of chest wall 05/20/2011  ? Formatting of this note might be different from the original. Mid chest (prob seb/epidermoid cyst)  ? Solitary kidney   ? Type 2 diabetes mellitus with hyperglycemia, without long-term current use of insulin ([place]) 02/17/2006  ? Qualifier: Diagnosis of  By: Annamaria Boots MD, Ysidro Evert    ? ? ?Objective: ?BP 120/82   Pulse 73   Temp 98.4 ?F (36.9 ?C) (Oral)   Ht 6\' 1"  (1.854 m)   Wt 199 lb 6 oz (90.4 kg)   SpO2 97%   BMI 26.30 kg/m?  ?General: Awake, appears stated age ?Heart: RRR, no LE edema ?Lungs: CTAB, no rales, wheezes or rhonchi. No accessory muscle use ?Psych: Age appropriate judgment and insight, normal affect and  mood ? ?Assessment and Plan: ?Examination of, follow-up ? ?Steadily improving. Having diarrhea. Imodium prn. Resume probiotic for next week. No need for follow up imaging as CXR was neg in ED, likely had bacterial bronchitis.  ?The patient voiced understanding and agreement to the plan. ? ?Shelda Pal, DO ?08/04/21  ?4:08 PM ? ? ? ? ?

## 2021-08-04 NOTE — Patient Instructions (Addendum)
Xray looks good.  ? ?Imodium as needed while on the Augmentin. For next week, go back on probiotics.  ? ?Let us know if you need anything. ?

## 2021-08-15 ENCOUNTER — Other Ambulatory Visit (HOSPITAL_BASED_OUTPATIENT_CLINIC_OR_DEPARTMENT_OTHER): Payer: Self-pay

## 2021-08-26 ENCOUNTER — Other Ambulatory Visit: Payer: Self-pay

## 2021-09-01 ENCOUNTER — Ambulatory Visit (INDEPENDENT_AMBULATORY_CARE_PROVIDER_SITE_OTHER): Payer: No Typology Code available for payment source | Admitting: Family Medicine

## 2021-09-01 ENCOUNTER — Encounter: Payer: Self-pay | Admitting: Family Medicine

## 2021-09-01 ENCOUNTER — Other Ambulatory Visit (HOSPITAL_BASED_OUTPATIENT_CLINIC_OR_DEPARTMENT_OTHER): Payer: Self-pay

## 2021-09-01 VITALS — BP 120/82 | HR 60 | Temp 97.1°F | Ht 73.0 in | Wt 201.0 lb

## 2021-09-01 DIAGNOSIS — E088 Diabetes mellitus due to underlying condition with unspecified complications: Secondary | ICD-10-CM

## 2021-09-01 DIAGNOSIS — E782 Mixed hyperlipidemia: Secondary | ICD-10-CM

## 2021-09-01 DIAGNOSIS — E1165 Type 2 diabetes mellitus with hyperglycemia: Secondary | ICD-10-CM

## 2021-09-01 LAB — HEMOGLOBIN A1C: Hgb A1c MFr Bld: 7.9 % — ABNORMAL HIGH (ref 4.6–6.5)

## 2021-09-01 NOTE — Patient Instructions (Addendum)
Give [place] 2-3 business days to get the results of your labs back.  ? ?Keep the diet clean and stay active. ? ?Consider a metatarsal pad to wear during work hours. ? ?Let us know if you need anything. ?

## 2021-09-01 NOTE — Progress Notes (Addendum)
Subjective:   Chief Complaint  Patient presents with   Follow-up    6 month     Michael Guerra is a 61 y.o. male here for follow-up of diabetes.   Michael Guerra's self monitored glucose range is low 100's.  Patient denies hypoglycemic reactions. He checks his glucose levels 1-3 times per week. Patient does not require insulin.   Medications include: Farxiga 10 mg/d, Metformin XR 1000 mg bid Diet is overall healthy.  Exercise: active at work, some cardio, lifting weights No CP or SOB.   Mixed dyslipidemia Patient presents for mixed dyslipidemia follow up. Currently being treated with Lipitor 40 mg/d and compliance with treatment thus far has been good. He denies myalgias. Diet/exercise as above.  The patient is known to have coexisting coronary artery disease.  Past Medical History:  Diagnosis Date   CAD (coronary artery disease) 06/08/2019   Cardiomyopathy (HCC) 05/23/2019   Cataract    Chest discomfort 05/12/2019   CHF (congestive heart failure) (HCC)    Chronic kidney disease    one kidney that is healthy   Chronic left shoulder pain 02/05/2020   Diabetes mellitus due to underlying condition with unspecified complications (HCC) 05/12/2019   Essential hypertension 05/12/2019   Foot sprain, left, initial encounter 06/22/2019   Hyperlipidemia 02/17/2006   Qualifier: Diagnosis of  By: Maple Hudson MD, Riki Rusk     LIVER FUNCTION TESTS, ABNORMAL 05/21/2006   Qualifier: Diagnosis of  By: Maple Hudson MD, Riki Rusk     Mixed dyslipidemia 05/12/2019   Other specified abnormal findings of blood chemistry 05/20/2011   Formatting of this note might be different from the original. Elevated liver function tests 10/1 IMO update   Rectal polyp 05/20/2011   Formatting of this note might be different from the original. Noted march 2011 fu in march 2016   Renal insufficiency 05/23/2019   Skin lesion of chest wall 05/20/2011   Formatting of this note might be different from the original. Mid chest (prob seb/epidermoid cyst)    Solitary kidney    Type 2 diabetes mellitus with hyperglycemia, without long-term current use of insulin (HCC) 02/17/2006   Qualifier: Diagnosis of  By: Maple Hudson MD, Riki Rusk       Related testing: Retinal exam: Done Pneumovax: done  Objective:  BP 120/82   Pulse 60   Temp (!) 97.1 F (36.2 C) (Oral)   Ht 6\' 1"  (1.854 m)   Wt 201 lb (91.2 kg)   SpO2 97%   BMI 26.52 kg/m  General:  Well developed, well nourished, in no apparent distress Skin:  Warm, no pallor or diaphoresis Head:  Normocephalic, atraumatic Eyes:  Pupils equal and round, sclera anicteric without injection  Lungs:  CTAB, no access msc use Cardio:  RRR, no bruits, no LE edema Musculoskeletal:  Symmetrical muscle groups noted without atrophy or deformity Neuro:  Sensation intact to pinprick on feet Psych: Age appropriate judgment and insight  Assessment:   Type 2 diabetes mellitus with hyperglycemia, without long-term current use of insulin (HCC) - Plan: Lipid panel, Hemoglobin A1c, Comprehensive metabolic panel  Mixed dyslipidemia   Plan:   Chronic, stable. Cont Farxiga 10 mg/d, Metformin XR 1000 mg bid. Counseled on diet and exercise. Chronic, stable. Cont Lipitor 40 mg/d.  F/u in 6 mo. The patient voiced understanding and agreement to the plan.  [place], DO 09/04/21 2:04 PM

## 2021-09-02 ENCOUNTER — Ambulatory Visit (INDEPENDENT_AMBULATORY_CARE_PROVIDER_SITE_OTHER): Payer: No Typology Code available for payment source

## 2021-09-02 ENCOUNTER — Ambulatory Visit (INDEPENDENT_AMBULATORY_CARE_PROVIDER_SITE_OTHER): Payer: No Typology Code available for payment source | Admitting: Cardiology

## 2021-09-02 ENCOUNTER — Encounter: Payer: Self-pay | Admitting: Cardiology

## 2021-09-02 ENCOUNTER — Other Ambulatory Visit (HOSPITAL_BASED_OUTPATIENT_CLINIC_OR_DEPARTMENT_OTHER): Payer: Self-pay

## 2021-09-02 VITALS — BP 142/70 | HR 72 | Ht 73.0 in | Wt 197.1 lb

## 2021-09-02 DIAGNOSIS — I255 Ischemic cardiomyopathy: Secondary | ICD-10-CM

## 2021-09-02 DIAGNOSIS — R002 Palpitations: Secondary | ICD-10-CM | POA: Diagnosis not present

## 2021-09-02 DIAGNOSIS — I10 Essential (primary) hypertension: Secondary | ICD-10-CM

## 2021-09-02 DIAGNOSIS — E1165 Type 2 diabetes mellitus with hyperglycemia: Secondary | ICD-10-CM

## 2021-09-02 DIAGNOSIS — E782 Mixed hyperlipidemia: Secondary | ICD-10-CM

## 2021-09-02 LAB — COMPREHENSIVE METABOLIC PANEL
ALT: 32 U/L (ref 0–53)
AST: 40 U/L — ABNORMAL HIGH (ref 0–37)
Albumin: 4.2 g/dL (ref 3.5–5.2)
Alkaline Phosphatase: 52 U/L (ref 39–117)
BUN: 18 mg/dL (ref 6–23)
CO2: 27 mEq/L (ref 19–32)
Calcium: 9.7 mg/dL (ref 8.4–10.5)
Chloride: 102 mEq/L (ref 96–112)
Creatinine, Ser: 1.54 mg/dL — ABNORMAL HIGH (ref 0.40–1.50)
GFR: 47.78 mL/min — ABNORMAL LOW (ref >60.00)
Glucose, Bld: 134 mg/dL — ABNORMAL HIGH (ref 70–99)
Potassium: 4.7 mEq/L (ref 3.5–5.1)
Sodium: 138 mEq/L (ref 135–145)
Total Bilirubin: 1.6 mg/dL — ABNORMAL HIGH (ref 0.2–1.2)
Total Protein: 6.9 g/dL (ref 6.0–8.3)

## 2021-09-02 LAB — LIPID PANEL
Cholesterol: 97 mg/dL (ref 0–200)
HDL: 44.8 mg/dL (ref >39.00)
LDL Cholesterol: 38 mg/dL (ref 0–99)
NonHDL: 51.9
Total CHOL/HDL Ratio: 2
Triglycerides: 68 mg/dL (ref 0.0–149.0)
VLDL: 13.6 mg/dL (ref 0.0–40.0)

## 2021-09-02 MED ORDER — SPIRONOLACTONE 25 MG PO TABS
12.5000 mg | ORAL_TABLET | Freq: Every day | ORAL | 3 refills | Status: DC
  Filled 2021-09-02 – 2021-09-08 (×2): qty 30, 60d supply, fill #0
  Filled 2021-11-27: qty 30, 60d supply, fill #1
  Filled 2022-05-26: qty 30, 60d supply, fill #2
  Filled 2022-08-14: qty 30, 60d supply, fill #3

## 2021-09-02 NOTE — Patient Instructions (Addendum)
Please keep a BP log for 2 weeks and send by MyChart or mail. ? ?Blood Pressure Record Sheet ?To take your blood pressure, you will need a blood pressure machine. You can buy a blood pressure machine (blood pressure monitor) at your clinic, drug store, or online. When choosing one, consider: ?An automatic monitor that has an arm cuff. ?A cuff that wraps snugly around your upper arm. You should be able to fit only one finger between your arm and the cuff. ?A device that stores blood pressure reading results. ?Do not choose a monitor that measures your blood pressure from your wrist or finger. ?Follow your health care provider's instructions for how to take your blood pressure. To use this form: ?Get one reading in the morning (a.m.) 1-2 hours after you take any medicines. ?Get one reading in the evening (p.m.) before supper. ?Write down the results in the spaces on this form. ?Repeat this once a week, or as told by your health care provider. ? ?Make a follow-up appointment with your health care provider to discuss the results. ?Blood pressure log ?Date: _______________________ ?a.m. _____________________(1st reading) HR___________ ? ?          p.m. _____________________(2nd reading) HR__________ ? ?Date: _______________________ ?a.m. _____________________(1st reading) HR___________ ? ?          p.m. _____________________(2nd reading) HR__________ ?Date: _______________________ ?a.m. _____________________(1st reading) HR___________ ? ?          p.m. _____________________(2nd reading) HR__________ ?Date: _______________________ ?a.m. _____________________(1st reading) HR___________ ? ?          p.m. _____________________(2nd reading) HR__________ ? ?Date: _______________________ ?a.m. _____________________(1st reading) HR___________ ? ?          p.m. _____________________(2nd reading) HR__________ ? ?Date: _______________________ ?a.m. _____________________(1st reading) HR___________ ? ?          p.m.  _____________________(2nd reading) HR__________ ? ?Date: _______________________ ?a.m. _____________________(1st reading) HR___________ ? ?          p.m. _____________________(2nd reading) HR__________ ? ? ?This information is not intended to replace advice given to you by your health care provider. Make sure you discuss any questions you have with your health care provider. ?Document Revised: 07/26/2019 Document Reviewed: 07/26/2019 ?Elsevier Patient Education ? 2021 [employer]  ? ?Medication Instructions:  ?Your physician has recommended you make the following change in your medication:  ? ?Decrease your Spironolactone to 12.5 mg daily. Take 1/2 tablet of you current dose. ? ?*If you need a refill on your cardiac medications before your next appointment, please call your pharmacy* ? ? ?Lab Work: ?None ordered ?If you have labs (blood work) drawn today and your tests are completely normal, you will receive your results only by: ?MyChart Message (if you have MyChart) OR ?A paper copy in the mail ?If you have any lab test that is abnormal or we need to change your treatment, we will call you to review the results. ? ? ?Testing/Procedures: ?Your physician has recommended that you wear an event monitor. Event monitors are medical devices that record the heart?s electrical activity. Doctors most often [place] these monitors to diagnose arrhythmias. Arrhythmias are problems with the speed or rhythm of the heartbeat. The monitor is a small, portable device. You can wear one while you do your normal daily activities. This is usually used to diagnose what is causing palpitations/syncope (passing out). ? ? ? ?Follow-Up: ?At [facility], you and your health needs are our priority.  As part of our continuing mission to provide you with exceptional heart  care, we have created designated Provider Care Teams.  These Care Teams include your primary Cardiologist (physician) and Advanced Practice Providers (APPs -  Physician  Assistants and Nurse Practitioners) who all work together to provide you with the care you need, when you need it. ? ?We recommend signing up for the patient portal called "MyChart".  Sign up information is provided on this After Visit Summary.  MyChart is used to connect with patients for Virtual Visits (Telemedicine).  Patients are able to view lab/test results, encounter notes, upcoming appointments, etc.  Non-urgent messages can be sent to your provider as well.   ?To learn more about what you can do with MyChart, go to [email or web address].   ? ?Your next appointment:   ?9 month(s) ? ?The format for your next appointment:   ?In Person ? ?Provider:   ?Belva Crome, MD ? ? ?Other Instructions ?NA ? ?

## 2021-09-02 NOTE — Progress Notes (Signed)
?Cardiology Office Note:   ? ?Date:  09/02/2021  ? ?ID:  Michael Guerra, DOB Feb 19, 1961, MRN [id number] ? ?PCP:  Shelda Pal, DO  ?Cardiologist:  Jenean Lindau, MD  ? ?Referring MD: Shelda Pal*  ? ? ?ASSESSMENT:   ? ?1. Mixed hyperlipidemia   ?2. Essential hypertension   ?3. Ischemic cardiomyopathy   ?4. Palpitations   ?5. Type 2 diabetes mellitus with hyperglycemia, without long-term current use of insulin ([place])   ? ?PLAN:   ? ?In order of problems listed above: ? ?Cardiomyopathy: Ejection fraction is significantly better and I discussed this with the patient at length.  Patient is on guideline directed medical therapy.  He is happy about it.  Congestive heart failure education given to the patient. ?Essential hypertension: Blood pressure I think is a problem in this gentleman.  I told him that if he has any dizzy spells or lightheaded especially on such occasions it probably indicates orthostatic hypotension and salt and fluid intake in the diet such time was encouraged.  He also has had blood work and we will review this when available.  I told him to cut spironolactone to half dose.  He is given check of his blood pressure logs and get back for [place]. ?I think this is secondary to orthostatic hypotension but we will do a 2-week monitor especially in view of cardiomyopathy to consider possibly any arrhythmia.  He also gives some palpitation history. ?Mixed dyslipidemia: Lipids were discussed and diet was emphasized. ?Diabetes mellitus: Followed by primary care.  His hemoglobin A1c is elevated and recently 1 has been checked and the results are pending. ?Patient will be seen in follow-up appointment in 6 months or earlier if the patient has any concerns ? ? ? ?Medication Adjustments/Labs and Tests Ordered: ?Current medicines are reviewed at length with the patient today.  Concerns regarding medicines are outlined above.  ?Orders Placed This Encounter  ?Procedures  ? Cardiac event monitor   ? ?Meds ordered this encounter  ?Medications  ? spironolactone (ALDACTONE) 25 MG tablet  ?  Sig: Take 0.5 tablets (12.5 mg total) by mouth daily.  ?  Dispense:  30 tablet  ?  Refill:  3  ? ? ? ?No chief complaint on file. ?  ? ?History of Present Illness:   ? ?Michael Guerra is a 61 y.o. male.  Patient has past medical history of coronary artery disease, cardiomyopathy with significantly improved ejection fraction, essential hypertension, mixed dyslipidemia, diabetes mellitus.  Patient mentions to me that he has had passing out spells in the last couple of months.  He tells me that this happens when he takes something to help him sleep.  It happens only in the morning when he wakes up and his blood pressure is low.  No chest pain orthopnea PND dizziness or any such symptoms.  At the time of my evaluation, the patient is alert awake oriented and in no distress. ? ?Past Medical History:  ?Diagnosis Date  ? CAD (coronary artery disease) 06/08/2019  ? Cardiomyopathy ([place]) 05/23/2019  ? Cataract   ? Chest discomfort 05/12/2019  ? CHF (congestive heart failure) ([place])   ? Chronic kidney disease   ? one kidney that is healthy  ? Chronic left shoulder pain 02/05/2020  ? Diabetes mellitus due to underlying condition with unspecified complications ([place]) 0/76/8088  ? Essential hypertension 05/12/2019  ? Foot sprain, left, initial encounter 06/22/2019  ? Hyperlipidemia 02/17/2006  ? Qualifier: Diagnosis of  By: Annamaria Boots  MD, Ysidro Evert    ? LIVER FUNCTION TESTS, ABNORMAL 05/21/2006  ? Qualifier: Diagnosis of  By: Annamaria Boots MD, Ysidro Evert    ? Mixed dyslipidemia 05/12/2019  ? Other specified abnormal findings of blood chemistry 05/20/2011  ? Formatting of this note might be different from the original. Elevated liver function tests 10/1 IMO update  ? Rectal polyp 05/20/2011  ? Formatting of this note might be different from the original. Noted march 2011 fu in march 2016  ? Renal insufficiency 05/23/2019  ? Skin lesion of chest wall 05/20/2011  ? Formatting  of this note might be different from the original. Mid chest (prob seb/epidermoid cyst)  ? Solitary kidney   ? Type 2 diabetes mellitus with hyperglycemia, without long-term current use of insulin ([place]) 02/17/2006  ? Qualifier: Diagnosis of  By: Annamaria Boots MD, Ysidro Evert    ? ? ?Past Surgical History:  ?Procedure Laterality Date  ? ABDOMINAL SURGERY    ? KIDNEY SURGERY    ? bowel obstruction  x 3  ? LEFT HEART CATH AND CORONARY ANGIOGRAPHY N/A 05/31/2019  ? Procedure: LEFT HEART CATH AND CORONARY ANGIOGRAPHY;  Surgeon: Wellington Hampshire, MD;  Location: [facility];  Service: Cardiovascular;  Laterality: N/A;  ? ? ?Current Medications: ?Current Meds  ?Medication Sig  ? acetaminophen (TYLENOL) 500 MG tablet Take 1,000 mg by mouth every 6 (six) hours as needed for moderate pain or headache.  ? aspirin 81 MG tablet Take 81 mg by mouth daily.  ? atorvastatin (LIPITOR) 40 MG tablet Take 1 tablet (40 mg total) by mouth daily.  ? Blood Glucose Monitoring Suppl (FREESTYLE LITE) w/Device KIT Use daily to check blood sugar  ? carvedilol (COREG) 25 MG tablet Take 1 tablet (25 mg total) by mouth 2 (two) times daily with a meal.  ? dapagliflozin propanediol (FARXIGA) 10 MG TABS tablet Take 1 tablet (10 mg total) by mouth daily.  ? glucose blood (FREESTYLE LITE) test strip Use daily to check blood sugar  ? Lactobacillus (PROBIOTIC ACIDOPHILUS PO) Take 1 tablet by mouth daily in the afternoon.  ? Lancets (FREESTYLE) lancets Use to check blood sugars daily  ? Melatonin 10 MG CAPS Take 30 mg by mouth at bedtime as needed for sleep (sleep).  ? metFORMIN (GLUCOPHAGE-XR) 500 MG 24 hr tablet Take 2 tablets (1,000 mg total) by mouth in the morning and at bedtime.  ? Multiple Vitamins-Minerals (MULTIVITAMIN ADULT) TABS Take 1 tablet by mouth daily with lunch.   ? mupirocin ointment (BACTROBAN) 2 % Apply 1 application. topically as needed for rash.  ? NEEDLE, DISP, 22 G (B-D HYPODERMIC NEEDLE 22GX1") 22G X 1" MISC Use as directed with  testosterone.  ? NEEDLE, DISP, 22 G (EASY TOUCH HYPODERMIC NEEDLE) 22G X 1-1/2" MISC Use as directed with testosterone.  ? Omega-3 Fatty Acids (FISH OIL) 1000 MG CAPS Take 1,000 mg by mouth daily.  ? sacubitril-valsartan (ENTRESTO) 97-103 MG Take 1 tablet by mouth 2 (two) times daily.  ? SYRINGE-NEEDLE, DISP, 3 ML (B-D 3CC LUER-LOK SYR 18GX1-1/2) 18G X 1-1/2" 3 ML MISC Use as directed with testosterone.  ? testosterone cypionate (DEPOTESTOSTERONE CYPIONATE) 200 MG/ML injection Inject 0.25ms into the muscle once weekly  ? Testosterone Cypionate 200 MG/ML SOLN Inject 0.8 mLs as directed once a week.  ? triamcinolone ointment (KENALOG) 0.1 % Apply 1 application topically 2 (two) times daily as needed for rash.  ? [DISCONTINUED] spironolactone (ALDACTONE) 25 MG tablet Take 1 tablet (25 mg total) by mouth daily.  ?  ? ?  Allergies:   Lisinopril  ? ?Social History  ? ?Socioeconomic History  ? Marital status: Married  ?  Spouse name: Not on file  ? Number of children: Not on file  ? Years of education: Not on file  ? Highest education level: Not on file  ?Occupational History  ? Not on file  ?Tobacco Use  ? Smoking status: Never  ? Smokeless tobacco: Never  ?Substance and Sexual Activity  ? Alcohol use: No  ? Drug use: No  ? Sexual activity: Not on file  ?Other Topics Concern  ? Not on file  ?Social History Narrative  ? Not on file  ? ?Social Determinants of Health  ? ?Financial Resource Strain: Not on file  ?Food Insecurity: Not on file  ?Transportation Needs: Not on file  ?Physical Activity: Not on file  ?Stress: Not on file  ?Social Connections: Not on file  ?  ? ?Family History: ?The patient's family history is negative for Cancer, Colon cancer, Esophageal cancer, Rectal cancer, and Stomach cancer. ? ?ROS:   ?Please see the history of present illness.    ?All other systems reviewed and are negative. ? ?EKGs/Labs/Other Studies Reviewed:   ? ?The following studies were reviewed today: ?I discussed my findings with the  patient at length. ? ? ?Recent Labs: ?05/07/2021: B Natriuretic Peptide 14.2 ?07/29/2021: ALT 38; BUN 21; Creatinine, Ser 1.78; Hemoglobin 16.0; Platelets 145; Potassium 4.3; Sodium 138  ?Recent Lipid Panel ?   ?Compon

## 2021-09-03 ENCOUNTER — Encounter (HOSPITAL_COMMUNITY): Payer: No Typology Code available for payment source | Admitting: Cardiology

## 2021-09-03 ENCOUNTER — Ambulatory Visit: Payer: No Typology Code available for payment source | Admitting: Family Medicine

## 2021-09-03 ENCOUNTER — Other Ambulatory Visit (HOSPITAL_BASED_OUTPATIENT_CLINIC_OR_DEPARTMENT_OTHER): Payer: Self-pay

## 2021-09-03 MED ORDER — "SYRINGE/NEEDLE (DISP) 18G X 1-1/2"" 3 ML MISC"
1 refills | Status: DC
  Filled 2021-09-03: qty 4, 28d supply, fill #0
  Filled 2021-10-08: qty 4, 28d supply, fill #1

## 2021-09-03 MED ORDER — "NEEDLE (DISP) 22G X 1"" MISC"
1 refills | Status: DC
  Filled 2021-09-03: qty 4, 28d supply, fill #0
  Filled 2021-10-08: qty 4, 28d supply, fill #1

## 2021-09-08 ENCOUNTER — Other Ambulatory Visit (HOSPITAL_BASED_OUTPATIENT_CLINIC_OR_DEPARTMENT_OTHER): Payer: Self-pay

## 2021-09-09 ENCOUNTER — Other Ambulatory Visit (HOSPITAL_BASED_OUTPATIENT_CLINIC_OR_DEPARTMENT_OTHER): Payer: Self-pay

## 2021-09-11 ENCOUNTER — Telehealth: Payer: Self-pay | Admitting: Cardiology

## 2021-09-11 NOTE — Telephone Encounter (Signed)
Report printed and given to Dr. Geraldo Pitter. 8 beats of Vtach on 09/09/21.

## 2021-09-11 NOTE — Telephone Encounter (Signed)
  Michael Guerra - [employer] calling to give critical result 05/23 at 9:31 pm. He said he is uploading it now.  Tried to reach triage and Ladonna Snide, however, Domingo Dimes already hang up when [place] respond.

## 2021-09-12 ENCOUNTER — Other Ambulatory Visit (HOSPITAL_BASED_OUTPATIENT_CLINIC_OR_DEPARTMENT_OTHER): Payer: Self-pay

## 2021-09-22 ENCOUNTER — Other Ambulatory Visit (HOSPITAL_COMMUNITY): Payer: Self-pay

## 2021-09-26 ENCOUNTER — Ambulatory Visit (HOSPITAL_COMMUNITY)
Admission: RE | Admit: 2021-09-26 | Discharge: 2021-09-26 | Disposition: A | Discharge status: Home / Self Care | Payer: No Typology Code available for payment source | Source: Ambulatory Visit | Attending: Cardiology | Admitting: Cardiology

## 2021-09-26 ENCOUNTER — Encounter (HOSPITAL_COMMUNITY): Payer: Self-pay | Admitting: Cardiology

## 2021-09-26 VITALS — BP 110/78 | HR 74 | Wt 203.0 lb

## 2021-09-26 DIAGNOSIS — I130 Hypertensive heart and chronic kidney disease with heart failure and stage 1 through stage 4 chronic kidney disease, or unspecified chronic kidney disease: Secondary | ICD-10-CM | POA: Diagnosis present

## 2021-09-26 DIAGNOSIS — E1122 Type 2 diabetes mellitus with diabetic chronic kidney disease: Secondary | ICD-10-CM | POA: Insufficient documentation

## 2021-09-26 DIAGNOSIS — Z79899 Other long term (current) drug therapy: Secondary | ICD-10-CM | POA: Insufficient documentation

## 2021-09-26 DIAGNOSIS — Z905 Acquired absence of kidney: Secondary | ICD-10-CM | POA: Diagnosis not present

## 2021-09-26 DIAGNOSIS — I5022 Chronic systolic (congestive) heart failure: Secondary | ICD-10-CM | POA: Insufficient documentation

## 2021-09-26 DIAGNOSIS — I2510 Atherosclerotic heart disease of native coronary artery without angina pectoris: Secondary | ICD-10-CM | POA: Insufficient documentation

## 2021-09-26 DIAGNOSIS — R42 Dizziness and giddiness: Secondary | ICD-10-CM | POA: Insufficient documentation

## 2021-09-26 DIAGNOSIS — N1831 Chronic kidney disease, stage 3a: Secondary | ICD-10-CM | POA: Diagnosis not present

## 2021-09-26 NOTE — Patient Instructions (Signed)
Medication Changes:  None, continue current medications  Lab Work:  None  Testing/Procedures:  None  Referrals:  None  Special Instructions // Education:  Do the following things EVERYDAY: Weigh yourself in the morning before breakfast. Write it down and keep it in a log. Take your medicines as prescribed Eat low salt foods--Limit salt (sodium) to 2000 mg per day.  Stay as active as you can everyday Limit all fluids for the day to less than 2 liters   Follow-Up in: 4 months with Dr Shirlee Latch  At [facility], you and your health needs are our priority. We have a designated team specialized in the treatment of Heart Failure. This Care Team includes your primary Heart Failure Specialized Cardiologist (physician), Advanced Practice Providers (APPs- Physician Assistants and Nurse Practitioners), and Pharmacist who all work together to provide you with the care you need, when you need it.   You may see any of the following providers on your designated Care Team at your next follow up:  Dr Arvilla Meres Dr Carron Curie, NP Robbie Lis, [place] [facility] [place], [place] Karle Plumber, PharmD   Please be sure to bring in all your medications bottles to every appointment.   Need to Contact us:  If you have any questions or concerns before your next appointment please send [place] a message through [place] or call our office at [phone].    TO LEAVE A MESSAGE FOR THE NURSE SELECT OPTION 2, PLEASE LEAVE A MESSAGE INCLUDING: YOUR NAME DATE OF BIRTH CALL BACK NUMBER REASON FOR CALL**this is important as we prioritize the call backs  YOU WILL RECEIVE A CALL BACK THE SAME DAY AS LONG AS YOU CALL BEFORE 4:00 PM

## 2021-09-26 NOTE — Progress Notes (Signed)
ReDS Vest / Clip - 09/26/21 0900       ReDS Vest / Clip   Station Marker D    Ruler Value 32    ReDS Value Range Low volume    ReDS Actual Value 33

## 2021-09-26 NOTE — Progress Notes (Signed)
PCP: Shelda Pal, DO Cardiology: Dr. Geraldo Pitter HF Cardiology: Dr. Aundra Dubin  61 y.o. with history of CKD stage 3, HTN, and diabetes was referred for evaluation of CHF by Dr. Geraldo Pitter.  Patient has had diabetes and HTN for about 10-15 years per his report.  He has CKD stage 3 in setting of nephrectomy done in childhood due to trauma, follows with nephrology.  He developed atypical chest pain in 2/21 and had Cardiolite done.  This showed EF 23% with fixed inferior defect.  Echo showed EF 35-40%, mild LV dilation, no LVH, normal RV.  He then had LHC done, showing OM1 65% stenosis, distal LAD 70% stenosis.  Cardiomyopathy was thought to be out of proportion to CAD. No recent viral-type infection symptoms.  No strong family history of CHF.    Cardiac MRI in 12/21 was a difficult study, EF 15% with diffuse hypokinesis, mildly decreased RV systolic function, no LGE.    Echo in 3/22 showed EF 30-35%, diffuse hypokinesis, normal RV, normal IVC.    Echo 8/22, EF 30-35% with mildly decreased RV function, normal IVC.   Last visit w/ Dr. Aundra Dubin 1/23, Delene Loll was increased to 97-103 and he was referred to EP for consideration for ICD given persistently low EF. He was seen by Dr. Caryl Comes, who recommended repeat Echo. This was completed 3/22 and showed improved EF up to 45-50%, RV normal. EF out of range for ICD.   Seen recently by Dr. Geraldo Pitter 5/23. Endorsed positional dizziness. Arlyce Harman was reduced to 12.5 mg and monitor placed to assess for arrhythmias. He is currently wearing event monitor.   Presents to clinic today for f/u. Reports doing fairly well. 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  NYHA Class I-II. Denies CP. C/w possible dizziness but improved some w/ spiro dose reduction. Takes time getting up from seated position. Denies syncope/ near syncope. No palpitations. Currently wearing heart monitor. Volume status stable. ReDs Clip 33%.    Labs (6/21): LDL 60,  K 4.6, creatinine 1.5 Labs (1/22): K 4.5, creatinine 1.46 Labs (3/22): K 4.7, creatinine 1.4 Labs (5/22): K 4.1, creatinine 1.45, LDL 33 Labs (11/22): LDL 51 Labs (12/22): K 4.4, creatinine 1.41 Labs (5/23): K 4.7, creatinine 1.54   PMH: 1. CKD stage 3: H/o nephrectomy in childhood post-trauma.  2. HTN - Renal artery dopplers (9/21): No significant stenosis.  3. Hyperlipidemia 4. Type 2 diabetes 5. CAD: LHC (2/21) with OM1 65% stenosis, distal LAD 70% stenosis.  6. Chronic systolic CHF: Nonischemic cardiomyopathy, cardiomyopathy out of proportion to CAD.  - Echo (2/21) with EF 35-40%, mild LV dilation, no LVH, normal RV.  - Cardiac MRI (12/21): Difficult study, EF 15% with diffuse hypokinesis, mildly decreased RV systolic function, no LGE.   - Echo (3/22): EF 30-35%, diffuse hypokinesis, normal RV, normal IVC.  - Echo (8/22): EF 30-35% with mildly decreased RV function, normal IVC.   SH: Works as a Presenter, broadcasting at [employer], married, 1 child, no ETOH or smoking.   FH: Mother with diabetes, possible CHF.   ROS: All systems reviewed and negative except as per HPI.   Current Outpatient Medications  Medication Sig Dispense Refill   acetaminophen (TYLENOL) 500 MG tablet Take 1,000 mg by mouth every 6 (six) hours as needed for moderate pain or headache.     aspirin 81 MG tablet Take 81 mg by mouth daily.     atorvastatin (LIPITOR) 40 MG tablet Take 1 tablet (40  mg total) by mouth daily. 90 tablet 3   Blood Glucose Monitoring Suppl (FREESTYLE LITE) w/Device KIT Use daily to check blood sugar 1 kit 0   carvedilol (COREG) 25 MG tablet Take 1 tablet (25 mg total) by mouth 2 (two) times daily with a meal. 180 tablet 3   dapagliflozin propanediol (FARXIGA) 10 MG TABS tablet Take 1 tablet (10 mg total) by mouth daily. 90 tablet 2   glucose blood (FREESTYLE LITE) test strip Use daily to check blood sugar 100 each 3   Lactobacillus (PROBIOTIC ACIDOPHILUS PO) Take 1 tablet by mouth daily in  the afternoon.     Lancets (FREESTYLE) lancets Use to check blood sugars daily 100 each 3   Melatonin 10 MG CAPS Take 30 mg by mouth at bedtime as needed for sleep (sleep).     metFORMIN (GLUCOPHAGE-XR) 500 MG 24 hr tablet Take 2 tablets (1,000 mg total) by mouth in the morning and at bedtime. 180 tablet 2   Multiple Vitamins-Minerals (MULTIVITAMIN ADULT) TABS Take 1 tablet by mouth daily with lunch.      mupirocin ointment (BACTROBAN) 2 % Apply 1 application. topically as needed for rash.     NEEDLE, DISP, 22 G (B-D HYPODERMIC NEEDLE 22GX1") 22G X 1" MISC Use as directed with testosterone. 4 each 1   NEEDLE, DISP, 22 G (EASY TOUCH HYPODERMIC NEEDLE) 22G X 1-1/2" MISC Use as directed with testosterone. 4 each 1   Omega-3 Fatty Acids (FISH OIL) 1000 MG CAPS Take 1,000 mg by mouth daily.     sacubitril-valsartan (ENTRESTO) 97-103 MG Take 1 tablet by mouth 2 (two) times daily. 60 tablet 6   spironolactone (ALDACTONE) 25 MG tablet Take 1/2 tablet (12.5 mg total) by mouth daily. 30 tablet 3   SYRINGE-NEEDLE, DISP, 3 ML (B-D 3CC LUER-LOK SYR 18GX1-1/2) 18G X 1-1/2" 3 ML MISC Use as directed with testosterone. 4 each 1   testosterone cypionate (DEPOTESTOSTERONE CYPIONATE) 200 MG/ML injection Inject 0.26ms into the muscle once weekly 10 mL 1   triamcinolone ointment (KENALOG) 0.1 % Apply 1 application topically 2 (two) times daily as needed for rash.     No current facility-administered medications for this encounter.   BP 110/78   Pulse 74   Wt 92.1 kg (203 lb)   SpO2 96%   BMI 26.78 kg/m  PHYSICAL EXAM: ReDS 33%  General:  Well appearing. No respiratory difficulty HEENT: normal Neck: supple. no JVD. Carotids 2+ bilat; no bruits. No lymphadenopathy or thyromegaly appreciated. Cor: PMI nondisplaced. Regular rate & rhythm. No rubs, gallops or murmurs. Lungs: clear Abdomen: soft, nontender, nondistended. No hepatosplenomegaly. No bruits or masses. Good bowel sounds. Extremities: no cyanosis,  clubbing, rash, edema Neuro: alert & oriented x 3, cranial nerves grossly intact. moves all 4 extremities w/o difficulty. Affect pleasant.   Assessment/Plan: 1. Chronic systolic CHF: Echo in 23/20with EF 35-40%.  LHC in 2/21 with moderate coronary disease, but coronary disease appears out of proportion to the severity of the cardiomyopathy.  It is possible that long-standing HTN and DM have triggered the cardiomyopathy.  Cardiac MRI in 12/21 was a difficult study, showed EF 15% with diffuse hypokinesis, mildly decreased RV systolic function, no LGE.  No evidence for infiltrative disease or myocarditis. Echo in 8/22 stable EF 30-35%, mildly decreased RV systolic function. Echo 3/23 EF improved  45-50%, RV normal.  - NYHA Class I-II. Euvolemic on exam and by ReDS, 33%. - Continue Entresto to 97/103 bid.   - Continue Coreg  25 mg bid.  - Continue spironolactone 12.5 mg daily (on low dose due to positional dizziness).  - Continue Farxiga 10 mg daily.  - EF now out of range for ICD  2. CAD: Moderate disease, no intervention on 2/21 cath. Stable w/o CP  - Continue ASA 81 daily.  - Continue atorvastatin. PCP recently checked lipids, LDL well controlled 38 mg/dL .  3. HTN: BP controlled on current regimen  4. CKD stage 3a: Solitary kidney.  - BMP recentl obtained at PCP office. Results revived, stable SCr/K  - Continue [place].  5. Dizziness: appears positional. Improved w/ recent spiro decrease. Recent echo w/ improved EF and no significant valvular abnormalties. Volume status ok, ReDs 33%. Doubt secondary to arrthymias but wearing monitor as part of w/u per Cardiology. Dr. Geraldo Pitter to follow results.  - advised to change positions slowly, encouraged TED hoses   Followup w/ Dr. Aundra Dubin in 3- 4 months.   Lyda Jester, PA-C  09/26/2021

## 2021-09-29 ENCOUNTER — Other Ambulatory Visit (HOSPITAL_BASED_OUTPATIENT_CLINIC_OR_DEPARTMENT_OTHER): Payer: Self-pay

## 2021-10-08 ENCOUNTER — Other Ambulatory Visit (HOSPITAL_BASED_OUTPATIENT_CLINIC_OR_DEPARTMENT_OTHER): Payer: Self-pay

## 2021-10-24 ENCOUNTER — Other Ambulatory Visit (HOSPITAL_BASED_OUTPATIENT_CLINIC_OR_DEPARTMENT_OTHER): Payer: Self-pay

## 2021-10-28 ENCOUNTER — Other Ambulatory Visit (HOSPITAL_BASED_OUTPATIENT_CLINIC_OR_DEPARTMENT_OTHER): Payer: Self-pay

## 2021-10-28 MED ORDER — TESTOSTERONE CYPIONATE 200 MG/ML IM SOLN
INTRAMUSCULAR | 1 refills | Status: DC
  Filled 2021-10-28: qty 2, 28d supply, fill #0
  Filled 2021-12-09: qty 2, 28d supply, fill #1
  Filled 2022-01-07: qty 2, 28d supply, fill #2
  Filled 2022-02-25: qty 2, 28d supply, fill #3
  Filled 2022-03-27: qty 2, 28d supply, fill #4
  Filled 2022-04-15 – 2022-04-23 (×2): qty 2, 28d supply, fill #5

## 2021-10-29 ENCOUNTER — Other Ambulatory Visit (HOSPITAL_BASED_OUTPATIENT_CLINIC_OR_DEPARTMENT_OTHER): Payer: Self-pay

## 2021-10-29 MED ORDER — "NEEDLE (DISP) 22G X 1"" MISC"
1 refills | Status: DC
  Filled 2021-10-29: qty 4, 28d supply, fill #0
  Filled 2021-12-09: qty 4, 28d supply, fill #1

## 2021-10-29 MED ORDER — "SYRINGE/NEEDLE (DISP) 18G X 1-1/2"" 3 ML MISC"
1 refills | Status: DC
  Filled 2021-10-29: qty 4, 28d supply, fill #0
  Filled 2021-12-09: qty 4, 28d supply, fill #1

## 2021-11-20 ENCOUNTER — Other Ambulatory Visit (HOSPITAL_BASED_OUTPATIENT_CLINIC_OR_DEPARTMENT_OTHER): Payer: Self-pay

## 2021-11-27 ENCOUNTER — Other Ambulatory Visit (HOSPITAL_BASED_OUTPATIENT_CLINIC_OR_DEPARTMENT_OTHER): Payer: Self-pay

## 2021-11-28 ENCOUNTER — Other Ambulatory Visit (HOSPITAL_BASED_OUTPATIENT_CLINIC_OR_DEPARTMENT_OTHER): Payer: Self-pay

## 2021-12-09 ENCOUNTER — Other Ambulatory Visit (HOSPITAL_BASED_OUTPATIENT_CLINIC_OR_DEPARTMENT_OTHER): Payer: Self-pay

## 2021-12-12 ENCOUNTER — Other Ambulatory Visit (HOSPITAL_COMMUNITY): Payer: Self-pay

## 2021-12-15 ENCOUNTER — Other Ambulatory Visit (HOSPITAL_COMMUNITY): Payer: Self-pay

## 2021-12-15 ENCOUNTER — Other Ambulatory Visit (HOSPITAL_BASED_OUTPATIENT_CLINIC_OR_DEPARTMENT_OTHER): Payer: Self-pay

## 2021-12-24 ENCOUNTER — Other Ambulatory Visit (HOSPITAL_BASED_OUTPATIENT_CLINIC_OR_DEPARTMENT_OTHER): Payer: Self-pay

## 2022-01-07 ENCOUNTER — Other Ambulatory Visit (HOSPITAL_BASED_OUTPATIENT_CLINIC_OR_DEPARTMENT_OTHER): Payer: Self-pay

## 2022-01-07 MED ORDER — "NEEDLE (DISP) 22G X 1"" MISC"
1 refills | Status: DC
  Filled 2022-01-07: qty 4, 28d supply, fill #0
  Filled 2022-02-12: qty 4, 28d supply, fill #1

## 2022-01-07 MED ORDER — "SYRINGE/NEEDLE (DISP) 18G X 1-1/2"" 3 ML MISC"
1 refills | Status: DC
  Filled 2022-01-07: qty 4, 28d supply, fill #0
  Filled 2022-02-12: qty 4, 28d supply, fill #1

## 2022-01-28 ENCOUNTER — Other Ambulatory Visit (HOSPITAL_COMMUNITY): Payer: Self-pay | Admitting: Cardiology

## 2022-01-28 ENCOUNTER — Other Ambulatory Visit (HOSPITAL_BASED_OUTPATIENT_CLINIC_OR_DEPARTMENT_OTHER): Payer: Self-pay

## 2022-01-28 MED ORDER — ENTRESTO 97-103 MG PO TABS
1.0000 | ORAL_TABLET | Freq: Two times a day (BID) | ORAL | 11 refills | Status: DC
  Filled 2022-01-28: qty 60, 30d supply, fill #0
  Filled 2022-03-02: qty 60, 30d supply, fill #1
  Filled 2022-04-23: qty 60, 30d supply, fill #2
  Filled 2022-05-26: qty 60, 30d supply, fill #3
  Filled 2022-07-07: qty 60, 30d supply, fill #4
  Filled 2022-08-14: qty 60, 30d supply, fill #5
  Filled 2022-09-24 (×3): qty 60, 30d supply, fill #6
  Filled 2022-11-03: qty 60, 30d supply, fill #7
  Filled 2022-12-08 – 2022-12-09 (×2): qty 60, 30d supply, fill #8
  Filled 2023-01-04: qty 60, 30d supply, fill #9

## 2022-02-12 ENCOUNTER — Other Ambulatory Visit (HOSPITAL_BASED_OUTPATIENT_CLINIC_OR_DEPARTMENT_OTHER): Payer: Self-pay

## 2022-02-26 ENCOUNTER — Other Ambulatory Visit (HOSPITAL_BASED_OUTPATIENT_CLINIC_OR_DEPARTMENT_OTHER): Payer: Self-pay

## 2022-02-27 ENCOUNTER — Other Ambulatory Visit (HOSPITAL_BASED_OUTPATIENT_CLINIC_OR_DEPARTMENT_OTHER): Payer: Self-pay

## 2022-03-02 ENCOUNTER — Other Ambulatory Visit (HOSPITAL_BASED_OUTPATIENT_CLINIC_OR_DEPARTMENT_OTHER): Payer: Self-pay

## 2022-03-11 ENCOUNTER — Other Ambulatory Visit (HOSPITAL_BASED_OUTPATIENT_CLINIC_OR_DEPARTMENT_OTHER): Payer: Self-pay

## 2022-03-11 ENCOUNTER — Ambulatory Visit (INDEPENDENT_AMBULATORY_CARE_PROVIDER_SITE_OTHER): Payer: No Typology Code available for payment source | Admitting: Family Medicine

## 2022-03-11 ENCOUNTER — Encounter: Payer: Self-pay | Admitting: Family Medicine

## 2022-03-11 VITALS — BP 126/84 | HR 77 | Temp 98.1°F | Resp 18 | Ht 73.0 in | Wt 206.8 lb

## 2022-03-11 DIAGNOSIS — Z125 Encounter for screening for malignant neoplasm of prostate: Secondary | ICD-10-CM | POA: Diagnosis not present

## 2022-03-11 DIAGNOSIS — E1165 Type 2 diabetes mellitus with hyperglycemia: Secondary | ICD-10-CM

## 2022-03-11 DIAGNOSIS — Z0000 Encounter for general adult medical examination without abnormal findings: Secondary | ICD-10-CM | POA: Diagnosis not present

## 2022-03-11 MED ORDER — TIRZEPATIDE 2.5 MG/0.5ML ~~LOC~~ SOAJ
2.5000 mg | SUBCUTANEOUS | 0 refills | Status: DC
  Filled 2022-03-11: qty 2, 28d supply, fill #0

## 2022-03-11 MED ORDER — TIRZEPATIDE 5 MG/0.5ML ~~LOC~~ SOAJ
5.0000 mg | SUBCUTANEOUS | 0 refills | Status: AC
  Filled 2022-03-11: qty 2, 28d supply, fill #0

## 2022-03-11 MED ORDER — TIRZEPATIDE 10 MG/0.5ML ~~LOC~~ SOAJ
10.0000 mg | SUBCUTANEOUS | 0 refills | Status: DC
  Filled 2022-03-11 – 2022-06-09 (×2): qty 2, 28d supply, fill #0

## 2022-03-11 MED ORDER — TIRZEPATIDE 7.5 MG/0.5ML ~~LOC~~ SOAJ
7.5000 mg | SUBCUTANEOUS | 0 refills | Status: DC
  Filled 2022-03-11 – 2022-05-12 (×2): qty 2, 28d supply, fill #0

## 2022-03-11 NOTE — Progress Notes (Signed)
Chief Complaint  Patient presents with   Annual Exam    Pt states not fasting     Well Male NORMAL RECINOS is here for a complete physical.   His last physical was >1 year ago.  Current diet: in general, diet could be better.  Current exercise: active at work Weight trend: increased a little Fatigue out of ordinary? No. Seat belt? Yes.   Advanced directive? No  Health maintenance Shingrix- No Colonoscopy- Yes Tetanus- Yes HIV- Yes Hep C- Yes  DM2- taking Metformin XR 1000 mg bid and Farxiga 10 mg/d. Diet has not been ideal he is worried that his poor eating, which is set to get worse with the holidays approaching, will worsen his sugars. He does not monitor them routinely. No signs of hypoglycemia. Compliant w meds no AE's. Has gained a few lbs.    Past Medical History:  Diagnosis Date   CAD (coronary artery disease) 06/08/2019   Cardiomyopathy ([place]) 05/23/2019   Cataract    Chest discomfort 05/12/2019   CHF (congestive heart failure) ([place])    Chronic kidney disease    one kidney that is healthy   Chronic left shoulder pain 02/05/2020   Diabetes mellitus due to underlying condition with unspecified complications ([place]) 10/19/6376   Essential hypertension 05/12/2019   Foot sprain, left, initial encounter 06/22/2019   Hyperlipidemia 02/17/2006   Qualifier: Diagnosis of  By: Annamaria Boots MD, Ysidro Evert     LIVER FUNCTION TESTS, ABNORMAL 05/21/2006   Qualifier: Diagnosis of  By: Annamaria Boots MD, Ysidro Evert     Mixed dyslipidemia 05/12/2019   Other specified abnormal findings of blood chemistry 05/20/2011   Formatting of this note might be different from the original. Elevated liver function tests 10/1 IMO update   Rectal polyp 05/20/2011   Formatting of this note might be different from the original. Noted march 2011 fu in march 2016   Renal insufficiency 05/23/2019   Skin lesion of chest wall 05/20/2011   Formatting of this note might be different from the original. Mid chest (prob seb/epidermoid cyst)    Solitary kidney    Type 2 diabetes mellitus with hyperglycemia, without long-term current use of insulin ([place]) 02/17/2006   Qualifier: Diagnosis of  By: Annamaria Boots MD, Ysidro Evert        Past Surgical History:  Procedure Laterality Date   ABDOMINAL SURGERY     KIDNEY SURGERY     bowel obstruction  x 3   LEFT HEART CATH AND CORONARY ANGIOGRAPHY N/A 05/31/2019   Procedure: LEFT HEART CATH AND CORONARY ANGIOGRAPHY;  Surgeon: Wellington Hampshire, MD;  Location: [facility];  Service: Cardiovascular;  Laterality: N/A;    Medications  Current Outpatient Medications on File Prior to Visit  Medication Sig Dispense Refill   acetaminophen (TYLENOL) 500 MG tablet Take 1,000 mg by mouth every 6 (six) hours as needed for moderate pain or headache.     aspirin 81 MG tablet Take 81 mg by mouth daily.     atorvastatin (LIPITOR) 40 MG tablet Take 1 tablet (40 mg total) by mouth daily. 90 tablet 3   Blood Glucose Monitoring Suppl (FREESTYLE LITE) w/Device KIT Use daily to check blood sugar 1 kit 0   carvedilol (COREG) 25 MG tablet Take 1 tablet (25 mg total) by mouth 2 (two) times daily with a meal. 180 tablet 3   dapagliflozin propanediol (FARXIGA) 10 MG TABS tablet Take 1 tablet (10 mg total) by mouth daily. 90 tablet 2   glucose blood (  FREESTYLE LITE) test strip Use daily to check blood sugar 100 each 3   Lactobacillus (PROBIOTIC ACIDOPHILUS PO) Take 1 tablet by mouth daily in the afternoon.     Lancets (FREESTYLE) lancets Use to check blood sugars daily 100 each 3   Melatonin 10 MG CAPS Take 30 mg by mouth at bedtime as needed for sleep (sleep).     metFORMIN (GLUCOPHAGE-XR) 500 MG 24 hr tablet Take 2 tablets (1,000 mg total) by mouth in the morning and at bedtime. 180 tablet 2   Multiple Vitamins-Minerals (MULTIVITAMIN ADULT) TABS Take 1 tablet by mouth daily with lunch.      mupirocin ointment (BACTROBAN) 2 % Apply 1 application. topically as needed for rash.     NEEDLE, DISP, 22 G (EASY TOUCH  HYPODERMIC NEEDLE) 22G X 1" MISC Use as directed with testosterone. 4 each 1   NEEDLE, DISP, 22 G (EASY TOUCH HYPODERMIC NEEDLE) 22G X 1-1/2" MISC Use as directed with testosterone. 4 each 1   Omega-3 Fatty Acids (FISH OIL) 1000 MG CAPS Take 1,000 mg by mouth daily.     sacubitril-valsartan (ENTRESTO) 97-103 MG Take 1 tablet by mouth 2 (two) times daily. 60 tablet 11   spironolactone (ALDACTONE) 25 MG tablet Take 1/2 tablet (12.5 mg total) by mouth daily. 30 tablet 3   SYRINGE-NEEDLE, DISP, 3 ML (B-D 3CC LUER-LOK SYR 18GX1-1/2) 18G X 1-1/2" 3 ML MISC Use as directed with testosterone. 4 each 1   testosterone cypionate (DEPOTESTOSTERONE CYPIONATE) 200 MG/ML injection Inject 0.70ms into the muscle once weekly 10 mL 1   testosterone cypionate (DEPOTESTOSTERONE CYPIONATE) 200 MG/ML injection Inject 0.554m into the muscle once weekly 10 mL 1   triamcinolone ointment (KENALOG) 0.1 % Apply 1 application topically 2 (two) times daily as needed for rash.     Allergies Allergies  Allergen Reactions   Lisinopril Cough    Family History Family History  Problem Relation Age of Onset   Cancer Neg Hx    Colon cancer Neg Hx    Esophageal cancer Neg Hx    Rectal cancer Neg Hx    Stomach cancer Neg Hx     Review of Systems: Constitutional:  no fevers Eye:  no recent significant change in vision Ear/Nose/Mouth/Throat:  Ears:  no hearing loss Nose/Mouth/Throat:  no complaints of nasal congestion, no sore throat Cardiovascular:  no chest pain Respiratory:  no shortness of breath Gastrointestinal:  no change in bowel habits GU:  Male: negative for dysuria, frequency Musculoskeletal/Extremities:  no joint pain Integumentary (Skin/Breast):  no abnormal skin lesions reported Neurologic:  no headaches Endocrine: No unexpected weight changes Hematologic/Lymphatic:  no abnormal bleeding  Exam BP 126/84 (BP Location: Left Arm, Patient Position: Sitting, Cuff Size: Normal)   Pulse 77   Temp 98.1 F  (36.7 C) (Oral)   Resp 18   Ht 6' 1" (1.854 m)   Wt 206 lb 12.8 oz (93.8 kg)   SpO2 97%   BMI 27.28 kg/m  General:  well developed, well nourished, in no apparent distress Skin:  no significant moles, warts, or growths Head:  no masses, lesions, or tenderness Eyes:  pupils equal and round, sclera anicteric without injection Ears:  canals without lesions, TMs shiny without retraction, no obvious effusion, no erythema Nose:  nares patent, mucosa normal Throat/Pharynx:  lips and gingiva without lesion; tongue and uvula midline; non-inflamed pharynx; no exudates or postnasal drainage Neck: neck supple without adenopathy, thyromegaly, or masses Cardiac: RRR, no bruits, no LE edema Lungs:  clear to auscultation, breath sounds equal bilaterally, no respiratory distress Abdomen: BS+, soft, non-tender, non-distended, no masses or organomegaly noted Rectal: Deferred Musculoskeletal:  symmetrical muscle groups noted without atrophy or deformity Neuro:  gait normal; deep tendon reflexes normal and symmetric Psych: well oriented with normal range of affect and appropriate judgment/insight  Assessment and Plan  Well adult exam - Plan: CBC, Comprehensive metabolic panel, Lipid panel  Type 2 diabetes mellitus with hyperglycemia, without long-term current use of insulin (HCC) - Plan: Hemoglobin A1c, Microalbumin / creatinine urine ratio, tirzepatide (MOUNJARO) 2.5 MG/0.5ML Pen, tirzepatide (MOUNJARO) 5 MG/0.5ML Pen, tirzepatide (MOUNJARO) 7.5 MG/0.5ML Pen, tirzepatide (MOUNJARO) 10 MG/0.5ML Pen  Screening for prostate cancer - Plan: PSA   Well 61 y.o. male. Counseled on risks and benefits of prostate cancer screening with PSA. The patient agrees to undergo testing. DM: Chronic, probably uncontrolled.  Continue metformin XR 1000 mg twice daily, Farxiga 10 mg daily.  Will start Mounjaro 2.5 mg weekly for 4 weeks and then increase by 2.5 mg every 4 weeks until his follow-up in 90 days.  Counseled on  diet and exercise. Advanced directive form provided today.  Immunizations, labs, and further orders as above. The patient voiced understanding and agreement to the plan.  [place], DO 03/11/22 3:43 PM

## 2022-03-11 NOTE — Patient Instructions (Addendum)
Give [place] 2-3 business days to get the results of your labs back.   Keep the diet clean and stay active.  Please get me a copy of your advanced directive form at your convenience.   Let us know if you need anything.  Semimembranosus Tendinitis Rehab  It is normal to feel mild stretching, pulling, tightness, or discomfort as you do these exercises, but you should stop right away if you feel sudden pain or your pain gets worse.  Stretching and range of motion exercises These exercises warm up your muscles and joints and improve the movement and flexibility of your thigh. These exercises also help to relieve pain, numbness, and tingling. Exercise A: Hamstring stretch, supine    Lie on your back. Loop a belt or towel across the ball of your left / right foot The ball of your foot is on the walking surface, right under your toes. Straighten your left / right knee and slowly pull on the belt to raise your leg. Stop when you feel a gentle stretch behind your left / right knee or thigh. Do not allow the knee to bend. Keep your other leg flat on the floor. Hold this position for 30 seconds. Repeat 2 times. Complete this exercise 3 times a week. Strengthening exercises These exercises build strength and endurance in your thigh. Endurance is the ability to use your muscles for a long time, even after they get tired. Exercise B: Straight leg raises (hip extensors) Lie on your belly on a bed or a firm surface with a pillow under your hips. Bend your left / right knee so your foot is straight up in the air. Squeeze your buttock muscles and lift your left / right thigh off the bed. Do not let your back arch. Hold this position for 3 seconds. Slowly return to the starting position. Let your muscles relax completely before you do another repetition. Repeat 2 times. Complete this exercise 3 times a week. Exercise C: Bridge (hip extensors)     Lie on your back on a firm surface with your knees bent and  your feet flat on the floor. Tighten your buttocks muscles and lift your bottom off the floor until your trunk is level with your thighs. You should feel the muscles working in your buttocks and the back of your thighs. If you do not feel these muscles, slide your feet 1-2 inches (2.5-5 cm) farther away from your buttocks. Do not arch your back. Hold this position for 3 seconds. Slowly lower your hips to the starting position. Let your buttocks muscles relax completely between repetitions. If this exercise is too easy, try doing it with your arms crossed over your chest. Repeat 2 times. Complete this exercise 3 times a week. Exercise D: Hamstring eccentric, prone Lie on your belly on a bed or on the floor. Start with your legs straight. Cross your legs at the ankles with your left / right leg on top. Using your bottom leg to do the work, bend both knees. Using just your left / right leg alone, slowly lower your leg back down toward the bed. Add a 5 lb weight as told by your health care provider. Let your muscles relax completely between repetitions. Repeat 2 times. Complete this exercise 3 times a week. Exercise E: Squats Stand in front of a table, with your feet and knees pointing straight ahead. You may rest your hands on the table for balance but not for support. Slowly bend your knees and  lower your hips like you are going to sit in a chair. Keep your thighs straight or pointed slightly outward. Keep your weight over your heels, not over your toes. Keep your lower legs upright so they are parallel with the table legs. Do not let your hips go lower than your knees. Stop when your knees are bent to the shape of an upside-down letter "L" (90 degree angle). Do not bend lower than told by your health care provider. If your knee pain increases, do not bend as low. Hold the squat position 1-2 seconds. Slowly push with your legs to return to standing. Do not use your hands to pull yourself to  standing. Repeat 2 times. Complete this exercise 3 times a week. Make sure you discuss any questions you have with your health care provider. Document Released: 04/06/2005 Document Revised: 12/12/2015 Document Reviewed: 01/08/2015 Elsevier Interactive Patient Education  [employer].

## 2022-03-12 LAB — LIPID PANEL
Cholesterol: 102 mg/dL (ref <200)
HDL: 38 mg/dL — ABNORMAL LOW (ref >=40)
LDL Cholesterol (Calc): 38 mg/dL (calc)
Non-HDL Cholesterol (Calc): 64 mg/dL (calc) (ref <130)
Total CHOL/HDL Ratio: 2.7 (calc) (ref <5.0)
Triglycerides: 179 mg/dL — ABNORMAL HIGH (ref <150)

## 2022-03-12 LAB — COMPREHENSIVE METABOLIC PANEL
AG Ratio: 1.6 (calc) (ref 1.0–2.5)
ALT: 28 U/L (ref 9–46)
AST: 37 U/L — ABNORMAL HIGH (ref 10–35)
Albumin: 4.3 g/dL (ref 3.6–5.1)
Alkaline phosphatase (APISO): 66 U/L (ref 35–144)
BUN/Creatinine Ratio: 13 (calc) (ref 6–22)
BUN: 20 mg/dL (ref 7–25)
CO2: 28 mmol/L (ref 20–32)
Calcium: 9.6 mg/dL (ref 8.6–10.3)
Chloride: 101 mmol/L (ref 98–110)
Creat: 1.59 mg/dL — ABNORMAL HIGH (ref 0.70–1.35)
Globulin: 2.7 g/dL (calc) (ref 1.9–3.7)
Glucose, Bld: 85 mg/dL (ref 65–99)
Potassium: 4.4 mmol/L (ref 3.5–5.3)
Sodium: 139 mmol/L (ref 135–146)
Total Bilirubin: 2.2 mg/dL — ABNORMAL HIGH (ref 0.2–1.2)
Total Protein: 7 g/dL (ref 6.1–8.1)

## 2022-03-12 LAB — CBC
HCT: 45.2 % (ref 38.5–50.0)
Hemoglobin: 15.4 g/dL (ref 13.2–17.1)
MCH: 30.6 pg (ref 27.0–33.0)
MCHC: 34.1 g/dL (ref 32.0–36.0)
MCV: 89.7 fL (ref 80.0–100.0)
MPV: 10.8 fL (ref 7.5–12.5)
Platelets: 170 10*3/uL (ref 140–400)
RBC: 5.04 10*6/uL (ref 4.20–5.80)
RDW: 13.4 % (ref 11.0–15.0)
WBC: 3.5 10*3/uL — ABNORMAL LOW (ref 3.8–10.8)

## 2022-03-12 LAB — MICROALBUMIN / CREATININE URINE RATIO
Creatinine, Urine: 83 mg/dL (ref 20–320)
Microalb Creat Ratio: 5 mcg/mg creat (ref <30)
Microalb, Ur: 0.4 mg/dL

## 2022-03-12 LAB — HEMOGLOBIN A1C
Hgb A1c MFr Bld: 7.5 % of total Hgb — ABNORMAL HIGH (ref <5.7)
Mean Plasma Glucose: 169 mg/dL
eAG (mmol/L): 9.3 mmol/L

## 2022-03-12 LAB — PSA: PSA: 0.47 ng/mL (ref <=4.00)

## 2022-03-17 ENCOUNTER — Other Ambulatory Visit (HOSPITAL_BASED_OUTPATIENT_CLINIC_OR_DEPARTMENT_OTHER): Payer: Self-pay

## 2022-03-18 ENCOUNTER — Other Ambulatory Visit (HOSPITAL_COMMUNITY): Payer: Self-pay

## 2022-03-19 ENCOUNTER — Other Ambulatory Visit (HOSPITAL_COMMUNITY): Payer: Self-pay

## 2022-03-19 MED ORDER — "NEEDLE (DISP) 23G X 1"" MISC"
1 refills | Status: DC
  Filled 2022-03-19 – 2022-05-12 (×2): qty 4, 28d supply, fill #0
  Filled 2022-06-09: qty 4, 28d supply, fill #1

## 2022-03-20 ENCOUNTER — Other Ambulatory Visit (HOSPITAL_COMMUNITY): Payer: Self-pay

## 2022-03-27 ENCOUNTER — Other Ambulatory Visit (HOSPITAL_BASED_OUTPATIENT_CLINIC_OR_DEPARTMENT_OTHER): Payer: Self-pay

## 2022-03-27 ENCOUNTER — Other Ambulatory Visit (HOSPITAL_COMMUNITY): Payer: Self-pay

## 2022-03-27 ENCOUNTER — Other Ambulatory Visit: Payer: Self-pay

## 2022-03-27 ENCOUNTER — Other Ambulatory Visit: Payer: Self-pay | Admitting: Family Medicine

## 2022-03-27 MED ORDER — DAPAGLIFLOZIN PROPANEDIOL 10 MG PO TABS
10.0000 mg | ORAL_TABLET | Freq: Every day | ORAL | 0 refills | Status: DC
  Filled 2022-03-27: qty 90, 90d supply, fill #0

## 2022-04-10 ENCOUNTER — Other Ambulatory Visit (HOSPITAL_BASED_OUTPATIENT_CLINIC_OR_DEPARTMENT_OTHER): Payer: Self-pay

## 2022-04-10 ENCOUNTER — Other Ambulatory Visit (HOSPITAL_COMMUNITY): Payer: Self-pay | Admitting: Cardiology

## 2022-04-10 MED ORDER — CARVEDILOL 25 MG PO TABS
25.0000 mg | ORAL_TABLET | Freq: Two times a day (BID) | ORAL | 0 refills | Status: DC
  Filled 2022-04-10: qty 180, 90d supply, fill #0

## 2022-04-15 ENCOUNTER — Other Ambulatory Visit: Payer: Self-pay | Admitting: Family Medicine

## 2022-04-15 ENCOUNTER — Other Ambulatory Visit: Payer: Self-pay

## 2022-04-15 ENCOUNTER — Other Ambulatory Visit (HOSPITAL_BASED_OUTPATIENT_CLINIC_OR_DEPARTMENT_OTHER): Payer: Self-pay

## 2022-04-15 DIAGNOSIS — E1165 Type 2 diabetes mellitus with hyperglycemia: Secondary | ICD-10-CM

## 2022-04-15 MED ORDER — MOUNJARO 2.5 MG/0.5ML ~~LOC~~ SOAJ
2.5000 mg | SUBCUTANEOUS | 0 refills | Status: AC
  Filled 2022-04-15: qty 2, 28d supply, fill #0

## 2022-04-15 MED ORDER — "SYRINGE/NEEDLE (DISP) 18G X 1-1/2"" 3 ML MISC"
1 refills | Status: DC
  Filled 2022-04-15: qty 4, 28d supply, fill #0
  Filled 2022-05-12: qty 4, 28d supply, fill #1

## 2022-04-23 ENCOUNTER — Other Ambulatory Visit (HOSPITAL_BASED_OUTPATIENT_CLINIC_OR_DEPARTMENT_OTHER): Payer: Self-pay

## 2022-04-23 ENCOUNTER — Other Ambulatory Visit: Payer: Self-pay

## 2022-05-05 ENCOUNTER — Other Ambulatory Visit (HOSPITAL_COMMUNITY): Payer: Self-pay

## 2022-05-05 ENCOUNTER — Other Ambulatory Visit: Payer: Self-pay | Admitting: Family Medicine

## 2022-05-05 MED ORDER — DAPAGLIFLOZIN PROPANEDIOL 10 MG PO TABS
10.0000 mg | ORAL_TABLET | Freq: Every day | ORAL | 0 refills | Status: DC
  Filled 2022-05-05 – 2022-06-09 (×3): qty 90, 90d supply, fill #0

## 2022-05-05 MED ORDER — METFORMIN HCL ER 500 MG PO TB24
1000.0000 mg | ORAL_TABLET | Freq: Two times a day (BID) | ORAL | 2 refills | Status: DC
  Filled 2022-05-05 – 2022-05-07 (×2): qty 180, 45d supply, fill #0

## 2022-05-07 ENCOUNTER — Other Ambulatory Visit (HOSPITAL_BASED_OUTPATIENT_CLINIC_OR_DEPARTMENT_OTHER): Payer: Self-pay

## 2022-05-08 ENCOUNTER — Other Ambulatory Visit (HOSPITAL_COMMUNITY): Payer: Self-pay

## 2022-05-11 DIAGNOSIS — N1831 Chronic kidney disease, stage 3a: Secondary | ICD-10-CM | POA: Diagnosis not present

## 2022-05-12 ENCOUNTER — Other Ambulatory Visit (HOSPITAL_BASED_OUTPATIENT_CLINIC_OR_DEPARTMENT_OTHER): Payer: Self-pay

## 2022-05-20 DIAGNOSIS — N2581 Secondary hyperparathyroidism of renal origin: Secondary | ICD-10-CM | POA: Diagnosis not present

## 2022-05-20 DIAGNOSIS — D631 Anemia in chronic kidney disease: Secondary | ICD-10-CM | POA: Diagnosis not present

## 2022-05-20 DIAGNOSIS — I129 Hypertensive chronic kidney disease with stage 1 through stage 4 chronic kidney disease, or unspecified chronic kidney disease: Secondary | ICD-10-CM | POA: Diagnosis not present

## 2022-05-20 DIAGNOSIS — N1831 Chronic kidney disease, stage 3a: Secondary | ICD-10-CM | POA: Diagnosis not present

## 2022-05-26 ENCOUNTER — Other Ambulatory Visit: Payer: Self-pay | Admitting: Urology

## 2022-05-26 ENCOUNTER — Other Ambulatory Visit: Payer: Self-pay

## 2022-05-27 ENCOUNTER — Other Ambulatory Visit (HOSPITAL_BASED_OUTPATIENT_CLINIC_OR_DEPARTMENT_OTHER): Payer: Self-pay

## 2022-06-09 ENCOUNTER — Other Ambulatory Visit: Payer: Self-pay

## 2022-06-09 ENCOUNTER — Other Ambulatory Visit: Payer: Self-pay | Admitting: Family Medicine

## 2022-06-09 ENCOUNTER — Other Ambulatory Visit (HOSPITAL_COMMUNITY): Payer: Self-pay | Admitting: Cardiology

## 2022-06-09 ENCOUNTER — Other Ambulatory Visit (HOSPITAL_BASED_OUTPATIENT_CLINIC_OR_DEPARTMENT_OTHER): Payer: Self-pay

## 2022-06-09 DIAGNOSIS — E1165 Type 2 diabetes mellitus with hyperglycemia: Secondary | ICD-10-CM

## 2022-06-09 DIAGNOSIS — E785 Hyperlipidemia, unspecified: Secondary | ICD-10-CM

## 2022-06-09 MED ORDER — MOUNJARO 7.5 MG/0.5ML ~~LOC~~ SOAJ
7.5000 mg | SUBCUTANEOUS | 0 refills | Status: AC
  Filled 2022-06-09: qty 2, 28d supply, fill #0

## 2022-06-09 MED ORDER — ATORVASTATIN CALCIUM 40 MG PO TABS
40.0000 mg | ORAL_TABLET | Freq: Every day | ORAL | 3 refills | Status: DC
  Filled 2022-06-09: qty 90, 90d supply, fill #0
  Filled 2023-04-18: qty 90, 90d supply, fill #1

## 2022-06-09 MED ORDER — CARVEDILOL 25 MG PO TABS
25.0000 mg | ORAL_TABLET | Freq: Two times a day (BID) | ORAL | 0 refills | Status: DC
  Filled 2022-06-09: qty 180, 90d supply, fill #0

## 2022-06-09 MED ORDER — "SYRINGE/NEEDLE (DISP) 18G X 1-1/2"" 3 ML MISC"
1 refills | Status: DC
  Filled 2022-06-09: qty 4, 4d supply, fill #0
  Filled 2022-10-28: qty 4, 4d supply, fill #1

## 2022-06-10 ENCOUNTER — Encounter: Payer: Self-pay | Admitting: Family Medicine

## 2022-06-10 ENCOUNTER — Ambulatory Visit (INDEPENDENT_AMBULATORY_CARE_PROVIDER_SITE_OTHER): Payer: 59 | Admitting: Family Medicine

## 2022-06-10 VITALS — BP 108/72 | HR 72 | Temp 97.8°F | Ht 73.0 in | Wt 192.0 lb

## 2022-06-10 DIAGNOSIS — E1165 Type 2 diabetes mellitus with hyperglycemia: Secondary | ICD-10-CM | POA: Diagnosis not present

## 2022-06-10 LAB — HEMOGLOBIN A1C: Hgb A1c MFr Bld: 6.4 % (ref 4.6–6.5)

## 2022-06-10 NOTE — Patient Instructions (Addendum)
Give [place] 2-3 business days to get the results of your labs back. Our follow up will be based on your results.  Let's stop the metformin and keep trying to increase the [facility].   Keep the diet clean and stay active.  Please scheduled your eye exam.   Let us know if you need anything.

## 2022-06-10 NOTE — Progress Notes (Signed)
Subjective:   Chief Complaint  Patient presents with   Follow-up    3 month    Michael Guerra is a 62 y.o. male here for follow-up of diabetes.   [place] does not routinely check his sugars.  Patient does not require insulin.   Medications include: Mounjaro 7.5 mg/week, Metformin (causing diarrhea lately), Farxiga 10 mg/d.  Diet is fair.  Exercise: lifting wts, walking No Cp or SOB.   Past Medical History:  Diagnosis Date   CAD (coronary artery disease) 06/08/2019   Cardiomyopathy ([place]) 05/23/2019   Cataract    Chest discomfort 05/12/2019   CHF (congestive heart failure) ([place])    Chronic kidney disease    one kidney that is healthy   Chronic left shoulder pain 02/05/2020   Diabetes mellitus due to underlying condition with unspecified complications ([place]) 0000000   Essential hypertension 05/12/2019   Foot sprain, left, initial encounter 06/22/2019   Hyperlipidemia 02/17/2006   Qualifier: Diagnosis of  By: Annamaria Boots MD, Ysidro Evert     LIVER FUNCTION TESTS, ABNORMAL 05/21/2006   Qualifier: Diagnosis of  By: Annamaria Boots MD, Ysidro Evert     Mixed dyslipidemia 05/12/2019   Other specified abnormal findings of blood chemistry 05/20/2011   Formatting of this note might be different from the original. Elevated liver function tests 10/1 IMO update   Rectal polyp 05/20/2011   Formatting of this note might be different from the original. Noted march 2011 fu in march 2016   Renal insufficiency 05/23/2019   Skin lesion of chest wall 05/20/2011   Formatting of this note might be different from the original. Mid chest (prob seb/epidermoid cyst)   Solitary kidney    Type 2 diabetes mellitus with hyperglycemia, without long-term current use of insulin ([place]) 02/17/2006   Qualifier: Diagnosis of  By: Annamaria Boots MD, Ysidro Evert       Related testing: Retinal exam: Due Pneumovax: done  Objective:  BP 108/72 (BP Location: Right Arm, Patient Position: Sitting, Cuff Size: Normal)   Pulse 72   Temp 97.8 F (36.6 C) (Oral)   Ht 6'  1" (1.854 m)   Wt 192 lb (87.1 kg)   SpO2 99%   BMI 25.33 kg/m  General:  Well developed, well nourished, in no apparent distress Skin:  Warm, no pallor or diaphoresis on exposed skin Lungs:  CTAB, no access msc use Cardio:  RRR, no bruits, no LE edema Psych: Age appropriate judgment and insight  Assessment:   Type 2 diabetes mellitus with hyperglycemia, without long-term current use of insulin (HCC) - Plan: Hemoglobin A1c   Plan:   Chronic, unsure if stable. Adverse effect of medication (diarrhea w metformin). Stop metformin. Cont Farxiga 10 mg/d. Cont ot increase Mounjaro every 4 weeks. Currently on 7.5 mg/week. Counseled on diet and exercise. Needs to schedule eye exam.  F/u in 3-6 mo pending the above. The patient voiced understanding and agreement to the plan.  [place], DO 06/10/22 8:16 AM

## 2022-06-12 ENCOUNTER — Other Ambulatory Visit (HOSPITAL_BASED_OUTPATIENT_CLINIC_OR_DEPARTMENT_OTHER): Payer: Self-pay

## 2022-07-07 ENCOUNTER — Other Ambulatory Visit (HOSPITAL_BASED_OUTPATIENT_CLINIC_OR_DEPARTMENT_OTHER): Payer: Self-pay

## 2022-07-07 ENCOUNTER — Other Ambulatory Visit: Payer: Self-pay

## 2022-07-07 ENCOUNTER — Other Ambulatory Visit: Payer: Self-pay | Admitting: Urology

## 2022-07-09 ENCOUNTER — Other Ambulatory Visit (HOSPITAL_BASED_OUTPATIENT_CLINIC_OR_DEPARTMENT_OTHER): Payer: Self-pay

## 2022-07-09 MED ORDER — TESTOSTERONE CYPIONATE 200 MG/ML IM SOLN
100.0000 mg | INTRAMUSCULAR | 1 refills | Status: DC
  Filled 2022-07-09: qty 2, 28d supply, fill #0
  Filled 2022-09-01: qty 2, 28d supply, fill #1

## 2022-07-13 ENCOUNTER — Other Ambulatory Visit (HOSPITAL_BASED_OUTPATIENT_CLINIC_OR_DEPARTMENT_OTHER): Payer: Self-pay

## 2022-07-14 ENCOUNTER — Other Ambulatory Visit (HOSPITAL_BASED_OUTPATIENT_CLINIC_OR_DEPARTMENT_OTHER): Payer: Self-pay

## 2022-07-17 ENCOUNTER — Other Ambulatory Visit (HOSPITAL_BASED_OUTPATIENT_CLINIC_OR_DEPARTMENT_OTHER): Payer: Self-pay

## 2022-07-20 ENCOUNTER — Other Ambulatory Visit (HOSPITAL_BASED_OUTPATIENT_CLINIC_OR_DEPARTMENT_OTHER): Payer: Self-pay

## 2022-07-21 ENCOUNTER — Other Ambulatory Visit: Payer: Self-pay

## 2022-07-21 ENCOUNTER — Other Ambulatory Visit (HOSPITAL_BASED_OUTPATIENT_CLINIC_OR_DEPARTMENT_OTHER): Payer: Self-pay

## 2022-07-23 ENCOUNTER — Encounter: Payer: Self-pay | Admitting: Cardiology

## 2022-07-23 ENCOUNTER — Ambulatory Visit: Payer: 59 | Attending: Cardiology | Admitting: Cardiology

## 2022-07-23 VITALS — BP 98/62 | HR 74 | Ht 73.0 in | Wt 187.0 lb

## 2022-07-23 DIAGNOSIS — I5020 Unspecified systolic (congestive) heart failure: Secondary | ICD-10-CM

## 2022-07-23 DIAGNOSIS — I10 Essential (primary) hypertension: Secondary | ICD-10-CM | POA: Diagnosis not present

## 2022-07-23 DIAGNOSIS — E782 Mixed hyperlipidemia: Secondary | ICD-10-CM

## 2022-07-23 DIAGNOSIS — N289 Disorder of kidney and ureter, unspecified: Secondary | ICD-10-CM

## 2022-07-23 DIAGNOSIS — I428 Other cardiomyopathies: Secondary | ICD-10-CM

## 2022-07-23 DIAGNOSIS — I2510 Atherosclerotic heart disease of native coronary artery without angina pectoris: Secondary | ICD-10-CM

## 2022-07-23 DIAGNOSIS — E088 Diabetes mellitus due to underlying condition with unspecified complications: Secondary | ICD-10-CM

## 2022-07-23 NOTE — Progress Notes (Signed)
Cardiology Office Note:    Date:  07/23/2022   ID:  [name], DOB May 18, 1960, MRN [id number]  PCP:  Shelda Pal, DO  Cardiologist:  Jenean Lindau, MD   Referring MD: Shelda Pal*    ASSESSMENT:    1. Coronary artery disease involving native coronary artery of native heart without angina pectoris   2. Other cardiomyopathy   3. Systolic congestive heart failure, unspecified HF chronicity   4. Essential hypertension   5. Diabetes mellitus due to underlying condition with unspecified complications   6. Renal insufficiency   7. Mixed dyslipidemia    PLAN:    In order of problems listed above:  Secondary prevention stressed with the patient.  Importance of compliance with diet medication stressed and patient verbalized standing.  He was advised to walk at least half an hour a day 5 days a week and he promises to do so. Essential hypertension: Blood pressure stable and diet was emphasized.  He is asymptomatic.  Adequate hydration and summer months was emphasized. Mixed dyslipidemia: On lipid-lowering medications followed by primary care.  Lipids reviewed. History of cardiomyopathy: On guideline directed medical therapy.  Systolic function has improved.  It has been 1 year since last evaluation and will repeat echocardiogram. Patient is on multiple medications and will have a Chem-7. Diabetes mellitus: Managed by primary care.  Diet emphasized. Patient will be seen in follow-up appointment in 6 months or earlier if the patient has any concerns.   Medication Adjustments/Labs and Tests Ordered: Current medicines are reviewed at length with the patient today.  Concerns regarding medicines are outlined above.  No orders of the defined types were placed in this encounter.  No orders of the defined types were placed in this encounter.    No chief complaint on file.    History of Present Illness:    Michael Guerra is a 62 y.o. male.  Patient has past  medical history of coronary artery disease, ischemic cardiomyopathy, essential hypertension, mixed dyslipidemia and diabetes mellitus.  He also has renal insufficiency.  He denies any problems at this time and takes care of activities of daily living without any problem.  No chest pain orthopnea or PND.  At the time of my evaluation, the patient is alert awake oriented and in no distress.  He walks on a regular basis.  Past Medical History:  Diagnosis Date   CAD (coronary artery disease) 06/08/2019   Cardiomyopathy 05/23/2019   Cataract    Chest discomfort 05/12/2019   CHF (congestive heart failure)    Chronic kidney disease    one kidney that is healthy   Chronic left shoulder pain 02/05/2020   Diabetes mellitus due to underlying condition with unspecified complications 0000000   Essential hypertension 05/12/2019   Foot sprain, left, initial encounter 06/22/2019   Hyperlipidemia 02/17/2006   Qualifier: Diagnosis of  By: Annamaria Boots MD, Ysidro Evert     LIVER FUNCTION TESTS, ABNORMAL 05/21/2006   Qualifier: Diagnosis of  By: Annamaria Boots MD, Ysidro Evert     Mixed dyslipidemia 05/12/2019   Other specified abnormal findings of blood chemistry 05/20/2011   Formatting of this note might be different from the original. Elevated liver function tests 10/1 IMO update   Rectal polyp 05/20/2011   Formatting of this note might be different from the original. Noted march 2011 fu in march 2016   Renal insufficiency 05/23/2019   Skin lesion of chest wall 05/20/2011   Formatting of this note might be different from  the original. Mid chest (prob seb/epidermoid cyst)   Solitary kidney    Type 2 diabetes mellitus with hyperglycemia, without long-term current use of insulin 02/17/2006   Qualifier: Diagnosis of  By: Annamaria Boots MD, Ysidro Evert      Past Surgical History:  Procedure Laterality Date   ABDOMINAL SURGERY     KIDNEY SURGERY     bowel obstruction  x 3   LEFT HEART CATH AND CORONARY ANGIOGRAPHY N/A 05/31/2019   Procedure: LEFT HEART  CATH AND CORONARY ANGIOGRAPHY;  Surgeon: Wellington Hampshire, MD;  Location: [facility];  Service: Cardiovascular;  Laterality: N/A;    Current Medications: Current Meds  Medication Sig   acetaminophen (TYLENOL) 500 MG tablet Take 1,000 mg by mouth every 6 (six) hours as needed for moderate pain or headache.   aspirin 81 MG tablet Take 81 mg by mouth daily.   atorvastatin (LIPITOR) 40 MG tablet Take 1 tablet (40 mg total) by mouth daily.   Blood Glucose Monitoring Suppl (FREESTYLE LITE) w/Device KIT Use daily to check blood sugar   carvedilol (COREG) 25 MG tablet Take 1 tablet (25 mg total) by mouth 2 (two) times daily with a meal.   dapagliflozin propanediol (FARXIGA) 10 MG TABS tablet Take 1 tablet (10 mg total) by mouth daily.   glucose blood (FREESTYLE LITE) test strip Use daily to check blood sugar   Lactobacillus (PROBIOTIC ACIDOPHILUS PO) Take 1 tablet by mouth daily in the afternoon.   Lancets (FREESTYLE) lancets Use to check blood sugars daily   Melatonin 10 MG CAPS Take 30 mg by mouth at bedtime as needed for sleep (sleep).   Multiple Vitamins-Minerals (MULTIVITAMIN ADULT) TABS Take 1 tablet by mouth daily with lunch.    mupirocin ointment (BACTROBAN) 2 % Apply 1 application. topically as needed for rash.   NEEDLE, DISP, 22 G (EASY TOUCH HYPODERMIC NEEDLE) 22G X 1-1/2" MISC Use as directed with testosterone.   NEEDLE, DISP, 23 G 23G X 1" MISC Use as directed with testosterone.   Omega-3 Fatty Acids (FISH OIL) 1000 MG CAPS Take 1,000 mg by mouth daily.   sacubitril-valsartan (ENTRESTO) 97-103 MG Take 1 tablet by mouth 2 (two) times daily.   spironolactone (ALDACTONE) 25 MG tablet Take 1/2 tablet (12.5 mg total) by mouth daily.   SYRINGE-NEEDLE, DISP, 3 ML (B-D 3CC LUER-LOK SYR 18GX1-1/2) 18G X 1-1/2" 3 ML MISC Use as directed with testosterone.   testosterone cypionate (DEPOTESTOSTERONE CYPIONATE) 200 MG/ML injection Inject 0.51mls into the muscle once weekly   testosterone  cypionate (DEPOTESTOSTERONE CYPIONATE) 200 MG/ML injection Inject 0.5 mLs (100 mg total) into the muscle once a week.   tirzepatide (MOUNJARO) 10 MG/0.5ML Pen Inject 10 mg into the skin once a week.   triamcinolone ointment (KENALOG) 0.1 % Apply 1 application topically 2 (two) times daily as needed for rash.     Allergies:   Lisinopril   Social History   Socioeconomic History   Marital status: Married    Spouse name: Not on file   Number of children: Not on file   Years of education: Not on file   Highest education level: Not on file  Occupational History   Not on file  Tobacco Use   Smoking status: Never   Smokeless tobacco: Never  Substance and Sexual Activity   Alcohol use: No   Drug use: No   Sexual activity: Not on file  Other Topics Concern   Not on file  Social History Narrative   Not  on file   Social Determinants of Health   Financial Resource Strain: Not on file  Food Insecurity: Not on file  Transportation Needs: Not on file  Physical Activity: Not on file  Stress: Not on file  Social Connections: Not on file     Family History: The patient's family history is negative for Cancer, Colon cancer, Esophageal cancer, Rectal cancer, and Stomach cancer.  ROS:   Please see the history of present illness.    All other systems reviewed and are negative.  EKGs/Labs/Other Studies Reviewed:    The following studies were reviewed today: I discussed the findings with the patient at length.   Recent Labs: 03/11/2022: ALT 28; BUN 20; Creat 1.59; Hemoglobin 15.4; Platelets 170; Potassium 4.4; Sodium 139  Recent Lipid Panel    Component Value Date/Time   CHOL 102 03/11/2022 1509   CHOL 130 02/17/2021 0855   TRIG 179 (H) 03/11/2022 1509   HDL 38 (L) 03/11/2022 1509   HDL 48 02/17/2021 0855   CHOLHDL 2.7 03/11/2022 1509   VLDL 13.6 09/01/2021 0828   LDLCALC 38 03/11/2022 1509   LDLDIRECT 97.0 10/18/2017 0941    Physical Exam:    VS:  BP 98/62   Pulse 74    Ht 6\' 1"  (1.854 m)   Wt 187 lb 0.6 oz (84.8 kg)   SpO2 96%   BMI 24.68 kg/m     Wt Readings from Last 3 Encounters:  07/23/22 187 lb 0.6 oz (84.8 kg)  06/10/22 192 lb (87.1 kg)  03/11/22 206 lb 12.8 oz (93.8 kg)     GEN: Patient is in no acute distress HEENT: Normal NECK: No JVD; No carotid bruits LYMPHATICS: No lymphadenopathy CARDIAC: Hear sounds regular, 2/6 systolic murmur at the apex. RESPIRATORY:  Clear to auscultation without rales, wheezing or rhonchi  ABDOMEN: Soft, non-tender, non-distended MUSCULOSKELETAL:  No edema; No deformity  SKIN: Warm and dry NEUROLOGIC:  Alert and oriented x 3 PSYCHIATRIC:  Normal affect   Signed, Jenean Lindau, MD  07/23/2022 2:29 PM    [place]

## 2022-07-23 NOTE — Patient Instructions (Signed)
Medication Instructions:  Your physician recommends that you continue on your current medications as directed. Please refer to the Current Medication list given to you today.  *If you need a refill on your cardiac medications before your next appointment, please call your pharmacy*   Lab Work: Your physician recommends that you have a BMP done today in the office.  If you have labs (blood work) drawn today and your tests are completely normal, you will receive your results only by: [place] (if you have MyChart) OR A paper copy in the mail If you have any lab test that is abnormal or we need to change your treatment, we will call you to review the results.   Testing/Procedures: Your physician has requested that you have an echocardiogram. Echocardiography is a painless test that uses sound waves to create images of your heart. It provides your doctor with information about the size and shape of your heart and how well your heart's chambers and valves are working. This procedure takes approximately one hour. There are no restrictions for this procedure. Please do NOT wear cologne, perfume, aftershave, or lotions (deodorant is allowed). Please arrive 15 minutes prior to your appointment time.     Follow-Up: At [facility], you and your health needs are our priority.  As part of our continuing mission to provide you with exceptional heart care, we have created designated Provider Care Teams.  These Care Teams include your primary Cardiologist (physician) and Advanced Practice Providers (APPs -  Physician Assistants and Nurse Practitioners) who all work together to provide you with the care you need, when you need it.  We recommend signing up for the patient portal called "MyChart".  Sign up information is provided on this After Visit Summary.  MyChart is used to connect with patients for Virtual Visits (Telemedicine).  Patients are able to view lab/test results, encounter notes,  upcoming appointments, etc.  Non-urgent messages can be sent to your provider as well.   To learn more about what you can do with MyChart, go to [email or web address].    Your next appointment:   9 month(s)  The format for your next appointment:   In Person  Provider:   Jyl Heinz, MD   Other Instructions Echocardiogram An echocardiogram is a test that uses sound waves (ultrasound) to produce images of the heart. Images from an echocardiogram can provide important information about: Heart size and shape. The size and thickness and movement of your heart's walls. Heart muscle function and strength. Heart valve function or if you have stenosis. Stenosis is when the heart valves are too narrow. If blood is flowing backward through the heart valves (regurgitation). A tumor or infectious growth around the heart valves. Areas of heart muscle that are not working well because of poor blood flow or injury from a heart attack. Aneurysm detection. An aneurysm is a weak or damaged part of an artery wall. The wall bulges out from the normal force of blood pumping through the body. Tell a health care provider about: Any allergies you have. All medicines you are taking, including vitamins, herbs, eye drops, creams, and over-the-counter medicines. Any blood disorders you have. Any surgeries you have had. Any medical conditions you have. Whether you are pregnant or may be pregnant. What are the risks? Generally, this is a safe test. However, problems may occur, including an allergic reaction to dye (contrast) that may be used during the test. What happens before the test? No specific preparation is needed. You  may eat and drink normally. What happens during the test? You will take off your clothes from the waist up and put on a hospital gown. Electrodes or electrocardiogram (ECG)patches may be placed on your chest. The electrodes or patches are then connected to a device that monitors  your heart rate and rhythm. You will lie down on a table for an ultrasound exam. A gel will be applied to your chest to help sound waves pass through your skin. A handheld device, called a transducer, will be pressed against your chest and moved over your heart. The transducer produces sound waves that travel to your heart and bounce back (or "echo" back) to the transducer. These sound waves will be captured in real-time and changed into images of your heart that can be viewed on a video monitor. The images will be recorded on a computer and reviewed by your health care provider. You may be asked to change positions or hold your breath for a short time. This makes it easier to get different views or better views of your heart. In some cases, you may receive contrast through an IV in one of your veins. This can improve the quality of the pictures from your heart. The procedure may vary among health care providers and hospitals.   What can I expect after the test? You may return to your normal, everyday life, including diet, activities, and medicines, unless your health care provider tells you not to do that. Follow these instructions at home: It is up to you to get the results of your test. Ask your health care provider, or the department that is doing the test, when your results will be ready. Keep all follow-up visits. This is important. Summary An echocardiogram is a test that uses sound waves (ultrasound) to produce images of the heart. Images from an echocardiogram can provide important information about the size and shape of your heart, heart muscle function, heart valve function, and other possible heart problems. You do not need to do anything to prepare before this test. You may eat and drink normally. After the echocardiogram is completed, you may return to your normal, everyday life, unless your health care provider tells you not to do that. This information is not intended to replace  advice given to you by your health care provider. Make sure you discuss any questions you have with your health care provider. Document Revised: 11/28/2019 Document Reviewed: 11/28/2019 Elsevier Patient Education  [place]

## 2022-07-24 LAB — BASIC METABOLIC PANEL WITH GFR
BUN/Creatinine Ratio: 13 (ref 10–24)
BUN: 18 mg/dL (ref 8–27)
CO2: 20 mmol/L (ref 20–29)
Calcium: 9.2 mg/dL (ref 8.6–10.2)
Chloride: 105 mmol/L (ref 96–106)
Creatinine, Ser: 1.43 mg/dL — ABNORMAL HIGH (ref 0.76–1.27)
Glucose: 108 mg/dL — ABNORMAL HIGH (ref 70–99)
Potassium: 4 mmol/L (ref 3.5–5.2)
Sodium: 141 mmol/L (ref 134–144)
eGFR: 55 mL/min/{1.73_m2} — ABNORMAL LOW

## 2022-07-28 ENCOUNTER — Other Ambulatory Visit (HOSPITAL_COMMUNITY): Payer: Self-pay

## 2022-07-28 ENCOUNTER — Other Ambulatory Visit: Payer: Self-pay | Admitting: Family Medicine

## 2022-07-28 DIAGNOSIS — E1165 Type 2 diabetes mellitus with hyperglycemia: Secondary | ICD-10-CM

## 2022-07-28 MED ORDER — MOUNJARO 10 MG/0.5ML ~~LOC~~ SOAJ
10.0000 mg | SUBCUTANEOUS | 0 refills | Status: DC
  Filled 2022-07-28: qty 2, 28d supply, fill #0

## 2022-08-03 ENCOUNTER — Encounter: Payer: Self-pay | Admitting: *Deleted

## 2022-08-12 ENCOUNTER — Ambulatory Visit (HOSPITAL_BASED_OUTPATIENT_CLINIC_OR_DEPARTMENT_OTHER)
Admission: RE | Admit: 2022-08-12 | Discharge: 2022-08-12 | Disposition: A | Discharge status: Home / Self Care | Payer: 59 | Source: Ambulatory Visit | Attending: Cardiology | Admitting: Cardiology

## 2022-08-12 DIAGNOSIS — I2510 Atherosclerotic heart disease of native coronary artery without angina pectoris: Secondary | ICD-10-CM | POA: Insufficient documentation

## 2022-08-12 DIAGNOSIS — I5020 Unspecified systolic (congestive) heart failure: Secondary | ICD-10-CM | POA: Insufficient documentation

## 2022-08-12 DIAGNOSIS — I428 Other cardiomyopathies: Secondary | ICD-10-CM | POA: Insufficient documentation

## 2022-08-12 LAB — ECHOCARDIOGRAM COMPLETE
Area-P 1/2: 6.12 cm2
Calc EF: 53.3 %
S' Lateral: 3.9 cm
Single Plane A2C EF: 56.4 %
Single Plane A4C EF: 51.6 %

## 2022-09-01 ENCOUNTER — Other Ambulatory Visit (HOSPITAL_BASED_OUTPATIENT_CLINIC_OR_DEPARTMENT_OTHER): Payer: Self-pay

## 2022-09-01 MED ORDER — TESTOSTERONE CYPIONATE 200 MG/ML IM SOLN
100.0000 mg | INTRAMUSCULAR | 1 refills | Status: DC
  Filled 2022-09-01: qty 2, 28d supply, fill #0
  Filled 2022-10-13: qty 10, 70d supply, fill #0

## 2022-09-02 ENCOUNTER — Other Ambulatory Visit (HOSPITAL_BASED_OUTPATIENT_CLINIC_OR_DEPARTMENT_OTHER): Payer: Self-pay

## 2022-09-02 ENCOUNTER — Telehealth: Payer: 59 | Admitting: Nurse Practitioner

## 2022-09-02 DIAGNOSIS — J069 Acute upper respiratory infection, unspecified: Secondary | ICD-10-CM | POA: Diagnosis not present

## 2022-09-02 MED ORDER — BENZONATATE 100 MG PO CAPS
100.0000 mg | ORAL_CAPSULE | Freq: Three times a day (TID) | ORAL | 0 refills | Status: DC | PRN
Start: 2022-09-02 — End: 2022-12-09
  Filled 2022-09-02: qty 30, 10d supply, fill #0

## 2022-09-02 MED ORDER — IPRATROPIUM BROMIDE 0.03 % NA SOLN
2.0000 | Freq: Two times a day (BID) | NASAL | 12 refills | Status: AC
Start: 2022-09-02 — End: ?
  Filled 2022-09-02: qty 30, 75d supply, fill #0

## 2022-09-02 NOTE — Progress Notes (Signed)
E-Visit for Upper Respiratory Infection   We are sorry you are not feeling well.  Here is how we plan to help!  Based on what you have shared with me, it looks like you may have a viral upper respiratory infection.  Upper respiratory infections are caused by a large number of viruses; however, rhinovirus is the most common cause. You may have had the flu, but you are outside of the window for Tamiflu or an anti-viral for flu at this time.   Symptoms vary from person to person, with common symptoms including sore throat, cough, fatigue or lack of energy and feeling of general discomfort.  A low-grade fever of up to 100.4 may present, but is often uncommon.  Symptoms vary however, and are closely related to a person's age or underlying illnesses.  The most common symptoms associated with an upper respiratory infection are nasal discharge or congestion, cough, sneezing, headache and pressure in the ears and face.  These symptoms usually persist for about 3 to 10 days, but can last up to 2 weeks.  It is important to know that upper respiratory infections do not cause serious illness or complications in most cases.    Upper respiratory infections can be transmitted from person to person, with the most common method of transmission being a person's hands.  The virus is able to live on the skin and can infect other persons for up to 2 hours after direct contact.  Also, these can be transmitted when someone coughs or sneezes; thus, it is important to cover the mouth to reduce this risk.  To keep the spread of the illness at bay, good hand hygiene is very important.  This is an infection that is most likely caused by a virus. There are no specific treatments other than to help you with the symptoms until the infection runs its course.  We are sorry you are not feeling well.  Here is how we plan to help!   For nasal congestion, you may use an oral decongestants such as Mucinex D or if you have glaucoma or high  blood pressure use plain Mucinex.  Saline nasal spray or nasal drops can help and can safely be used as often as needed for congestion.  For your congestion, I have prescribed Ipratropium Bromide nasal spray 0.03% two sprays in each nostril 2-3 times a day  If you do not have a history of heart disease, hypertension, diabetes or thyroid disease, prostate/bladder issues or glaucoma, you may also use Sudafed to treat nasal congestion.  It is highly recommended that you consult with a pharmacist or your primary care physician to ensure this medication is safe for you to take.     If you have a cough, you may use cough suppressants such as Delsym and Robitussin.  If you have glaucoma or high blood pressure, you can also use Coricidin HBP.   For cough I have prescribed for you A prescription cough medication called Tessalon Perles 100 mg. You may take 1-2 capsules every 8 hours as needed for cough  If you have a sore or scratchy throat, use a saltwater gargle-  to  teaspoon of salt dissolved in a 4-ounce to 8-ounce glass of warm water.  Gargle the solution for approximately 15-30 seconds and then spit.  It is important not to swallow the solution.  You can also use throat lozenges/cough drops and Chloraseptic spray to help with throat pain or discomfort.  Warm or cold liquids can also  be helpful in relieving throat pain.  For headache, pain or general discomfort, you can use Ibuprofen or Tylenol as directed.   Some authorities believe that zinc sprays or the use of Echinacea may shorten the course of your symptoms.   HOME CARE Only take medications as instructed by your medical team. Be sure to drink plenty of fluids. Water is fine as well as fruit juices, sodas and electrolyte beverages. You may want to stay away from caffeine or alcohol. If you are nauseated, try taking small sips of liquids. How do you know if you are getting enough fluid? Your urine should be a pale yellow or almost colorless. Get  rest. Taking a steamy shower or using a humidifier may help nasal congestion and ease sore throat pain. You can place a towel over your head and breathe in the steam from hot water coming from a faucet. Using a saline nasal spray works much the same way. Cough drops, hard candies and sore throat lozenges may ease your cough. Avoid close contacts especially the very young and the elderly Cover your mouth if you cough or sneeze Always remember to wash your hands.   GET HELP RIGHT AWAY IF: You develop worsening fever. If your symptoms do not improve within 10 days You develop yellow or green discharge from your nose over 3 days. You have coughing fits You develop a severe head ache or visual changes. You develop shortness of breath, difficulty breathing or start having chest pain Your symptoms persist after you have completed your treatment plan  MAKE SURE YOU  Understand these instructions. Will watch your condition. Will get help right away if you are not doing well or get worse.  Thank you for choosing an e-visit.  Your e-visit answers were reviewed by a board certified advanced clinical practitioner to complete your personal care plan. Depending upon the condition, your plan could have included both over the counter or prescription medications.  Please review your pharmacy choice. Make sure the pharmacy is open so you can pick up prescription now. If there is a problem, you may contact your provider through [employer] and have the prescription routed to another pharmacy.  Your safety is important to [place]. If you have drug allergies check your prescription carefully.   For the next 24 hours you can use MyChart to ask questions about today's visit, request a non-urgent call back, or ask for a work or school excuse. You will get an email in the next two days asking about your experience. I hope that your e-visit has been valuable and will speed your recovery.  Meds ordered this  encounter  Medications   ipratropium (ATROVENT) 0.03 % nasal spray    Sig: Place 2 sprays into both nostrils every 12 (twelve) hours.    Dispense:  30 mL    Refill:  12   benzonatate (TESSALON) 100 MG capsule    Sig: Take 1 capsule (100 mg total) by mouth 3 (three) times daily as needed.    Dispense:  30 capsule    Refill:  0     I spent approximately 5 minutes reviewing the patient's history, current symptoms and coordinating their care today.

## 2022-09-08 ENCOUNTER — Other Ambulatory Visit (HOSPITAL_BASED_OUTPATIENT_CLINIC_OR_DEPARTMENT_OTHER): Payer: Self-pay

## 2022-09-08 ENCOUNTER — Other Ambulatory Visit (HOSPITAL_COMMUNITY): Payer: Self-pay

## 2022-09-08 ENCOUNTER — Other Ambulatory Visit: Payer: Self-pay | Admitting: Family Medicine

## 2022-09-08 DIAGNOSIS — E1165 Type 2 diabetes mellitus with hyperglycemia: Secondary | ICD-10-CM

## 2022-09-08 MED ORDER — TIRZEPATIDE 15 MG/0.5ML ~~LOC~~ SOAJ
15.0000 mg | SUBCUTANEOUS | 1 refills | Status: DC
  Filled 2022-09-08: qty 6, 84d supply, fill #0
  Filled 2022-10-26: qty 2, 28d supply, fill #0

## 2022-09-08 MED ORDER — TIRZEPATIDE 12.5 MG/0.5ML ~~LOC~~ SOAJ
12.5000 mg | SUBCUTANEOUS | 0 refills | Status: AC
  Filled 2022-09-08 – 2022-09-11 (×2): qty 2, 28d supply, fill #0

## 2022-09-08 NOTE — Telephone Encounter (Signed)
Called the patient and he said he would try the 12.5 and can drop back down to 10 if need to .

## 2022-09-09 ENCOUNTER — Other Ambulatory Visit (HOSPITAL_COMMUNITY): Payer: Self-pay

## 2022-09-10 ENCOUNTER — Other Ambulatory Visit (HOSPITAL_COMMUNITY): Payer: Self-pay

## 2022-09-11 ENCOUNTER — Telehealth: Payer: Self-pay

## 2022-09-11 ENCOUNTER — Other Ambulatory Visit (HOSPITAL_BASED_OUTPATIENT_CLINIC_OR_DEPARTMENT_OTHER): Payer: Self-pay

## 2022-09-11 NOTE — Telephone Encounter (Signed)
PA initiated via Covermymeds; KEY: [id number]. Awaiting determination.

## 2022-09-15 ENCOUNTER — Other Ambulatory Visit (HOSPITAL_BASED_OUTPATIENT_CLINIC_OR_DEPARTMENT_OTHER): Payer: Self-pay

## 2022-09-16 NOTE — Telephone Encounter (Signed)
PA approved.   The request has been approved. The authorization is effective from 09/11/2022 to 09/10/2023, as long as the member is enrolled in their current health plan. Authorization Expiration Date: 09/09/2023

## 2022-09-16 NOTE — Telephone Encounter (Signed)
Called left detailed message to patient to inform patient of approval.

## 2022-09-24 ENCOUNTER — Other Ambulatory Visit (HOSPITAL_COMMUNITY): Payer: Self-pay

## 2022-10-12 DIAGNOSIS — Z125 Encounter for screening for malignant neoplasm of prostate: Secondary | ICD-10-CM | POA: Diagnosis not present

## 2022-10-12 DIAGNOSIS — E291 Testicular hypofunction: Secondary | ICD-10-CM | POA: Diagnosis not present

## 2022-10-13 ENCOUNTER — Other Ambulatory Visit (HOSPITAL_BASED_OUTPATIENT_CLINIC_OR_DEPARTMENT_OTHER): Payer: Self-pay

## 2022-10-13 ENCOUNTER — Other Ambulatory Visit: Payer: Self-pay

## 2022-10-19 ENCOUNTER — Other Ambulatory Visit (HOSPITAL_BASED_OUTPATIENT_CLINIC_OR_DEPARTMENT_OTHER): Payer: Self-pay

## 2022-10-19 DIAGNOSIS — E291 Testicular hypofunction: Secondary | ICD-10-CM | POA: Diagnosis not present

## 2022-10-19 MED ORDER — TESTOSTERONE CYPIONATE 200 MG/ML IM SOLN
100.0000 mg | INTRAMUSCULAR | 1 refills | Status: AC
  Filled 2022-10-19: qty 10, 28d supply, fill #0
  Filled 2023-04-13: qty 10, 70d supply, fill #0

## 2022-10-26 ENCOUNTER — Other Ambulatory Visit (HOSPITAL_BASED_OUTPATIENT_CLINIC_OR_DEPARTMENT_OTHER): Payer: Self-pay

## 2022-10-26 ENCOUNTER — Other Ambulatory Visit: Payer: Self-pay | Admitting: Family Medicine

## 2022-10-26 MED ORDER — DAPAGLIFLOZIN PROPANEDIOL 10 MG PO TABS
10.0000 mg | ORAL_TABLET | Freq: Every day | ORAL | 3 refills | Status: DC
  Filled 2022-10-26: qty 30, 30d supply, fill #0
  Filled 2022-11-24: qty 30, 30d supply, fill #1
  Filled 2023-01-04: qty 30, 30d supply, fill #2
  Filled 2023-02-11: qty 30, 30d supply, fill #3
  Filled 2023-03-12: qty 30, 30d supply, fill #4
  Filled 2023-04-12: qty 30, 30d supply, fill #5
  Filled 2023-05-24: qty 30, 30d supply, fill #6
  Filled 2023-06-18: qty 30, 30d supply, fill #7
  Filled 2023-07-30 (×2): qty 30, 30d supply, fill #8
  Filled 2023-08-30: qty 30, 30d supply, fill #9
  Filled 2023-10-07: qty 30, 30d supply, fill #10

## 2022-10-28 ENCOUNTER — Other Ambulatory Visit (HOSPITAL_BASED_OUTPATIENT_CLINIC_OR_DEPARTMENT_OTHER): Payer: Self-pay

## 2022-11-03 ENCOUNTER — Other Ambulatory Visit (HOSPITAL_COMMUNITY): Payer: Self-pay

## 2022-11-05 DIAGNOSIS — H5213 Myopia, bilateral: Secondary | ICD-10-CM | POA: Diagnosis not present

## 2022-11-05 LAB — HM DIABETES EYE EXAM

## 2022-11-18 ENCOUNTER — Encounter (INDEPENDENT_AMBULATORY_CARE_PROVIDER_SITE_OTHER): Payer: Self-pay

## 2022-11-24 ENCOUNTER — Other Ambulatory Visit (HOSPITAL_COMMUNITY): Payer: Self-pay | Admitting: Cardiology

## 2022-11-24 ENCOUNTER — Other Ambulatory Visit (HOSPITAL_BASED_OUTPATIENT_CLINIC_OR_DEPARTMENT_OTHER): Payer: Self-pay

## 2022-11-24 MED ORDER — "NEEDLE (DISP) 23G X 1"" MISC"
1 refills | Status: DC
  Filled 2022-11-24: qty 4, 28d supply, fill #0
  Filled 2023-01-04: qty 4, 28d supply, fill #1

## 2022-11-24 MED ORDER — CARVEDILOL 25 MG PO TABS
25.0000 mg | ORAL_TABLET | Freq: Two times a day (BID) | ORAL | 0 refills | Status: DC
  Filled 2022-11-24: qty 60, 30d supply, fill #0

## 2022-11-24 MED ORDER — "SYRINGE/NEEDLE (DISP) 18G X 1-1/2"" 3 ML MISC"
1 refills | Status: DC
  Filled 2022-11-24: qty 4, 28d supply, fill #0
  Filled 2023-01-04: qty 4, 28d supply, fill #1

## 2022-11-25 ENCOUNTER — Other Ambulatory Visit (HOSPITAL_BASED_OUTPATIENT_CLINIC_OR_DEPARTMENT_OTHER): Payer: Self-pay

## 2022-12-09 ENCOUNTER — Encounter: Payer: Self-pay | Admitting: Family Medicine

## 2022-12-09 ENCOUNTER — Other Ambulatory Visit (HOSPITAL_BASED_OUTPATIENT_CLINIC_OR_DEPARTMENT_OTHER): Payer: Self-pay

## 2022-12-09 ENCOUNTER — Ambulatory Visit: Payer: 59 | Admitting: Family Medicine

## 2022-12-09 ENCOUNTER — Other Ambulatory Visit: Payer: Self-pay

## 2022-12-09 VITALS — BP 105/68 | HR 75 | Temp 98.0°F | Resp 16 | Ht 73.0 in | Wt 197.0 lb

## 2022-12-09 DIAGNOSIS — I10 Essential (primary) hypertension: Secondary | ICD-10-CM | POA: Diagnosis not present

## 2022-12-09 DIAGNOSIS — E1165 Type 2 diabetes mellitus with hyperglycemia: Secondary | ICD-10-CM

## 2022-12-09 DIAGNOSIS — E782 Mixed hyperlipidemia: Secondary | ICD-10-CM

## 2022-12-09 LAB — LIPID PANEL
Cholesterol: 251 mg/dL — ABNORMAL HIGH (ref 0–200)
HDL: 44.9 mg/dL (ref >39.00)
LDL Cholesterol: 184 mg/dL — ABNORMAL HIGH (ref 0–99)
NonHDL: 205.73
Total CHOL/HDL Ratio: 6
Triglycerides: 111 mg/dL (ref 0.0–149.0)
VLDL: 22.2 mg/dL (ref 0.0–40.0)

## 2022-12-09 LAB — COMPREHENSIVE METABOLIC PANEL
ALT: 25 U/L (ref 0–53)
AST: 36 U/L (ref 0–37)
Albumin: 4.3 g/dL (ref 3.5–5.2)
Alkaline Phosphatase: 57 U/L (ref 39–117)
BUN: 9 mg/dL (ref 6–23)
CO2: 31 mEq/L (ref 19–32)
Calcium: 9.5 mg/dL (ref 8.4–10.5)
Chloride: 102 mEq/L (ref 96–112)
Creatinine, Ser: 1.45 mg/dL (ref 0.40–1.50)
GFR: 50.9 mL/min — ABNORMAL LOW (ref >60.00)
Glucose, Bld: 100 mg/dL — ABNORMAL HIGH (ref 70–99)
Potassium: 4.8 mEq/L (ref 3.5–5.1)
Sodium: 138 mEq/L (ref 135–145)
Total Bilirubin: 1.8 mg/dL — ABNORMAL HIGH (ref 0.2–1.2)
Total Protein: 7.2 g/dL (ref 6.0–8.3)

## 2022-12-09 LAB — HEMOGLOBIN A1C: Hgb A1c MFr Bld: 5.9 % (ref 4.6–6.5)

## 2022-12-09 LAB — CBC
HCT: 48.6 % (ref 39.0–52.0)
Hemoglobin: 16.3 g/dL (ref 13.0–17.0)
MCHC: 33.5 g/dL (ref 30.0–36.0)
MCV: 91.3 fl (ref 78.0–100.0)
Platelets: 191 10*3/uL (ref 150.0–400.0)
RBC: 5.32 Mil/uL (ref 4.22–5.81)
RDW: 13.8 % (ref 11.5–15.5)
WBC: 2.8 10*3/uL — ABNORMAL LOW (ref 4.0–10.5)

## 2022-12-09 MED ORDER — TIRZEPATIDE 10 MG/0.5ML ~~LOC~~ SOAJ
10.0000 mg | SUBCUTANEOUS | 2 refills | Status: DC
  Filled 2022-12-09 (×3): qty 2, 28d supply, fill #0
  Filled 2023-02-02: qty 2, 28d supply, fill #1
  Filled 2023-03-15: qty 2, 28d supply, fill #2
  Filled 2023-04-13: qty 2, 28d supply, fill #3
  Filled 2023-05-24: qty 2, 28d supply, fill #4
  Filled 2023-07-06: qty 2, 28d supply, fill #5
  Filled 2023-08-31: qty 2, 28d supply, fill #6
  Filled 2023-11-04: qty 2, 28d supply, fill #7

## 2022-12-09 NOTE — Patient Instructions (Signed)
Give us 2-3 business days to get the results of your labs back.   Keep the diet clean and stay active.  I recommend getting the flu shot in mid October. This suggestion would change if the CDC comes out with a different recommendation.   Let us know if you need anything. 

## 2022-12-09 NOTE — Progress Notes (Signed)
Subjective:   Chief Complaint  Patient presents with   Follow-up    Follow up    Michael Guerra is a 62 y.o. male here for follow-up of diabetes.   Michael Guerra does not routinely check his sugars.  Patient does not require insulin.   Medications include: Mounjaro 15 mg/week, Farxiga 10 mg/d Diet is overall healthy.  Exercise: walking, lifting wts  Hyperlipidemia Patient presents for dyslipidemia follow up. Currently being treated with Lipitor 40 mg/d and compliance with treatment thus far has been good. He denies myalgias. Diet/exercise as above.  The patient is known to have coexisting coronary artery disease. No Cp or SOB.   Past Medical History:  Diagnosis Date   CAD (coronary artery disease) 06/08/2019   Cardiomyopathy (HCC) 05/23/2019   Cataract    Chest discomfort 05/12/2019   CHF (congestive heart failure) (HCC)    Chronic kidney disease    one kidney that is healthy   Chronic left shoulder pain 02/05/2020   Diabetes mellitus due to underlying condition with unspecified complications (HCC) 05/12/2019   Essential hypertension 05/12/2019   Foot sprain, left, initial encounter 06/22/2019   Hyperlipidemia 02/17/2006   Qualifier: Diagnosis of  By: Maple Hudson MD, Riki Rusk     LIVER FUNCTION TESTS, ABNORMAL 05/21/2006   Qualifier: Diagnosis of  By: Maple Hudson MD, Riki Rusk     Mixed dyslipidemia 05/12/2019   Other specified abnormal findings of blood chemistry 05/20/2011   Formatting of this note might be different from the original. Elevated liver function tests 10/1 IMO update   Rectal polyp 05/20/2011   Formatting of this note might be different from the original. Noted march 2011 fu in march 2016   Renal insufficiency 05/23/2019   Skin lesion of chest wall 05/20/2011   Formatting of this note might be different from the original. Mid chest (prob seb/epidermoid cyst)   Solitary kidney    Type 2 diabetes mellitus with hyperglycemia, without long-term current use of insulin (HCC) 02/17/2006   Qualifier:  Diagnosis of  By: Maple Hudson MD, Riki Rusk       Related testing: Retinal exam: Done Pneumovax: done  Objective:  BP 105/68 (BP Location: Left Arm, Patient Position: Sitting, Cuff Size: Normal)   Pulse 75   Temp 98 F (36.7 C) (Oral)   Resp 16   Ht 6\' 1"  (1.854 m)   Wt 197 lb (89.4 kg)   SpO2 98%   BMI 25.99 kg/m  General:  Well developed, well nourished, in no apparent distress Skin:  Warm, no pallor or diaphoresis Head:  Normocephalic, atraumatic Eyes:  Pupils equal and round, sclera anicteric without injection  Lungs:  CTAB, no access msc use Cardio:  RRR, no bruits, no LE edema Musculoskeletal:  Symmetrical muscle groups noted without atrophy or deformity Neuro:  Sensation intact to pinprick on feet Psych: Age appropriate judgment and insight  Assessment:   Type 2 diabetes mellitus with hyperglycemia, without long-term current use of insulin (HCC)  Essential hypertension  Mixed dyslipidemia   Plan:   Chronic, stable. Cont Mounjaro 10 mg/week, Farxiga 10 mg/d. Counseled on diet and exercise. Chronic, stable. Cont Lipitor 40 mg/d.  F/u in 6 mo. The patient voiced understanding and agreement to the plan.  [name] [place], DO 12/09/22 8:29 AM

## 2022-12-11 ENCOUNTER — Other Ambulatory Visit (HOSPITAL_COMMUNITY): Payer: Self-pay

## 2023-01-04 ENCOUNTER — Other Ambulatory Visit (HOSPITAL_COMMUNITY): Payer: Self-pay | Admitting: Cardiology

## 2023-01-05 ENCOUNTER — Other Ambulatory Visit (HOSPITAL_BASED_OUTPATIENT_CLINIC_OR_DEPARTMENT_OTHER): Payer: Self-pay

## 2023-01-05 MED ORDER — CARVEDILOL 25 MG PO TABS
25.0000 mg | ORAL_TABLET | Freq: Two times a day (BID) | ORAL | 0 refills | Status: DC
  Filled 2023-01-05: qty 60, 30d supply, fill #0

## 2023-01-13 ENCOUNTER — Encounter: Payer: Self-pay | Admitting: Behavioral Health

## 2023-01-13 ENCOUNTER — Ambulatory Visit: Payer: 59 | Admitting: Behavioral Health

## 2023-01-13 DIAGNOSIS — F411 Generalized anxiety disorder: Secondary | ICD-10-CM

## 2023-01-13 NOTE — Progress Notes (Addendum)
[facility] Behavioral Health Counselor Initial Adult Exam  Name: Michael Guerra Date: 01/13/2023 MRN: [id number] DOB: July 11, 1960 PCP: Sharlene Dory, DO  Time spent: 57 minutes  Guardian/Payee: Self  Paperwork requested: No   Reason for Visit /Presenting Problem: Stress  Michael Guerra is a 62 year old male who lives with his wife and 42-year-old daughter.  He has been married 16 years.  He reports a good relationship with both his wife and his daughter.  He grew up with his biological mother and younger brother.  He has never really had a relationship with his father.  He has a good relationship with his brother but he has some concern about his brother who has some medical issues and some other issues going on.  Mother is deceased.  He has a good relationship with his stepfather.  He has a sister who passed away several years ago.  He does have some friends who are supports.  His wife is a support.  He is able to attend church every other weekend because of his work schedule.  The patient's mother remarried when he was about 34 years old and he grew up locally.  He reports a good relationship with his mother growing up with his brother growing up.  He does have some cousins and aunts and uncles in the area whom he is close to.  Because of the patient's work schedule he typically goes to bed around 730 or 8 when his daughter does because he is up at 345 to do a light workout and has to be at work by 6 AM.  He works 12-hour days typically Monday Tuesday 12 hours, 6 hours on Wednesday off on Thursday Friday Saturday 12 and Sunday 12 hours and off on Monday and Tuesday.  He works every other weekend.  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  He reports that prior to COVID his job was good he changed during COVID and was neutral them but since then because of her responsibilities, being short staffed and a change in the population of the hospital system that he works and  it has become extremely stressful and difficult.  He is finding a hard time maintaining work life balance.  His wife is a Counsellor but also stays at home with her daughter.  He eats very healthy.  Typical breakfast includes oatmeal legs some bacon nuts with raisins.  Lunch is typically some sort of salad and dinner grilled chicken salad.  He has 1 cup of caffeine per day and at times some coffee but it is decaf.  He reports no alcohol use, no smoking and no tobacco use at all and no drugs.  He does have some medical issues including diabetes which she is treating through medication and for the most part appears to be relatively stable on.  One of the medications that he is taking does create some dizziness if he gets up or sits down too quickly but he is very intentional about how he manages that.  He reports no history of trauma or abuse.  He reports that his stress level he has "astronomical.".  He requested a referral to therapy with meeting with his PCP knowing that he needed someone to speak to.   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  Previously she had 3 managers and because of budget restraints  have cut back to 1 manager.  They are increasing regulations as to how security does his job.  He says this department is considered not an income generating sore throat cuts is one of the first to be at cut financially.  There has to be some form of law enforcement in the emergency department 24 hours a day 7 days a week with at least 1 person present.  He reports incidences of disturbance are increasing were more especially in the emergency room and there have been many times where there has been hands on requirements with 1 officer getting a job broken and the other when getting a concussion.  He has spoken to HR about possible other job opportunities and exploring that knowing that this is starting to  have an impact on his health and he does not want to add to that or take that home to his family.  He does have an associates degree in accounting and business management but has no interest in that.  He also knows that he does not necessarily want to go back into security or law enforcement.  He reports that when he is stressed he gets irritable and angry.  He says he does not take it out on anything or anyone says he does feel some tension in his body.  He tries to walk away from the situation and take some deep breaths.  When he has time he enjoys watching TV all of that is fairly rare.  He enjoys going to the lake.  He enjoys swimming although he has very little time to do so.  He spends time with his stepfather his brother.  They also have a niece that they are close to that he spends time with.  He reports that most of his stress comes from work and he does his best to separate that from home.  I introduced the relaxation breathing exercise as well as a grounding exercise for him to practice between now and the next session.  Initial goals will be to help the patient find ways to reduce his stress and anxiety level.  He does contract for safety having no thoughts of hurting himself or anyone else.  Mental Status Exam: Appearance:   Well Groomed     Behavior:  Appropriate  Motor:  Normal  Speech/Language:   Normal Rate  Affect:  Appropriate  Mood:  normal  Thought process:  normal  Thought content:    WNL  Sensory/Perceptual disturbances:    WNL  Orientation:  oriented to person, place, time/date, situation, day of week, and month of year  Attention:  Good  Concentration:  Good  Memory:  WNL  Fund of knowledge:   Good  Insight:    Good  Judgment:   Good  Impulse Control:  Good    Reported Symptoms: Stress  Risk Assessment: Danger to Self:  No Self-injurious Behavior: No Danger to Others: No Duty to Warn:no Physical Aggression / Violence:No  Access to Firearms a concern: No   Gang Involvement:No  Patient / guardian was educated about steps to take if suicide or homicide risk level increases between visits: n/a While future psychiatric events cannot be accurately predicted, the patient does not currently require acute inpatient psychiatric care and does not currently meet [facility] involuntary commitment criteria.  Substance Abuse History: Current substance abuse: No     Past Psychiatric History:   No previous psychological problems have been observed Outpatient Providers: Primary care physician History of Psych  Hospitalization: No  Psychological Testing:  n/a    Abuse History:  Victim of: No.,  n/a    Report needed: No. Victim of Neglect:No. Perpetrator of  n/a   Witness / Exposure to Domestic Violence: None reported  Protective Services Involvement: No  Witness to [employer] Violence:  No   Family History:  Family History  Problem Relation Age of Onset   Cancer Neg Hx    Colon cancer Neg Hx    Esophageal cancer Neg Hx    Rectal cancer Neg Hx    Stomach cancer Neg Hx     Living situation: the patient lives with their family  Sexual Orientation: Straight  Relationship Status: married  Name of spouse / other: If a parent, number of children / ages: 62-year-old daughter  Support Systems: spouse friends  Surveyor, quantity Stress:  No   Income/Employment/Disability: Employment  Financial planner: No   Educational History: Education: Risk manager: Protestant  Any cultural differences that may affect / interfere with treatment:  not applicable   Recreation/Hobbies: Time with family, working out  Stressors: Occupational concerns    Strengths: Supportive Relationships, Spirituality, Journalist, newspaper, and Able to Communicate Effectively  Barriers:     Legal History: Pending legal issue / charges: The patient has no significant history of legal issues. History of legal issue / charges:  n/a  Medical  History/Surgical History: reviewed Past Medical History:  Diagnosis Date   CAD (coronary artery disease) 06/08/2019   Cardiomyopathy (HCC) 05/23/2019   Cataract    Chest discomfort 05/12/2019   CHF (congestive heart failure) (HCC)    Chronic kidney disease    one kidney that is healthy   Chronic left shoulder pain 02/05/2020   Diabetes mellitus due to underlying condition with unspecified complications (HCC) 05/12/2019   Essential hypertension 05/12/2019   Foot sprain, left, initial encounter 06/22/2019   Hyperlipidemia 02/17/2006   Qualifier: Diagnosis of  By: Maple Hudson MD, Riki Rusk     LIVER FUNCTION TESTS, ABNORMAL 05/21/2006   Qualifier: Diagnosis of  By: Maple Hudson MD, Riki Rusk     Mixed dyslipidemia 05/12/2019   Other specified abnormal findings of blood chemistry 05/20/2011   Formatting of this note might be different from the original. Elevated liver function tests 10/1 IMO update   Rectal polyp 05/20/2011   Formatting of this note might be different from the original. Noted march 2011 fu in march 2016   Renal insufficiency 05/23/2019   Skin lesion of chest wall 05/20/2011   Formatting of this note might be different from the original. Mid chest (prob seb/epidermoid cyst)   Solitary kidney    Type 2 diabetes mellitus with hyperglycemia, without long-term current use of insulin (HCC) 02/17/2006   Qualifier: Diagnosis of  By: Maple Hudson MD, Riki Rusk      Past Surgical History:  Procedure Laterality Date   ABDOMINAL SURGERY     KIDNEY SURGERY     bowel obstruction  x 3   LEFT HEART CATH AND CORONARY ANGIOGRAPHY N/A 05/31/2019   Procedure: LEFT HEART CATH AND CORONARY ANGIOGRAPHY;  Surgeon: Iran Ouch, MD;  Location: [facility] INVASIVE CV LAB;  Service: Cardiovascular;  Laterality: N/A;    Medications: Current Outpatient Medications  Medication Sig Dispense Refill   acetaminophen (TYLENOL) 500 MG tablet Take 1,000 mg by mouth every 6 (six) hours as needed for moderate pain or headache.     aspirin 81 MG  tablet Take 81 mg by mouth daily.     atorvastatin (LIPITOR) 40  MG tablet Take 1 tablet (40 mg total) by mouth daily. 90 tablet 3   Blood Glucose Monitoring Suppl (FREESTYLE LITE) w/Device KIT Use daily to check blood sugar 1 kit 0   carvedilol (COREG) 25 MG tablet Take 1 tablet (25 mg total) by mouth 2 (two) times daily with a meal. 60 tablet 0   dapagliflozin propanediol (FARXIGA) 10 MG TABS tablet Take 1 tablet (10 mg total) by mouth daily. 90 tablet 3   glucose blood (FREESTYLE LITE) test strip Use daily to check blood sugar 100 each 3   ipratropium (ATROVENT) 0.03 % nasal spray Place 2 sprays into both nostrils every 12 (twelve) hours. 30 mL 12   Lactobacillus (PROBIOTIC ACIDOPHILUS PO) Take 1 tablet by mouth daily in the afternoon.     Lancets (FREESTYLE) lancets Use to check blood sugars daily 100 each 3   Melatonin 10 MG CAPS Take 30 mg by mouth at bedtime as needed for sleep (sleep).     Multiple Vitamins-Minerals (MULTIVITAMIN ADULT) TABS Take 1 tablet by mouth daily with lunch.      mupirocin ointment (BACTROBAN) 2 % Apply 1 application. topically as needed for rash.     NEEDLE, DISP, 22 G (EASY TOUCH HYPODERMIC NEEDLE) 22G X 1-1/2" MISC Use as directed with testosterone. 4 each 1   NEEDLE, DISP, 23 G (BD DISP NEEDLE) 23G X 1" MISC Use as directed with testosterone. 4 each 1   Omega-3 Fatty Acids (FISH OIL) 1000 MG CAPS Take 1,000 mg by mouth daily.     sacubitril-valsartan (ENTRESTO) 97-103 MG Take 1 tablet by mouth 2 (two) times daily. 60 tablet 11   spironolactone (ALDACTONE) 25 MG tablet Take 1/2 tablet (12.5 mg total) by mouth daily. 30 tablet 3   SYRINGE-NEEDLE, DISP, 3 ML (B-D 3CC LUER-LOK SYR 18GX1-1/2) 18G X 1-1/2" 3 ML MISC Use as directed with testosterone. 4 each 1   testosterone cypionate (DEPOTESTOSTERONE CYPIONATE) 200 MG/ML injection Inject 0.5 mLs (100 mg total) into the muscle once a week. 10 mL 1   tirzepatide (MOUNJARO) 10 MG/0.5ML Pen Inject 10 mg into the skin once  a week. 6 mL 2   triamcinolone ointment (KENALOG) 0.1 % Apply 1 application topically 2 (two) times daily as needed for rash.     No current facility-administered medications for this visit.    Allergies  Allergen Reactions   Lisinopril Cough    Diagnoses:  Generalized anxiety disorder  Plan of Care: I will meet with the patient's virtually every 2 to 3 weeks   French Ana, [facility]

## 2023-01-13 NOTE — Progress Notes (Signed)
French Ana, [facility]

## 2023-01-29 ENCOUNTER — Ambulatory Visit (INDEPENDENT_AMBULATORY_CARE_PROVIDER_SITE_OTHER): Payer: 59 | Admitting: Behavioral Health

## 2023-01-29 ENCOUNTER — Encounter: Payer: Self-pay | Admitting: Behavioral Health

## 2023-01-29 DIAGNOSIS — F411 Generalized anxiety disorder: Secondary | ICD-10-CM

## 2023-01-29 NOTE — Progress Notes (Signed)
[place] [facility] Counselor/Therapist Progress Note  Patient ID: DERECK AGERTON, MRN: [id number],    Date: 01/29/2023  Time Spent: 49 minutes, 10 AM until 10:49 AM.This session was held via video teletherapy. The patient consented to the video teletherapy and was located in his home during this session. He is aware it is the responsibility of the patient to secure confidentiality on her end of the session. The provider was in a private home office for the duration of this session  Treatment Type: Individual Therapy  Reported Symptoms: stress  Mental Status Exam: Appearance:  Well Groomed     Behavior: Appropriate  Motor: Normal  Speech/Language:  Normal Rate  Affect: Appropriate  Mood: normal  Thought process: normal  Thought content:   normal  Sensory/Perceptual disturbances:   WNL  Orientation: oriented to person, place, time/date, situation, day of week, month of year, and year  Attention: Good  Concentration: Good  Memory: WNL  Fund of knowledge:  Good  Insight:   Good  Judgment:  Good  Impulse Control: Good   Risk Assessment: Danger to Self:  No Self-injurious Behavior: No Danger to Others: No Duty to Warn:no Physical Aggression / Violence:No  Access to Firearms a concern: No  Gang Involvement:No   Subjective: I reviewed the initial session with the patient and established treatment plans which are outlined below.  The biggest stressor for the patient currently is his work situation.  There were various factors including being short handed, working multiple shifts, managing a team etc.  There is not always enough funding for the position although it is a necessity people expect he and his team to be present.  The nature of his work environment has changed especially since COVID and there are bigger and more difficult demands.  He is not opposed to looking at other employment opportunities but in the meantime we started looking at ways that he can cope with his  current situation as well as practicing a healthy work life balance.  In his downtime he enjoys spending time with his wife and his daughter.  We talked about some of the things that they like to do together and the importance of being present with them.  He is familiar with the concept of mindfulness but I introduced a mindfulness exercise which I encouraged him to practice.  We also introduced some anxiety reduction techniques for him to practice.  He has been using the relaxation breathing but began using the grounding exercise after the previous session and reports it to be of benefit.  Interventions: Cognitive Behavioral Therapy  Diagnosis: Generalized anxiety disorder  Plan: I will meet with the patient every 2 to 3 weeks via care agility.  Treatment plan: Goals are to reduce his anxiety/stress level by at least 50% with a target date of August 18, 2023.  Goals will include helping the patient to improve his ability to manage anxiety symptoms and better handle stress especially in the workplace.  Identify causes for anxiety and explore ways to lower it, resolve any core conflicts contributing to causes with anxiety as well as helping him manage thoughts and worrisome thinking contributing to anxiety.  Interventions include providing education about anxiety, facilitate and problem solution skills as well as coping skills to manage anxiety symptoms.  We will also use cognitive behavioral therapy to identify and change anxiety provoking thoughts and behavior patterns as well as dialectical behavior therapy to teach distress tolerance and mindfulness skills.  Progress 25%  French Ana,  [facility]

## 2023-01-29 NOTE — Progress Notes (Signed)
                Michael Guerra Michael Guerra, LCMHC 

## 2023-02-02 ENCOUNTER — Other Ambulatory Visit (HOSPITAL_BASED_OUTPATIENT_CLINIC_OR_DEPARTMENT_OTHER): Payer: Self-pay

## 2023-02-11 ENCOUNTER — Other Ambulatory Visit (HOSPITAL_BASED_OUTPATIENT_CLINIC_OR_DEPARTMENT_OTHER): Payer: Self-pay

## 2023-02-11 MED ORDER — "NEEDLE (DISP) 23G X 1"" MISC"
1.0000 | 1 refills | Status: DC
  Filled 2023-02-11: qty 4, 28d supply, fill #0
  Filled 2023-03-10: qty 4, 28d supply, fill #1

## 2023-02-11 MED ORDER — "SYRINGE/NEEDLE (DISP) 18G X 1-1/2"" 3 ML MISC"
1.0000 | 1 refills | Status: DC
  Filled 2023-02-11: qty 4, 28d supply, fill #0
  Filled 2023-03-10: qty 4, 28d supply, fill #1

## 2023-02-12 ENCOUNTER — Other Ambulatory Visit (HOSPITAL_COMMUNITY): Payer: Self-pay | Admitting: Cardiology

## 2023-02-15 ENCOUNTER — Other Ambulatory Visit: Payer: Self-pay

## 2023-02-15 ENCOUNTER — Other Ambulatory Visit (HOSPITAL_BASED_OUTPATIENT_CLINIC_OR_DEPARTMENT_OTHER): Payer: Self-pay

## 2023-02-15 MED ORDER — CARVEDILOL 25 MG PO TABS
25.0000 mg | ORAL_TABLET | Freq: Two times a day (BID) | ORAL | 0 refills | Status: DC
  Filled 2023-02-15: qty 30, 15d supply, fill #0

## 2023-02-16 ENCOUNTER — Ambulatory Visit (INDEPENDENT_AMBULATORY_CARE_PROVIDER_SITE_OTHER): Payer: 59 | Admitting: Behavioral Health

## 2023-02-16 ENCOUNTER — Encounter: Payer: Self-pay | Admitting: Behavioral Health

## 2023-02-16 DIAGNOSIS — F411 Generalized anxiety disorder: Secondary | ICD-10-CM

## 2023-02-16 NOTE — Progress Notes (Signed)
[place] [facility] Counselor/Therapist Progress Note  Patient ID: Michael Guerra, MRN: [id number],    Date: 02/16/2023  Time Spent: 53 minutes, 1 PM until 1:53 PM.This session was held via video teletherapy. The patient consented to the video teletherapy and was located in his home during this session. He is aware it is the responsibility of the patient to secure confidentiality on her end of the session. The provider was in a private home office for the duration of this session  Treatment Type: Individual Therapy  Reported Symptoms: stress  Mental Status Exam: Appearance:  Well Groomed     Behavior: Appropriate  Motor: Normal  Speech/Language:  Normal Rate  Affect: Appropriate  Mood: normal  Thought process: normal  Thought content:   normal  Sensory/Perceptual disturbances:   WNL  Orientation: oriented to person, place, time/date, situation, day of week, month of year, and year  Attention: Good  Concentration: Good  Memory: WNL  Fund of knowledge:  Good  Insight:   Good  Judgment:  Good  Impulse Control: Good   Risk Assessment: Danger to Self:  No Self-injurious Behavior: No Danger to Others: No Duty to Warn:no Physical Aggression / Violence:No  Access to Firearms a concern: No  Gang Involvement:No   Subjective: We processed more of the patient's work stress including 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  He does communicate very well and directly with him and uses humor to help with the stressful situations of the environment that they work in.  We talked about awareness and acceptance and action of situations that he is at and what he is learning to accept in terms of his work environment.  He has been using the mindfulness especially at work but I encouraged use of mindfulness as well as other coping skills both in and out of work.  We talked about being present and mindful with his wife and daughter which she feels he  does a good job of.  He continues to work out, eat well and enjoyed downtime on his days off.  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  We also looked at the cognitive aspect of how he approaches work and he starts over the positive attitude in the sense of gratitude which he recognizes is good for his mental and emotional health also. Interventions: Cognitive Behavioral Therapy  Diagnosis: Generalized anxiety disorder  Plan: I will meet with the patient every 2 to 3 weeks via care agility.  Treatment plan: Goals are to reduce his anxiety/stress level by at least 50% with a target date of August 18, 2023.  Goals will include helping the patient to improve his ability to manage anxiety symptoms and better handle stress especially in the workplace.  Identify causes for anxiety and explore ways to lower it, resolve any core conflicts contributing to causes with anxiety as well as helping him manage thoughts and worrisome thinking contributing to anxiety.  Interventions include providing education about anxiety, facilitate and problem solution skills as well as coping skills to manage anxiety symptoms.  We will also use cognitive behavioral therapy to identify and change anxiety provoking thoughts and behavior patterns as well as dialectical behavior therapy to teach distress tolerance and mindfulness skills.  Progress 25%  French Ana, [facility]                  French Ana, [facility]

## 2023-02-26 ENCOUNTER — Other Ambulatory Visit (HOSPITAL_BASED_OUTPATIENT_CLINIC_OR_DEPARTMENT_OTHER): Payer: Self-pay

## 2023-02-26 ENCOUNTER — Encounter: Payer: Self-pay | Admitting: Behavioral Health

## 2023-02-26 ENCOUNTER — Ambulatory Visit (INDEPENDENT_AMBULATORY_CARE_PROVIDER_SITE_OTHER): Payer: 59 | Admitting: Behavioral Health

## 2023-02-26 ENCOUNTER — Other Ambulatory Visit (HOSPITAL_COMMUNITY): Payer: Self-pay | Admitting: Cardiology

## 2023-02-26 DIAGNOSIS — F411 Generalized anxiety disorder: Secondary | ICD-10-CM | POA: Diagnosis not present

## 2023-02-26 NOTE — Progress Notes (Signed)
[facility] Counselor/Therapist Progress Note  Patient ID: JALYNN HUTZELL, MRN: [id number],    Date: 02/26/2023  Time Spent: 37 minutes, 1 PM until 1:37 PM.This session was held via video teletherapy. The patient consented to the video teletherapy and was located in his home during this session. He is aware it is the responsibility of the patient to secure confidentiality on her end of the session. The provider was in a private home office for the duration of this session  Treatment Type: Individual Therapy  Reported Symptoms: stress  Mental Status Exam: Appearance:  Well Groomed     Behavior: Appropriate  Motor: Normal  Speech/Language:  Normal Rate  Affect: Appropriate  Mood: normal  Thought process: normal  Thought content:   normal  Sensory/Perceptual disturbances:   WNL  Orientation: oriented to person, place, time/date, situation, day of week, month of year, and year  Attention: Good  Concentration: Good  Memory: WNL  Fund of knowledge:  Good  Insight:   Good  Judgment:  Good  Impulse Control: Good   Risk Assessment: Danger to Self:  No Self-injurious Behavior: No Danger to Others: No Duty to Warn:no Physical Aggression / Violence:No  Access to Firearms a concern: No  Gang Involvement:No   Subjective: For the past few weeks the patient says things have been fairly routine and normal.  With the exception of 1 incident at work things are going fairly smoothly.  He feels he handled that well with his team fairly well and involved a lot out activation, documentation etc.  It was resolved smoothly.  His plan is within the next 1 to 2 years to be able to retire.  His wife controls business and so they have to work out insurance.  He knows that even doing something part-time after retirement is something he wants to do but sees it as reducing his stress level significantly.  Otherwise he is doing things well.  He is using coping skills.  He is practicing  gratitude.  He is resting well.  He is spending quality time with his wife and his daughter.  He feels that as he is doing fairly well and can spread appointments out so we will meet again in about 5 weeks.  Interventions: Cognitive Behavioral Therapy  Diagnosis: Generalized anxiety disorder  Plan: I will meet with the patient every 2 to 3 weeks via care agility.  Treatment plan: Goals are to reduce his anxiety/stress level by at least 50% with a target date of August 18, 2023.  Goals will include helping the patient to improve his ability to manage anxiety symptoms and better handle stress especially in the workplace.  Identify causes for anxiety and explore ways to lower it, resolve any core conflicts contributing to causes with anxiety as well as helping him manage thoughts and worrisome thinking contributing to anxiety.  Interventions include providing education about anxiety, facilitate and problem solution skills as well as coping skills to manage anxiety symptoms.  We will also use cognitive behavioral therapy to identify and change anxiety provoking thoughts and behavior patterns as well as dialectical behavior therapy to teach distress tolerance and mindfulness skills.  Progress 35%  French Ana, [facility]                  French Ana, [facility]               French Ana, [facility]

## 2023-03-01 ENCOUNTER — Other Ambulatory Visit (HOSPITAL_BASED_OUTPATIENT_CLINIC_OR_DEPARTMENT_OTHER): Payer: Self-pay

## 2023-03-01 MED ORDER — ENTRESTO 97-103 MG PO TABS
1.0000 | ORAL_TABLET | Freq: Two times a day (BID) | ORAL | 11 refills | Status: DC
  Filled 2023-03-01: qty 60, 30d supply, fill #0
  Filled 2023-04-12: qty 60, 30d supply, fill #1
  Filled 2023-06-18: qty 60, 30d supply, fill #2
  Filled 2023-07-15: qty 60, 30d supply, fill #3
  Filled 2023-08-30: qty 60, 30d supply, fill #4
  Filled 2023-10-07: qty 60, 30d supply, fill #5
  Filled 2023-10-21 – 2023-11-01 (×3): qty 60, 30d supply, fill #6
  Filled 2023-12-21: qty 60, 30d supply, fill #7
  Filled 2024-02-04: qty 60, 30d supply, fill #8

## 2023-03-08 ENCOUNTER — Encounter: Payer: Self-pay | Admitting: Gastroenterology

## 2023-03-10 ENCOUNTER — Other Ambulatory Visit (HOSPITAL_BASED_OUTPATIENT_CLINIC_OR_DEPARTMENT_OTHER): Payer: Self-pay

## 2023-03-12 ENCOUNTER — Other Ambulatory Visit (HOSPITAL_COMMUNITY): Payer: Self-pay | Admitting: Cardiology

## 2023-03-12 ENCOUNTER — Other Ambulatory Visit (HOSPITAL_BASED_OUTPATIENT_CLINIC_OR_DEPARTMENT_OTHER): Payer: Self-pay

## 2023-03-15 ENCOUNTER — Other Ambulatory Visit (HOSPITAL_BASED_OUTPATIENT_CLINIC_OR_DEPARTMENT_OTHER): Payer: Self-pay

## 2023-03-16 ENCOUNTER — Other Ambulatory Visit (HOSPITAL_BASED_OUTPATIENT_CLINIC_OR_DEPARTMENT_OTHER): Payer: Self-pay

## 2023-03-16 MED ORDER — CARVEDILOL 25 MG PO TABS
25.0000 mg | ORAL_TABLET | Freq: Two times a day (BID) | ORAL | 0 refills | Status: DC
  Filled 2023-03-16: qty 180, 90d supply, fill #0

## 2023-04-09 ENCOUNTER — Encounter: Payer: Self-pay | Admitting: Behavioral Health

## 2023-04-09 ENCOUNTER — Ambulatory Visit: Payer: 59 | Admitting: Behavioral Health

## 2023-04-09 DIAGNOSIS — F411 Generalized anxiety disorder: Secondary | ICD-10-CM | POA: Diagnosis not present

## 2023-04-09 NOTE — Progress Notes (Signed)
[facility] Counselor/Therapist Progress Note  Patient ID: Michael Guerra, MRN: [id number],    Date: 04/09/2023  Time Spent: 48 minutes, 10 AM until 10:48 AM PM.This session was held via video teletherapy. The patient consented to the video teletherapy and was located in his home during this session. He is aware it is the responsibility of the patient to secure confidentiality on her end of the session. The provider was in a private home office for the duration of this session  Treatment Type: Individual Therapy  Reported Symptoms: stress  Mental Status Exam: Appearance:  Well Groomed     Behavior: Appropriate  Motor: Normal  Speech/Language:  Normal Rate  Affect: Appropriate  Mood: normal  Thought process: normal  Thought content:   normal  Sensory/Perceptual disturbances:   WNL  Orientation: oriented to person, place, time/date, situation, day of week, month of year, and year  Attention: Good  Concentration: Good  Memory: WNL  Fund of knowledge:  Good  Insight:   Good  Judgment:  Good  Impulse Control: Good   Risk Assessment: Danger to Self:  No Self-injurious Behavior: No Danger to Others: No Duty to Warn:no Physical Aggression / Violence:No  Access to Firearms a concern: No  Gang Involvement:No   Subjective: The patient is looking forward to the holidays.  His 9-year-old daughter is very excited and he enjoys that too.  Both he and his wife were very busy with work but he feels they are finding a pretty good balance of time in life.  There is still some stress at work which is inherent to his job and setting that he is him but he feels that he is managing it well.  He continues to do those things which he knows are beneficial.  He reports that using grounding exercises, breathing and vagus nerve stimulation coping skills are very beneficial.  We took a look at positive self-talk and cognitive reframing and how that can be of benefit in reducing anxiety and  stress.  We also talked about the importance of gratitude.  The patient is very thankful for a job that he has an family that he cares for and supports him well. He feels that as he is doing fairly well and can spread appointments out so we will meet again in about 5 weeks.  Interventions: Cognitive Behavioral Therapy  Diagnosis: Generalized anxiety disorder  Plan: I will meet with the patient every 2 to 3 weeks via care agility.  Treatment plan: Goals are to reduce his anxiety/stress level by at least 50% with a target date of August 18, 2023.  Goals will include helping the patient to improve his ability to manage anxiety symptoms and better handle stress especially in the workplace.  Identify causes for anxiety and explore ways to lower it, resolve any core conflicts contributing to causes with anxiety as well as helping him manage thoughts and worrisome thinking contributing to anxiety.  Interventions include providing education about anxiety, facilitate and problem solution skills as well as coping skills to manage anxiety symptoms.  We will also use cognitive behavioral therapy to identify and change anxiety provoking thoughts and behavior patterns as well as dialectical behavior therapy to teach distress tolerance and mindfulness skills.  Progress 35%  French Ana, [facility]                  French Ana, [facility]               Albertha Ghee  Noe Gens [facility]               French Ana, [facility]

## 2023-04-12 ENCOUNTER — Other Ambulatory Visit (HOSPITAL_COMMUNITY): Payer: Self-pay

## 2023-04-13 ENCOUNTER — Other Ambulatory Visit (HOSPITAL_BASED_OUTPATIENT_CLINIC_OR_DEPARTMENT_OTHER): Payer: Self-pay

## 2023-04-15 ENCOUNTER — Other Ambulatory Visit: Payer: Self-pay

## 2023-04-15 ENCOUNTER — Other Ambulatory Visit (HOSPITAL_BASED_OUTPATIENT_CLINIC_OR_DEPARTMENT_OTHER): Payer: Self-pay

## 2023-04-15 ENCOUNTER — Other Ambulatory Visit: Payer: Self-pay | Admitting: Family Medicine

## 2023-04-15 MED ORDER — "BD DISP NEEDLE 23G X 1"" MISC"
1.0000 | 1 refills | Status: DC
  Filled 2023-04-15: qty 4, 28d supply, fill #0
  Filled 2023-05-31: qty 4, 28d supply, fill #1

## 2023-04-15 MED ORDER — "SYRINGE/NEEDLE (DISP) 18G X 1-1/2"" 3 ML MISC"
1.0000 | 1 refills | Status: DC
  Filled 2023-04-15: qty 4, 28d supply, fill #0
  Filled 2023-05-31: qty 4, 28d supply, fill #1

## 2023-05-04 ENCOUNTER — Other Ambulatory Visit (HOSPITAL_COMMUNITY): Payer: Self-pay

## 2023-05-05 DIAGNOSIS — N1831 Chronic kidney disease, stage 3a: Secondary | ICD-10-CM | POA: Diagnosis not present

## 2023-05-12 ENCOUNTER — Encounter: Payer: Self-pay | Admitting: Behavioral Health

## 2023-05-12 ENCOUNTER — Ambulatory Visit: Payer: Commercial Managed Care - PPO | Admitting: Behavioral Health

## 2023-05-12 DIAGNOSIS — N1831 Chronic kidney disease, stage 3a: Secondary | ICD-10-CM | POA: Diagnosis not present

## 2023-05-12 DIAGNOSIS — F411 Generalized anxiety disorder: Secondary | ICD-10-CM

## 2023-05-12 DIAGNOSIS — I129 Hypertensive chronic kidney disease with stage 1 through stage 4 chronic kidney disease, or unspecified chronic kidney disease: Secondary | ICD-10-CM | POA: Diagnosis not present

## 2023-05-12 NOTE — Progress Notes (Signed)
[facility] Counselor/Therapist Progress Note  Patient ID: Michael Guerra, MRN: [id number],    Date: 05/12/2023  Time Spent: 28 minutes, 2 PM until 2:28 PM.  This session was held via video teletherapy. The patient consented to the video teletherapy and was located in his home during this session. He is aware it is the responsibility of the patient to secure confidentiality on her end of the session. The provider was in a private home office for the duration of this session  Treatment Type: Individual Therapy  Reported Symptoms: stress  Mental Status Exam: Appearance:  Well Groomed     Behavior: Appropriate  Motor: Normal  Speech/Language:  Normal Rate  Affect: Appropriate  Mood: normal  Thought process: normal  Thought content:   normal  Sensory/Perceptual disturbances:   WNL  Orientation: oriented to person, place, time/date, situation, day of week, month of year, and year  Attention: Good  Concentration: Good  Memory: WNL  Fund of knowledge:  Good  Insight:   Good  Judgment:  Good  Impulse Control: Good   Risk Assessment: Danger to Self:  No Self-injurious Behavior: No Danger to Others: No Duty to Warn:no Physical Aggression / Violence:No  Access to Firearms a concern: No  Gang Involvement:No   Subjective: The patient that his family had a good holiday.  He was able to have some time off and spent it with his wife and daughter as well as his extended family.  He is in a support role with something his brother is going through but he feels that he is handling that well.  For the most part things have been reasonably quiet at work and he is thankful for that but he knows that there could be various situations which can be challenging mentally emotionally and physically.  He is still practicing good self-care in and outside of work.  He also is somewhat looking at other opportunities within the system that he works in to see if there might be something that had a  little bit more structured schedule than the one he currently has.  His daughter was sick after Christmas and it went from the flu to pneumonia and so she had to have a little higher care of all of which made his wife had to take some time off.  That helps drive is looking for something a little more consistent.  Otherwise he is managing his anxiety and stress level well and practicing good coping skills.   He feels that as he is doing fairly well and can spread appointments out so we will meet again in about 5 weeks.  Interventions: Cognitive Behavioral Therapy  Diagnosis: Generalized anxiety disorder  Plan: I will meet with the patient every 2 to 3 weeks via care agility.  Treatment plan: Goals are to reduce his anxiety/stress level by at least 50% with a target date of August 18, 2023.  Goals will include helping the patient to improve his ability to manage anxiety symptoms and better handle stress especially in the workplace.  Identify causes for anxiety and explore ways to lower it, resolve any core conflicts contributing to causes with anxiety as well as helping him manage thoughts and worrisome thinking contributing to anxiety.  Interventions include providing education about anxiety, facilitate and problem solution skills as well as coping skills to manage anxiety symptoms.  We will also use cognitive behavioral therapy to identify and change anxiety provoking thoughts and behavior patterns as well as dialectical behavior therapy to  teach distress tolerance and mindfulness skills.  Progress 35%  French Ana, [facility]                  French Ana, [facility]               French Ana, [facility]               French Ana, [facility]               French Ana, [facility]

## 2023-05-24 ENCOUNTER — Other Ambulatory Visit (HOSPITAL_BASED_OUTPATIENT_CLINIC_OR_DEPARTMENT_OTHER): Payer: Self-pay

## 2023-05-24 ENCOUNTER — Encounter: Payer: 59 | Admitting: Family Medicine

## 2023-05-26 ENCOUNTER — Other Ambulatory Visit (HOSPITAL_BASED_OUTPATIENT_CLINIC_OR_DEPARTMENT_OTHER): Payer: Self-pay

## 2023-05-31 ENCOUNTER — Other Ambulatory Visit (HOSPITAL_BASED_OUTPATIENT_CLINIC_OR_DEPARTMENT_OTHER): Payer: Self-pay

## 2023-06-03 ENCOUNTER — Ambulatory Visit (INDEPENDENT_AMBULATORY_CARE_PROVIDER_SITE_OTHER): Payer: Commercial Managed Care - PPO | Admitting: Behavioral Health

## 2023-06-03 ENCOUNTER — Encounter: Payer: Self-pay | Admitting: Behavioral Health

## 2023-06-03 DIAGNOSIS — F411 Generalized anxiety disorder: Secondary | ICD-10-CM | POA: Diagnosis not present

## 2023-06-03 NOTE — Progress Notes (Signed)
[facility] Counselor/Therapist Progress Note  Patient ID: CHASETON YEPIZ, MRN: [id number],    Date: 06/03/2023  Time Spent: 50 minutes, 1 PM until 1:50 PM.  This session was held via video teletherapy. The patient consented to the video teletherapy and was located in his home during this session. He is aware it is the responsibility of the patient to secure confidentiality on her end of the session. The provider was in a private home office for the duration of this session  Treatment Type: Individual Therapy  Reported Symptoms: stress  Mental Status Exam: Appearance:  Well Groomed     Behavior: Appropriate  Motor: Normal  Speech/Language:  Normal Rate  Affect: Appropriate  Mood: normal  Thought process: normal  Thought content:   normal  Sensory/Perceptual disturbances:   WNL  Orientation: oriented to person, place, time/date, situation, day of week, month of year, and year  Attention: Good  Concentration: Good  Memory: WNL  Fund of knowledge:  Good  Insight:   Good  Judgment:  Good  Impulse Control: Good   Risk Assessment: Danger to Self:  No Self-injurious Behavior: No Danger to Others: No Duty to Warn:no Physical Aggression / Violence:No  Access to Firearms a concern: No  Gang Involvement:No   Subjective: 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  He is already fairly short staffed but now has a staff who is not performing up to expectations and they are looking at ways to make changes but that is not happening very quickly.  He is very thankful for the coping skills that he uses regularly and we introduced a couple of others including some sensory coping skills especially olfactory and/or tactile which might be helpful to the patient.  I reminded him of the importance of practicing mindfulness, noticing his thoughts  especially in stressful situations at work.  He says that his daughter is at that age where everything is no so he and his wife are practicing mindfulness in helping shape his daughter and learning with her as she grows.  He also is helping his brother out some but says that is not overly stressful.  He feels that he is doing what he can at work to reduce stress and enjoying his days off his ways of stress reduction and being productive.  It Otherwise he is managing his anxiety and stress level well and practicing good coping skills.   He feels that as he is doing fairly well and can spread appointments out so we will meet again in about 2-3 weeks.  Interventions: Cognitive Behavioral Therapy  Diagnosis: Generalized anxiety disorder  Plan: I will meet with the patient every 2 to 3 weeks via care agility.  Treatment plan: Goals are to reduce his anxiety/stress level by at least 50% with a target date of August 18, 2023.  Goals will include helping the patient to improve his ability to manage anxiety symptoms and better handle stress especially in the workplace.  Identify causes for anxiety and explore ways to lower it, resolve any core conflicts contributing to causes with anxiety as well as helping him manage thoughts and worrisome thinking contributing to anxiety.  Interventions include providing education about anxiety, facilitate and problem solution skills as well as coping skills to manage anxiety symptoms.  We will also use cognitive behavioral therapy to identify and change anxiety provoking thoughts and behavior patterns as  well as dialectical behavior therapy to teach distress tolerance and mindfulness skills.  Progress 35%  French Ana, [facility]                  French Ana, [facility]               French Ana, [facility]               French Ana, [facility]               French Ana, [facility]               French Ana, [facility]

## 2023-06-04 ENCOUNTER — Ambulatory Visit: Payer: Commercial Managed Care - PPO | Admitting: Family Medicine

## 2023-06-04 ENCOUNTER — Other Ambulatory Visit (HOSPITAL_BASED_OUTPATIENT_CLINIC_OR_DEPARTMENT_OTHER): Payer: Self-pay

## 2023-06-04 ENCOUNTER — Encounter: Payer: Self-pay | Admitting: Family Medicine

## 2023-06-04 VITALS — BP 110/68 | HR 80 | Temp 98.0°F | Resp 16 | Ht 73.0 in | Wt 196.0 lb

## 2023-06-04 DIAGNOSIS — E1165 Type 2 diabetes mellitus with hyperglycemia: Secondary | ICD-10-CM | POA: Diagnosis not present

## 2023-06-04 DIAGNOSIS — Z125 Encounter for screening for malignant neoplasm of prostate: Secondary | ICD-10-CM

## 2023-06-04 DIAGNOSIS — Z0000 Encounter for general adult medical examination without abnormal findings: Secondary | ICD-10-CM | POA: Diagnosis not present

## 2023-06-04 DIAGNOSIS — Z7985 Long-term (current) use of injectable non-insulin antidiabetic drugs: Secondary | ICD-10-CM

## 2023-06-04 LAB — CBC
HCT: 51 % (ref 39.0–52.0)
Hemoglobin: 16.8 g/dL (ref 13.0–17.0)
MCHC: 33 g/dL (ref 30.0–36.0)
MCV: 91.9 fL (ref 78.0–100.0)
Platelets: 202 10*3/uL (ref 150.0–400.0)
RBC: 5.55 Mil/uL (ref 4.22–5.81)
RDW: 13.4 % (ref 11.5–15.5)
WBC: 2.8 10*3/uL — ABNORMAL LOW (ref 4.0–10.5)

## 2023-06-04 LAB — LIPID PANEL
Cholesterol: 122 mg/dL (ref 0–200)
HDL: 46.5 mg/dL (ref >39.00)
LDL Cholesterol: 60 mg/dL (ref 0–99)
NonHDL: 75.62
Total CHOL/HDL Ratio: 3
Triglycerides: 76 mg/dL (ref 0.0–149.0)
VLDL: 15.2 mg/dL (ref 0.0–40.0)

## 2023-06-04 LAB — COMPREHENSIVE METABOLIC PANEL
ALT: 38 U/L (ref 0–53)
AST: 44 U/L — ABNORMAL HIGH (ref 0–37)
Albumin: 4.7 g/dL (ref 3.5–5.2)
Alkaline Phosphatase: 63 U/L (ref 39–117)
BUN: 20 mg/dL (ref 6–23)
CO2: 32 meq/L (ref 19–32)
Calcium: 9.7 mg/dL (ref 8.4–10.5)
Chloride: 102 meq/L (ref 96–112)
Creatinine, Ser: 1.45 mg/dL (ref 0.40–1.50)
GFR: 50.73 mL/min — ABNORMAL LOW (ref >60.00)
Glucose, Bld: 87 mg/dL (ref 70–99)
Potassium: 4.7 meq/L (ref 3.5–5.1)
Sodium: 139 meq/L (ref 135–145)
Total Bilirubin: 2.4 mg/dL — ABNORMAL HIGH (ref 0.2–1.2)
Total Protein: 7.7 g/dL (ref 6.0–8.3)

## 2023-06-04 LAB — HEMOGLOBIN A1C: Hgb A1c MFr Bld: 6.4 % (ref 4.6–6.5)

## 2023-06-04 LAB — PSA: PSA: 0.59 ng/mL (ref 0.10–4.00)

## 2023-06-04 LAB — MICROALBUMIN / CREATININE URINE RATIO
Creatinine,U: 74.6 mg/dL
Microalb Creat Ratio: 9.4 mg/g (ref 0.0–30.0)
Microalb, Ur: <0.7 mg/dL (ref 0.0–1.9)

## 2023-06-04 MED ORDER — FLUTICASONE PROPIONATE 50 MCG/ACT NA SUSP
2.0000 | Freq: Every day | NASAL | 6 refills | Status: AC
  Filled 2023-06-04: qty 16, 30d supply, fill #0

## 2023-06-04 NOTE — Progress Notes (Signed)
Chief Complaint  Patient presents with   Annual Exam    Annual Exam    Well Male Michael Guerra is here for a complete physical.   His last physical was >1 year ago.  Current diet: in general, a "healthy" diet.   Current exercise: walking, lifting wts Weight trend: stable Fatigue out of ordinary? No. Seat belt? Yes.   Advanced directive? No  Health maintenance Shingrix- No Colonoscopy- Due Tetanus- Yes Hep C- Yes Pneumonia vaccine- Yes  Past Medical History:  Diagnosis Date   CAD (coronary artery disease) 06/08/2019   Cardiomyopathy (HCC) 05/23/2019   Cataract    Chest discomfort 05/12/2019   CHF (congestive heart failure) (HCC)    Chronic kidney disease    one kidney that is healthy   Chronic left shoulder pain 02/05/2020   Diabetes mellitus due to underlying condition with unspecified complications (HCC) 05/12/2019   Essential hypertension 05/12/2019   Foot sprain, left, initial encounter 06/22/2019   Hyperlipidemia 02/17/2006   Qualifier: Diagnosis of  By: Maple Hudson MD, Riki Rusk     LIVER FUNCTION TESTS, ABNORMAL 05/21/2006   Qualifier: Diagnosis of  By: Maple Hudson MD, Riki Rusk     Mixed dyslipidemia 05/12/2019   Other specified abnormal findings of blood chemistry 05/20/2011   Formatting of this note might be different from the original. Elevated liver function tests 10/1 IMO update   Rectal polyp 05/20/2011   Formatting of this note might be different from the original. Noted march 2011 fu in march 2016   Renal insufficiency 05/23/2019   Skin lesion of chest wall 05/20/2011   Formatting of this note might be different from the original. Mid chest (prob seb/epidermoid cyst)   Solitary kidney    Type 2 diabetes mellitus with hyperglycemia, without long-term current use of insulin (HCC) 02/17/2006   Qualifier: Diagnosis of  By: Maple Hudson MD, Riki Rusk       Past Surgical History:  Procedure Laterality Date   ABDOMINAL SURGERY     KIDNEY SURGERY     bowel obstruction  x 3   LEFT HEART CATH AND  CORONARY ANGIOGRAPHY N/A 05/31/2019   Procedure: LEFT HEART CATH AND CORONARY ANGIOGRAPHY;  Surgeon: Iran Ouch, MD;  Location: [facility] INVASIVE [facility] LAB;  Service: Cardiovascular;  Laterality: N/A;    Medications  Current Outpatient Medications on File Prior to Visit  Medication Sig Dispense Refill   acetaminophen (TYLENOL) 500 MG tablet Take 1,000 mg by mouth every 6 (six) hours as needed for moderate pain or headache.     aspirin 81 MG tablet Take 81 mg by mouth daily.     atorvastatin (LIPITOR) 40 MG tablet Take 1 tablet (40 mg total) by mouth daily. 90 tablet 3   Blood Glucose Monitoring Suppl (FREESTYLE LITE) w/Device KIT Use daily to check blood sugar 1 kit 0   carvedilol (COREG) 25 MG tablet Take 1 tablet (25 mg total) by mouth 2 (two) times daily with a meal. 180 tablet 0   dapagliflozin propanediol (FARXIGA) 10 MG TABS tablet Take 1 tablet (10 mg total) by mouth daily. 90 tablet 3   glucose blood (FREESTYLE LITE) test strip Use daily to check blood sugar 100 each 3   ipratropium (ATROVENT) 0.03 % nasal spray Place 2 sprays into both nostrils every 12 (twelve) hours. 30 mL 12   Lactobacillus (PROBIOTIC ACIDOPHILUS PO) Take 1 tablet by mouth daily in the afternoon.     Lancets (FREESTYLE) lancets Use to check blood sugars daily 100 each 3  Melatonin 10 MG CAPS Take 30 mg by mouth at bedtime as needed for sleep (sleep).     Multiple Vitamins-Minerals (MULTIVITAMIN ADULT) TABS Take 1 tablet by mouth daily with lunch.      NEEDLE, DISP, 22 G (EASY TOUCH HYPODERMIC NEEDLE) 22G X 1-1/2" MISC Use as directed with testosterone. 4 each 1   NEEDLE, DISP, 23 G (BD DISP NEEDLE) 23G X 1" MISC Use as directed with testosterone. 4 each 1   Omega-3 Fatty Acids (FISH OIL) 1000 MG CAPS Take 1,000 mg by mouth daily.     sacubitril-valsartan (ENTRESTO) 97-103 MG Take 1 tablet by mouth 2 (two) times daily. 60 tablet 11   spironolactone (ALDACTONE) 25 MG tablet Take 1/2 tablet (12.5 mg total) by mouth  daily. 30 tablet 3   SYRINGE-NEEDLE, DISP, 3 ML (B-D 3CC LUER-LOK SYR 18GX1-1/2) 18G X 1-1/2" 3 ML MISC Use as directed with testosterone. 4 each 1   testosterone cypionate (DEPOTESTOSTERONE CYPIONATE) 200 MG/ML injection Inject 0.5 mLs (100 mg total) into the muscle once a week. 10 mL 1   tirzepatide (MOUNJARO) 10 MG/0.5ML Pen Inject 10 mg into the skin once a week. 6 mL 2   triamcinolone ointment (KENALOG) 0.1 % Apply 1 application topically 2 (two) times daily as needed for rash.      Allergies Allergies  Allergen Reactions   Lisinopril Cough    Family History Family History  Problem Relation Age of Onset   Cancer Neg Hx    Colon cancer Neg Hx    Esophageal cancer Neg Hx    Rectal cancer Neg Hx    Stomach cancer Neg Hx     Review of Systems: Constitutional:  no fevers Eye:  no recent significant change in vision Ears:  No changes in hearing Nose/Mouth/Throat:  no complaints of nasal congestion, no sore throat Cardiovascular: no chest pain Respiratory:  No shortness of breath Gastrointestinal:  No change in bowel habits GU:  No frequency Integumentary:  no abnormal skin lesions reported Neurologic:  no headaches Endocrine:  denies unexplained weight changes  Exam BP 110/68 (BP Location: Left Arm, Patient Position: Sitting)   Pulse 80   Temp 98 F (36.7 C) (Oral)   Resp 16   Ht 6\' 1"  (1.854 m)   Wt 196 lb (88.9 kg)   SpO2 96%   BMI 25.86 kg/m  General:  well developed, well nourished, in no apparent distress Skin:  no significant moles, warts, or growths Head:  no masses, lesions, or tenderness Eyes:  pupils equal and round, sclera anicteric without injection Ears:  canals without lesions, TMs shiny without retraction, no obvious effusion, no erythema Nose:  nares patent, mucosa normal Throat/Pharynx:  lips and gingiva without lesion; tongue and uvula midline; non-inflamed pharynx; no exudates or postnasal drainage Lungs:  clear to auscultation, breath sounds  equal bilaterally, no respiratory distress Cardio:  regular rate and rhythm, no LE edema or bruits Rectal: Deferred GI: BS+, S, NT, ND, no masses or organomegaly Musculoskeletal:  symmetrical muscle groups noted without atrophy or deformity Neuro:  gait normal; deep tendon reflexes normal and symmetric Psych: well oriented with normal range of affect and appropriate judgment/insight  Assessment and Plan  Well adult exam - Plan: CBC, Comprehensive metabolic panel, Lipid panel  Type 2 diabetes mellitus with hyperglycemia, without long-term current use of insulin (HCC) - Plan: Hemoglobin A1c, Microalbumin / creatinine urine ratio  Screening for prostate cancer - Plan: PSA   Well 63 y.o. male. Counseled on  diet and exercise. Advanced directive form provided today.  GI has reached out to him, he is encouraged to sched.  Other orders as above. Follow up in 6 mo.  The patient voiced understanding and agreement to the plan.  [name] Grinnell, DO 06/04/23 9:55 AM

## 2023-06-04 NOTE — Patient Instructions (Addendum)
Give [place] 2-3 business days to get the results of your labs back.   Keep the diet clean and stay active.  Please get me a copy of your advanced directive form at your convenience.   Please schedule your colonoscopy.   Let us know if you need anything.

## 2023-06-07 DIAGNOSIS — Z7189 Other specified counseling: Secondary | ICD-10-CM | POA: Diagnosis not present

## 2023-06-18 ENCOUNTER — Other Ambulatory Visit (HOSPITAL_COMMUNITY): Payer: Self-pay

## 2023-06-18 ENCOUNTER — Other Ambulatory Visit: Payer: Self-pay

## 2023-06-22 ENCOUNTER — Other Ambulatory Visit (HOSPITAL_BASED_OUTPATIENT_CLINIC_OR_DEPARTMENT_OTHER): Payer: Self-pay

## 2023-06-22 ENCOUNTER — Telehealth: Admitting: Physician Assistant

## 2023-06-22 DIAGNOSIS — B9689 Other specified bacterial agents as the cause of diseases classified elsewhere: Secondary | ICD-10-CM

## 2023-06-22 DIAGNOSIS — J208 Acute bronchitis due to other specified organisms: Secondary | ICD-10-CM

## 2023-06-22 MED ORDER — DOXYCYCLINE HYCLATE 100 MG PO TABS
100.0000 mg | ORAL_TABLET | Freq: Two times a day (BID) | ORAL | 0 refills | Status: DC
Start: 2023-06-22 — End: 2023-08-05
  Filled 2023-06-22: qty 14, 7d supply, fill #0

## 2023-06-22 MED ORDER — BENZONATATE 100 MG PO CAPS
100.0000 mg | ORAL_CAPSULE | Freq: Three times a day (TID) | ORAL | 0 refills | Status: DC | PRN
Start: 2023-06-22 — End: 2023-12-03
  Filled 2023-06-22: qty 30, 10d supply, fill #0

## 2023-06-22 NOTE — Progress Notes (Signed)
 Virtual Visit Consent   Michael Guerra, you are scheduled for a virtual visit with a [facility] provider today. Just as with appointments in the office, your consent must be obtained to participate. Your consent will be active for this visit and any virtual visit you may have with one of our providers in the next 365 days. If you have a MyChart account, a copy of this consent can be sent to you electronically.  As this is a virtual visit, video technology does not allow for your provider to perform a traditional examination. This may limit your provider's ability to fully assess your condition. If your provider identifies any concerns that need to be evaluated in person or the need to arrange testing (such as labs, EKG, etc.), we will make arrangements to do so. Although advances in technology are sophisticated, we cannot ensure that it will always work on either your end or our end. If the connection with a video visit is poor, the visit may have to be switched to a telephone visit. With either a video or telephone visit, we are not always able to ensure that we have a secure connection.  By engaging in this virtual visit, you consent to the provision of healthcare and authorize for your insurance to be billed (if applicable) for the services provided during this visit. Depending on your insurance coverage, you may receive a charge related to this service.  I need to obtain your verbal consent now. Are you willing to proceed with your visit today? Michael Guerra has provided verbal consent on 06/22/2023 for a virtual visit (video or telephone). Piedad Climes, [place]  Date: 06/22/2023 10:38 AM   Virtual Visit via Video Note   I, Piedad Climes, connected with  Michael Guerra  ([id number][birth year]) on 06/22/23 at 10:30 AM EST by a video-enabled telemedicine application and verified that I am speaking with the correct person using two identifiers.  Location: Patient: Virtual Visit Location  Patient: Home Provider: Virtual Visit Location Provider: Home Office   I discussed the limitations of evaluation and management by telemedicine and the availability of in person appointments. The patient expressed understanding and agreed to proceed.    History of Present Illness: Michael Guerra is a 63 y.o. who identifies as a male who was assigned male at birth, and is being seen today for chest congestion and productive cough over the past 3 weeks. Also started out with nasal congestion and rhinorrhea for which he has taken Flonase with improvement. The chest congestion has continued and is progressing. Denies fever, chills. Mucous is thick and light green.  Does have history of CHF, taking medications as directed.    HPI: HPI  Problems:  Patient Active Problem List   Diagnosis Date Noted   CHF (congestive heart failure) (HCC)    Cataract    Chronic kidney disease    Chronic left shoulder pain 02/05/2020   Foot sprain, left, initial encounter 06/22/2019   CAD (coronary artery disease) 06/08/2019   Cardiomyopathy (HCC) 05/23/2019   Solitary kidney 05/23/2019   Renal insufficiency 05/23/2019   Essential hypertension 05/12/2019   Mixed dyslipidemia 05/12/2019   Diabetes mellitus due to underlying condition with unspecified complications (HCC) 05/12/2019   Chest discomfort 05/12/2019   Rectal polyp 05/20/2011   Skin lesion of chest wall 05/20/2011   Other specified abnormal findings of blood chemistry 05/20/2011   LIVER FUNCTION TESTS, ABNORMAL 05/21/2006   Type 2 diabetes mellitus with hyperglycemia, without  long-term current use of insulin (HCC) 02/17/2006   Hyperlipidemia 02/17/2006    Allergies:  Allergies  Allergen Reactions   Lisinopril Cough   Medications:  Current Outpatient Medications:    benzonatate (TESSALON) 100 MG capsule, Take 1 capsule (100 mg total) by mouth 3 (three) times daily as needed for cough., Disp: 30 capsule, Rfl: 0   doxycycline (VIBRA-TABS) 100 MG  tablet, Take 1 tablet (100 mg total) by mouth 2 (two) times daily., Disp: 14 tablet, Rfl: 0   acetaminophen (TYLENOL) 500 MG tablet, Take 1,000 mg by mouth every 6 (six) hours as needed for moderate pain or headache., Disp: , Rfl:    aspirin 81 MG tablet, Take 81 mg by mouth daily., Disp: , Rfl:    atorvastatin (LIPITOR) 40 MG tablet, Take 1 tablet (40 mg total) by mouth daily., Disp: 90 tablet, Rfl: 3   Blood Glucose Monitoring Suppl (FREESTYLE LITE) w/Device KIT, Use daily to check blood sugar, Disp: 1 kit, Rfl: 0   carvedilol (COREG) 25 MG tablet, Take 1 tablet (25 mg total) by mouth 2 (two) times daily with a meal., Disp: 180 tablet, Rfl: 0   dapagliflozin propanediol (FARXIGA) 10 MG TABS tablet, Take 1 tablet (10 mg total) by mouth daily., Disp: 90 tablet, Rfl: 3   fluticasone (FLONASE) 50 MCG/ACT nasal spray, Place 2 sprays into both nostrils daily., Disp: 16 g, Rfl: 6   glucose blood (FREESTYLE LITE) test strip, Use daily to check blood sugar, Disp: 100 each, Rfl: 3   ipratropium (ATROVENT) 0.03 % nasal spray, Place 2 sprays into both nostrils every 12 (twelve) hours., Disp: 30 mL, Rfl: 12   Lactobacillus (PROBIOTIC ACIDOPHILUS PO), Take 1 tablet by mouth daily in the afternoon., Disp: , Rfl:    Lancets (FREESTYLE) lancets, Use to check blood sugars daily, Disp: 100 each, Rfl: 3   Melatonin 10 MG CAPS, Take 30 mg by mouth at bedtime as needed for sleep (sleep)., Disp: , Rfl:    Multiple Vitamins-Minerals (MULTIVITAMIN ADULT) TABS, Take 1 tablet by mouth daily with lunch. , Disp: , Rfl:    NEEDLE, DISP, 22 G (EASY TOUCH HYPODERMIC NEEDLE) 22G X 1-1/2" MISC, Use as directed with testosterone., Disp: 4 each, Rfl: 1   NEEDLE, DISP, 23 G (BD DISP NEEDLE) 23G X 1" MISC, Use as directed with testosterone., Disp: 4 each, Rfl: 1   Omega-3 Fatty Acids (FISH OIL) 1000 MG CAPS, Take 1,000 mg by mouth daily., Disp: , Rfl:    sacubitril-valsartan (ENTRESTO) 97-103 MG, Take 1 tablet by mouth 2 (two) times  daily., Disp: 60 tablet, Rfl: 11   spironolactone (ALDACTONE) 25 MG tablet, Take 1/2 tablet (12.5 mg total) by mouth daily., Disp: 30 tablet, Rfl: 3   SYRINGE-NEEDLE, DISP, 3 ML (B-D 3CC LUER-LOK SYR 18GX1-1/2) 18G X 1-1/2" 3 ML MISC, Use as directed with testosterone., Disp: 4 each, Rfl: 1   testosterone cypionate (DEPOTESTOSTERONE CYPIONATE) 200 MG/ML injection, Inject 0.5 mLs (100 mg total) into the muscle once a week., Disp: 10 mL, Rfl: 1   tirzepatide (MOUNJARO) 10 MG/0.5ML Pen, Inject 10 mg into the skin once a week., Disp: 6 mL, Rfl: 2   triamcinolone ointment (KENALOG) 0.1 %, Apply 1 application topically 2 (two) times daily as needed for rash., Disp: , Rfl:   Observations/Objective: Patient is well-developed, well-nourished in no acute distress.  Resting comfortably at home.  Head is normocephalic, atraumatic.  No labored breathing. Speech is clear and coherent with logical content.  Patient is  alert and oriented at baseline.   Assessment and Plan: 1. Acute bacterial bronchitis (Primary) - benzonatate (TESSALON) 100 MG capsule; Take 1 capsule (100 mg total) by mouth 3 (three) times daily as needed for cough.  Dispense: 30 capsule; Refill: 0 - doxycycline (VIBRA-TABS) 100 MG tablet; Take 1 tablet (100 mg total) by mouth 2 (two) times daily.  Dispense: 14 tablet; Refill: 0  Rx Doxycycline.  Increase fluids.  Rest.  Saline nasal spray.  Probiotic.  Mucinex as directed.  Humidifier in bedroom. Tessalon per orders.  Call or return to clinic if symptoms are not improving.   Follow Up Instructions: I discussed the assessment and treatment plan with the patient. The patient was provided an opportunity to ask questions and all were answered. The patient agreed with the plan and demonstrated an understanding of the instructions.  A copy of instructions were sent to the patient via MyChart unless otherwise noted below.   The patient was advised to call back or seek an in-person evaluation if  the symptoms worsen or if the condition fails to improve as anticipated.    Piedad Climes, PA-C

## 2023-06-22 NOTE — Patient Instructions (Signed)
 [name], thank you for joining Piedad Climes, PA-C for today's virtual visit.  While this provider is not your primary care provider (PCP), if your PCP is located in our provider database this encounter information will be shared with them immediately following your visit.   A [facility] MyChart account gives you access to today's visit and all your visits, tests, and labs performed at [facility] " click here if you don't have a [facility] MyChart account or go to [email or web address]  Consent: (Patient) Michael Guerra provided verbal consent for this virtual visit at the beginning of the encounter.  Current Medications:  Current Outpatient Medications:    acetaminophen (TYLENOL) 500 MG tablet, Take 1,000 mg by mouth every 6 (six) hours as needed for moderate pain or headache., Disp: , Rfl:    aspirin 81 MG tablet, Take 81 mg by mouth daily., Disp: , Rfl:    atorvastatin (LIPITOR) 40 MG tablet, Take 1 tablet (40 mg total) by mouth daily., Disp: 90 tablet, Rfl: 3   Blood Glucose Monitoring Suppl (FREESTYLE LITE) w/Device KIT, Use daily to check blood sugar, Disp: 1 kit, Rfl: 0   carvedilol (COREG) 25 MG tablet, Take 1 tablet (25 mg total) by mouth 2 (two) times daily with a meal., Disp: 180 tablet, Rfl: 0   dapagliflozin propanediol (FARXIGA) 10 MG TABS tablet, Take 1 tablet (10 mg total) by mouth daily., Disp: 90 tablet, Rfl: 3   fluticasone (FLONASE) 50 MCG/ACT nasal spray, Place 2 sprays into both nostrils daily., Disp: 16 g, Rfl: 6   glucose blood (FREESTYLE LITE) test strip, Use daily to check blood sugar, Disp: 100 each, Rfl: 3   ipratropium (ATROVENT) 0.03 % nasal spray, Place 2 sprays into both nostrils every 12 (twelve) hours., Disp: 30 mL, Rfl: 12   Lactobacillus (PROBIOTIC ACIDOPHILUS PO), Take 1 tablet by mouth daily in the afternoon., Disp: , Rfl:    Lancets (FREESTYLE) lancets, Use to check blood sugars daily, Disp: 100 each, Rfl: 3   Melatonin 10  MG CAPS, Take 30 mg by mouth at bedtime as needed for sleep (sleep)., Disp: , Rfl:    Multiple Vitamins-Minerals (MULTIVITAMIN ADULT) TABS, Take 1 tablet by mouth daily with lunch. , Disp: , Rfl:    NEEDLE, DISP, 22 G (EASY TOUCH HYPODERMIC NEEDLE) 22G X 1-1/2" MISC, Use as directed with testosterone., Disp: 4 each, Rfl: 1   NEEDLE, DISP, 23 G (BD DISP NEEDLE) 23G X 1" MISC, Use as directed with testosterone., Disp: 4 each, Rfl: 1   Omega-3 Fatty Acids (FISH OIL) 1000 MG CAPS, Take 1,000 mg by mouth daily., Disp: , Rfl:    sacubitril-valsartan (ENTRESTO) 97-103 MG, Take 1 tablet by mouth 2 (two) times daily., Disp: 60 tablet, Rfl: 11   spironolactone (ALDACTONE) 25 MG tablet, Take 1/2 tablet (12.5 mg total) by mouth daily., Disp: 30 tablet, Rfl: 3   SYRINGE-NEEDLE, DISP, 3 ML (B-D 3CC LUER-LOK SYR 18GX1-1/2) 18G X 1-1/2" 3 ML MISC, Use as directed with testosterone., Disp: 4 each, Rfl: 1   testosterone cypionate (DEPOTESTOSTERONE CYPIONATE) 200 MG/ML injection, Inject 0.5 mLs (100 mg total) into the muscle once a week., Disp: 10 mL, Rfl: 1   tirzepatide (MOUNJARO) 10 MG/0.5ML Pen, Inject 10 mg into the skin once a week., Disp: 6 mL, Rfl: 2   triamcinolone ointment (KENALOG) 0.1 %, Apply 1 application topically 2 (two) times daily as needed for rash., Disp: , Rfl:    Medications ordered in this  encounter:  No orders of the defined types were placed in this encounter.    *If you need refills on other medications prior to your next appointment, please contact your pharmacy*  Follow-Up: Call back or seek an in-person evaluation if the symptoms worsen or if the condition fails to improve as anticipated.  [facility] [phone]  Other Instructions Take antibiotic (Doxycycline) as directed.  Increase fluids.  Get plenty of rest. Use Mucinex for congestion. Take the Tessalon as directed. Take a daily probiotic (I recommend Align or Culturelle, but even Activia Yogurt may be  beneficial).  A humidifier placed in the bedroom may offer some relief for a dry, scratchy throat of nasal irritation.  Read information below on acute bronchitis. Please call or return to clinic if symptoms are not improving.  Acute Bronchitis Bronchitis is when the airways that extend from the windpipe into the lungs get red, puffy, and painful (inflamed). Bronchitis often causes thick spit (mucus) to develop. This leads to a cough. A cough is the most common symptom of bronchitis. In acute bronchitis, the condition usually begins suddenly and goes away over time (usually in 2 weeks). Smoking, allergies, and asthma can make bronchitis worse. Repeated episodes of bronchitis may cause more lung problems.  HOME CARE Rest. Drink enough fluids to keep your pee (urine) clear or pale yellow (unless you need to limit fluids as told by your doctor). Only take over-the-counter or prescription medicines as told by your doctor. Avoid smoking and secondhand smoke. These can make bronchitis worse. If you are a smoker, think about using nicotine gum or skin patches. Quitting smoking will help your lungs heal faster. Reduce the chance of getting bronchitis again by: Washing your hands often. Avoiding people with cold symptoms. Trying not to touch your hands to your mouth, nose, or eyes. Follow up with your doctor as told.  GET HELP IF: Your symptoms do not improve after 1 week of treatment. Symptoms include: Cough. Fever. Coughing up thick spit. Body aches. Chest congestion. Chills. Shortness of breath. Sore throat.  GET HELP RIGHT AWAY IF:  You have an increased fever. You have chills. You have severe shortness of breath. You have bloody thick spit (sputum). You throw up (vomit) often. You lose too much body fluid (dehydration). You have a severe headache. You faint.  MAKE SURE YOU:  Understand these instructions. Will watch your condition. Will get help right away if you are not doing  well or get worse. Document Released: 09/23/2007 Document Revised: 12/07/2012 Document Reviewed: 09/27/2012 [facility] Patient Information 2015 [place], [place]. This information is not intended to replace advice given to you by your health care provider. Make sure you discuss any questions you have with your health care provider.    If you have been instructed to have an in-person evaluation today at a local Urgent Care facility, please use the link below. It will take you to a list of all of our available [facility], including address, phone number and hours of operation. Please do not delay care.  [facility]  If you or a family member do not have a primary care provider, use the link below to schedule a visit and establish care. When you choose a [facility] primary care physician or advanced practice provider, you gain a long-term partner in health. Find a Primary Care Provider  Learn more about [facility]'s in-office and virtual care options: [facility] - Get Care Now

## 2023-06-29 ENCOUNTER — Encounter: Payer: Self-pay | Admitting: Behavioral Health

## 2023-06-29 ENCOUNTER — Ambulatory Visit: Payer: Commercial Managed Care - PPO | Admitting: Behavioral Health

## 2023-06-29 DIAGNOSIS — F411 Generalized anxiety disorder: Secondary | ICD-10-CM

## 2023-06-29 NOTE — Progress Notes (Signed)
 [facility] Counselor/Therapist Progress Note  Patient ID: TOMI PADDOCK, MRN: [id number],    Date: 06/29/2023  Time Spent: 24 minutes, 1 PM until 1:24 PM.  This session was held via video teletherapy. The patient consented to the video teletherapy and was located in his home during this session. He is aware it is the responsibility of the patient to secure confidentiality on her end of the session. The provider was in a private home office for the duration of this session  Treatment Type: Individual Therapy  Reported Symptoms: stress  Mental Status Exam: Appearance:  Well Groomed     Behavior: Appropriate  Motor: Normal  Speech/Language:  Normal Rate  Affect: Appropriate  Mood: normal  Thought process: normal  Thought content:   normal  Sensory/Perceptual disturbances:   WNL  Orientation: oriented to person, place, time/date, situation, day of week, month of year, and year  Attention: Good  Concentration: Good  Memory: WNL  Fund of knowledge:  Good  Insight:   Good  Judgment:  Good  Impulse Control: Good   Risk Assessment: Danger to Self:  No Self-injurious Behavior: No Danger to Others: No Duty to Warn:no Physical Aggression / Violence:No  Access to Firearms a concern: No  Gang Involvement:No   Subjective: The patient has had a bronchial infection which has lasted for several weeks and still has residual cough.  His wife has had it and still has a and he thinks his daughter may be coming down with that.  That is complicated by the fact that his wife is supposed to speak at a conference this weekend and he and his daughter are going with her.  Today is his last day at work and he is looking forward to some time off but hopes everybody can be well enough to go and enjoy that time away as a family.  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  There are still somewhat short handed so  they are utilizing their staff as much as they can.  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  He feels that he is handling the stress at work as well as he can and is practicing his coping skills for reducing stress and anxiety and using those with his team also.   He feels that as he is doing fairly well and can spread appointments out so we will meet again in about 2-3 weeks.  Interventions: Cognitive Behavioral Therapy  Diagnosis: Generalized anxiety disorder  Plan: I will meet with the patient every 2 to 3 weeks via care agility.  Treatment plan: Goals are to reduce his anxiety/stress level by at least 50% with a target date of August 18, 2023.  Goals will include helping the patient to improve his ability to manage anxiety symptoms and better handle stress especially in the workplace.  Identify causes for anxiety and explore ways to lower it, resolve any core conflicts contributing to causes with anxiety as well as helping him manage thoughts and worrisome thinking contributing to anxiety.  Interventions include providing education about anxiety, facilitate and problem solution skills as well as coping skills to manage anxiety symptoms.  We will also use cognitive behavioral therapy to identify and change anxiety provoking thoughts and behavior patterns as well as dialectical behavior therapy to teach distress tolerance and mindfulness skills.  Progress 40%  [place], [facility]                  French Ana, [facility]               French Ana, [facility]               French Ana, [facility]               French Ana, [facility]               French Ana, [facility]               French Ana, [facility]

## 2023-07-02 ENCOUNTER — Other Ambulatory Visit (HOSPITAL_BASED_OUTPATIENT_CLINIC_OR_DEPARTMENT_OTHER): Payer: Self-pay

## 2023-07-02 ENCOUNTER — Other Ambulatory Visit: Payer: Self-pay | Admitting: Family Medicine

## 2023-07-02 ENCOUNTER — Other Ambulatory Visit: Payer: Self-pay

## 2023-07-02 MED ORDER — "BD DISP NEEDLE 23G X 1"" MISC"
1.0000 | 1 refills | Status: DC
  Filled 2023-07-02: qty 4, 28d supply, fill #0
  Filled 2024-03-10 (×2): qty 4, 28d supply, fill #1

## 2023-07-02 MED ORDER — "SYRINGE/NEEDLE (DISP) 18G X 1-1/2"" 3 ML MISC"
1.0000 | 1 refills | Status: DC
  Filled 2023-07-02: qty 4, 28d supply, fill #0
  Filled 2023-07-30 (×2): qty 4, 28d supply, fill #1

## 2023-07-05 DIAGNOSIS — Z7189 Other specified counseling: Secondary | ICD-10-CM | POA: Diagnosis not present

## 2023-07-06 ENCOUNTER — Other Ambulatory Visit (HOSPITAL_BASED_OUTPATIENT_CLINIC_OR_DEPARTMENT_OTHER): Payer: Self-pay

## 2023-07-15 ENCOUNTER — Other Ambulatory Visit: Payer: Self-pay

## 2023-07-15 ENCOUNTER — Other Ambulatory Visit: Payer: Self-pay | Admitting: Cardiology

## 2023-07-15 ENCOUNTER — Other Ambulatory Visit (HOSPITAL_COMMUNITY): Payer: Self-pay

## 2023-07-15 DIAGNOSIS — I255 Ischemic cardiomyopathy: Secondary | ICD-10-CM

## 2023-07-15 DIAGNOSIS — I10 Essential (primary) hypertension: Secondary | ICD-10-CM

## 2023-07-15 MED ORDER — SPIRONOLACTONE 25 MG PO TABS
12.5000 mg | ORAL_TABLET | Freq: Every day | ORAL | 0 refills | Status: DC
Start: 2023-07-15 — End: 2023-09-15
  Filled 2023-07-15: qty 15, 30d supply, fill #0

## 2023-07-20 DIAGNOSIS — Z7189 Other specified counseling: Secondary | ICD-10-CM | POA: Diagnosis not present

## 2023-07-22 ENCOUNTER — Ambulatory Visit (HOSPITAL_COMMUNITY)

## 2023-07-22 ENCOUNTER — Ambulatory Visit (INDEPENDENT_AMBULATORY_CARE_PROVIDER_SITE_OTHER)

## 2023-07-22 ENCOUNTER — Other Ambulatory Visit (HOSPITAL_BASED_OUTPATIENT_CLINIC_OR_DEPARTMENT_OTHER): Payer: Self-pay

## 2023-07-22 ENCOUNTER — Encounter: Payer: Self-pay | Admitting: Urgent Care

## 2023-07-22 ENCOUNTER — Ambulatory Visit
Admission: EM | Admit: 2023-07-22 | Discharge: 2023-07-22 | Disposition: A | Discharge status: Home / Self Care | Attending: Family Medicine | Admitting: Family Medicine

## 2023-07-22 DIAGNOSIS — M50222 Other cervical disc displacement at C5-C6 level: Secondary | ICD-10-CM | POA: Diagnosis not present

## 2023-07-22 DIAGNOSIS — M4802 Spinal stenosis, cervical region: Secondary | ICD-10-CM | POA: Diagnosis not present

## 2023-07-22 DIAGNOSIS — M542 Cervicalgia: Secondary | ICD-10-CM | POA: Diagnosis not present

## 2023-07-22 DIAGNOSIS — E119 Type 2 diabetes mellitus without complications: Secondary | ICD-10-CM | POA: Diagnosis not present

## 2023-07-22 DIAGNOSIS — I509 Heart failure, unspecified: Secondary | ICD-10-CM

## 2023-07-22 DIAGNOSIS — M47812 Spondylosis without myelopathy or radiculopathy, cervical region: Secondary | ICD-10-CM | POA: Diagnosis not present

## 2023-07-22 DIAGNOSIS — M50323 Other cervical disc degeneration at C6-C7 level: Secondary | ICD-10-CM | POA: Diagnosis not present

## 2023-07-22 DIAGNOSIS — M5030 Other cervical disc degeneration, unspecified cervical region: Secondary | ICD-10-CM

## 2023-07-22 MED ORDER — PREDNISONE 20 MG PO TABS
40.0000 mg | ORAL_TABLET | Freq: Every day | ORAL | 0 refills | Status: DC
  Filled 2023-07-22: qty 10, 5d supply, fill #0

## 2023-07-22 NOTE — ED Provider Notes (Signed)
 [facility]  Note:  This document was prepared using Conservation officer, historic buildings and may include unintentional dictation errors.  MRN: [id number] DOB: 24-Sep-1960  Subjective:   Michael Guerra is a 63 y.o. male presenting for 3 to 4-day history of acute onset persistent neck pain that radiates to the left shoulder and left arm.  Has used ibuprofen with minimal relief.  No fall, trauma, history of musculoskeletal disorders.  Patient has well-controlled diabetes treated without insulin.  Has a history of congestive heart failure but his ejection fraction was well-preserved as seen from his echocardiogram 2 years ago.  Has a solitary kidney.  No current facility-administered medications for this encounter.  Current Outpatient Medications:    acetaminophen (TYLENOL) 500 MG tablet, Take 1,000 mg by mouth every 6 (six) hours as needed for moderate pain or headache., Disp: , Rfl:    aspirin 81 MG tablet, Take 81 mg by mouth daily., Disp: , Rfl:    atorvastatin (LIPITOR) 40 MG tablet, Take 1 tablet (40 mg total) by mouth daily., Disp: 90 tablet, Rfl: 3   benzonatate (TESSALON) 100 MG capsule, Take 1 capsule (100 mg total) by mouth 3 (three) times daily as needed for cough., Disp: 30 capsule, Rfl: 0   Blood Glucose Monitoring Suppl (FREESTYLE LITE) w/Device KIT, Use daily to check blood sugar, Disp: 1 kit, Rfl: 0   carvedilol (COREG) 25 MG tablet, Take 1 tablet (25 mg total) by mouth 2 (two) times daily with a meal., Disp: 180 tablet, Rfl: 0   dapagliflozin propanediol (FARXIGA) 10 MG TABS tablet, Take 1 tablet (10 mg total) by mouth daily., Disp: 90 tablet, Rfl: 3   doxycycline (VIBRA-TABS) 100 MG tablet, Take 1 tablet (100 mg total) by mouth 2 (two) times daily., Disp: 14 tablet, Rfl: 0   fluticasone (FLONASE) 50 MCG/ACT nasal spray, Place 2 sprays into both nostrils daily., Disp: 16 g, Rfl: 6   glucose blood (FREESTYLE LITE) test strip, Use daily to check blood sugar,  Disp: 100 each, Rfl: 3   ipratropium (ATROVENT) 0.03 % nasal spray, Place 2 sprays into both nostrils every 12 (twelve) hours., Disp: 30 mL, Rfl: 12   Lactobacillus (PROBIOTIC ACIDOPHILUS PO), Take 1 tablet by mouth daily in the afternoon., Disp: , Rfl:    Lancets (FREESTYLE) lancets, Use to check blood sugars daily, Disp: 100 each, Rfl: 3   Melatonin 10 MG CAPS, Take 30 mg by mouth at bedtime as needed for sleep (sleep)., Disp: , Rfl:    Multiple Vitamins-Minerals (MULTIVITAMIN ADULT) TABS, Take 1 tablet by mouth daily with lunch. , Disp: , Rfl:    NEEDLE, DISP, 22 G (EASY TOUCH HYPODERMIC NEEDLE) 22G X 1-1/2" MISC, Use as directed with testosterone., Disp: 4 each, Rfl: 1   NEEDLE, DISP, 23 G (BD DISP NEEDLE) 23G X 1" MISC, Use as directed with testosterone., Disp: 4 each, Rfl: 1   Omega-3 Fatty Acids (FISH OIL) 1000 MG CAPS, Take 1,000 mg by mouth daily., Disp: , Rfl:    sacubitril-valsartan (ENTRESTO) 97-103 MG, Take 1 tablet by mouth 2 (two) times daily., Disp: 60 tablet, Rfl: 11   spironolactone (ALDACTONE) 25 MG tablet, Take 0.5 tablets (12.5 mg total) by mouth daily. Patient needs appointment for further refills. 1 st attempt, Disp: 15 tablet, Rfl: 0   SYRINGE-NEEDLE, DISP, 3 ML (B-D 3CC LUER-LOK SYR 18GX1-1/2) 18G X 1-1/2" 3 ML MISC, Use as directed with testosterone., Disp: 4 each, Rfl: 1   testosterone cypionate (  DEPOTESTOSTERONE CYPIONATE) 200 MG/ML injection, Inject 0.5 mLs (100 mg total) into the muscle once a week., Disp: 10 mL, Rfl: 1   tirzepatide (MOUNJARO) 10 MG/0.5ML Pen, Inject 10 mg into the skin once a week., Disp: 6 mL, Rfl: 2   triamcinolone ointment (KENALOG) 0.1 %, Apply 1 application topically 2 (two) times daily as needed for rash., Disp: , Rfl:    Allergies  Allergen Reactions   Lisinopril Cough    Past Medical History:  Diagnosis Date   CAD (coronary artery disease) 06/08/2019   Cardiomyopathy (HCC) 05/23/2019   Cataract    Chest discomfort 05/12/2019   CHF  (congestive heart failure) (HCC)    Chronic kidney disease    one kidney that is healthy   Chronic left shoulder pain 02/05/2020   Diabetes mellitus due to underlying condition with unspecified complications (HCC) 05/12/2019   Essential hypertension 05/12/2019   Foot sprain, left, initial encounter 06/22/2019   Hyperlipidemia 02/17/2006   Qualifier: Diagnosis of  By: Maple Hudson MD, Riki Rusk     LIVER FUNCTION TESTS, ABNORMAL 05/21/2006   Qualifier: Diagnosis of  By: Maple Hudson MD, Riki Rusk     Mixed dyslipidemia 05/12/2019   Other specified abnormal findings of blood chemistry 05/20/2011   Formatting of this note might be different from the original. Elevated liver function tests 10/1 IMO update   Rectal polyp 05/20/2011   Formatting of this note might be different from the original. Noted march 2011 fu in march 2016   Renal insufficiency 05/23/2019   Skin lesion of chest wall 05/20/2011   Formatting of this note might be different from the original. Mid chest (prob seb/epidermoid cyst)   Solitary kidney    Type 2 diabetes mellitus with hyperglycemia, without long-term current use of insulin (HCC) 02/17/2006   Qualifier: Diagnosis of  By: Maple Hudson MD, Riki Rusk       Past Surgical History:  Procedure Laterality Date   ABDOMINAL SURGERY     KIDNEY SURGERY     bowel obstruction  x 3   LEFT HEART CATH AND CORONARY ANGIOGRAPHY N/A 05/31/2019   Procedure: LEFT HEART CATH AND CORONARY ANGIOGRAPHY;  Surgeon: Iran Ouch, MD;  Location: [facility];  Service: Cardiovascular;  Laterality: N/A;    Family History  Problem Relation Age of Onset   Cancer Neg Hx    Colon cancer Neg Hx    Esophageal cancer Neg Hx    Rectal cancer Neg Hx    Stomach cancer Neg Hx     Social History   Tobacco Use   Smoking status: Never   Smokeless tobacco: Never  Vaping Use   Vaping status: Never Used  Substance Use Topics   Alcohol use: No   Drug use: No    ROS   Objective:   Vitals: BP (P) 127/87 (BP  Location: Right Arm)   Pulse (P) 87   Temp (P) 98.1 F (36.7 C) (Oral)   Resp (P) 20   SpO2 (P) 95%   Physical Exam Constitutional:      General: He is not in acute distress.    Appearance: Normal appearance. He is well-developed and normal weight. He is not ill-appearing, toxic-appearing or diaphoretic.  HENT:     Head: Normocephalic and atraumatic.     Right Ear: External ear normal.     Left Ear: External ear normal.     Nose: Nose normal.     Mouth/Throat:     Pharynx: Oropharynx is clear.  Eyes:  General: No scleral icterus.       Right eye: No discharge.        Left eye: No discharge.     Extraocular Movements: Extraocular movements intact.  Cardiovascular:     Rate and Rhythm: Normal rate.  Pulmonary:     Effort: Pulmonary effort is normal.  Musculoskeletal:     Cervical back: Normal range of motion. Spasms and tenderness present. No swelling, edema, deformity, erythema, signs of trauma, lacerations, rigidity, torticollis, bony tenderness or crepitus. Pain with movement present. Normal range of motion.  Neurological:     Mental Status: He is alert and oriented to person, place, and time.  Psychiatric:        Mood and Affect: Mood normal.        Behavior: Behavior normal.        Thought Content: Thought content normal.        Judgment: Judgment normal.     DG Cervical Spine Complete Result Date: 07/22/2023 CLINICAL DATA:  Neck pain. EXAM: CERVICAL SPINE - COMPLETE 4+ VIEW COMPARISON:  April 08, 2004. FINDINGS: Minimal grade 1 retrolisthesis of C5-6 is noted secondary to mild degenerative disc disease at this level. Mild degenerative disc disease is also noted at C6-7. No fracture or prevertebral soft tissue swelling is noted. Mild left-sided neural foraminal stenosis is noted at C5-6 secondary to uncovertebral spurring. IMPRESSION: Multilevel degenerative changes as noted above. No acute abnormality seen. Electronically Signed   By: Lupita Raider M.D.   On:  07/22/2023 12:35    Assessment and Plan :   PDMP not reviewed this encounter.  1. Degenerative disc disease, cervical   2. Neck pain   3. Type 2 diabetes mellitus without complication, without long-term current use of insulin (HCC)   4. Congestive heart failure, unspecified HF chronicity, unspecified heart failure type (HCC)    Recommended a oral prednisone course for his neck pain likely due to underlying arthritis, degenerative changes.  Discussed neck and back care.  Prednisone is appropriate despite his diabetes and history of congestive heart failure as these are well-controlled conditions.  Recommend follow-up with the spine specialty clinic. Counseled patient on potential for adverse effects with medications prescribed/recommended today, ER and return-to-clinic precautions discussed, patient verbalized understanding.     Wallis Bamberg, PA-C 07/22/23 1330

## 2023-07-22 NOTE — ED Triage Notes (Signed)
 Pt c/o pain to left side of neck that radiates to left shoulder "soreness"-denies injury-pan worse with movement-ibuprofen with some relief-NAD-steady gait

## 2023-07-29 ENCOUNTER — Ambulatory Visit: Admitting: Behavioral Health

## 2023-07-29 ENCOUNTER — Encounter: Payer: Self-pay | Admitting: Behavioral Health

## 2023-07-29 DIAGNOSIS — F411 Generalized anxiety disorder: Secondary | ICD-10-CM | POA: Diagnosis not present

## 2023-07-29 NOTE — Progress Notes (Signed)
  Behavioral Health Counselor/Therapist Progress Note  Patient ID: TERON BLAIS, MRN: [id number],    Date: 07/29/2023  Time Spent: 49 minutes, 1 PM until 1:49 PM.  This session was held via video teletherapy. The patient consented to the video teletherapy and was located in his home during this session. He is aware it is the responsibility of the patient to secure confidentiality on her end of the session. The provider was in a private home office for the duration of this session  Treatment Type: Individual Therapy  Reported Symptoms: stress  Mental Status Exam: Appearance:  Well Groomed     Behavior: Appropriate  Motor: Normal  Speech/Language:  Normal Rate  Affect: Appropriate  Mood: normal  Thought process: normal  Thought content:   normal  Sensory/Perceptual disturbances:   WNL  Orientation: oriented to person, place, time/date, situation, day of week, month of year, and year  Attention: Good  Concentration: Good  Memory: WNL  Fund of knowledge:  Good  Insight:   Good  Judgment:  Good  Impulse Control: Good   Risk Assessment: Danger to Self:  No Self-injurious Behavior: No Danger to Others: No Duty to Warn:no Physical Aggression / Violence:No  Access to Firearms a concern: No  Gang Involvement:No   Subjective: The patient woke up 1 day last week with significant neck pain.  He went to the urgent care where they found that he had degenerative disk in the cervical region and is following up with a spine specialist next week.  He was put on prednisone which has reduced the pain significantly.  He had been out of work a couple of days but in going back earlier this week for the most part he said it has been manageable.  He has been taking classes on emotional intelligence as well as looking improved communication.  We looked at what emotional intelligence means especially with men.  We talked about what we feel that we recognize what we feel the difficulties of the  emotional expression and healthy ways to be emotionally expressive.  We talked about verbal and nonverbal communication and active listening in terms of personal and professional.  He cited an incident that took place at work last week where he was paying attention and was able to de-escalate someone fairly easily because he was watching and listening.  We talked about how we can apply those types of skills into the family is also.  The patient does contract for safety having no thoughts of hurting himself or anyone else. He feels that as he is doing fairly well and can spread appointments out so we will meet again in about 2-3 weeks.  Interventions: Cognitive Behavioral Therapy  Diagnosis: Generalized anxiety disorder  Plan: I will meet with the patient every 2 to 3 weeks via care agility.  Treatment plan: Goals are to reduce his anxiety/stress level by at least 50% with a target date of August 18, 2023.  Goals will include helping the patient to improve his ability to manage anxiety symptoms and better handle stress especially in the workplace.  Identify causes for anxiety and explore ways to lower it, resolve any core conflicts contributing to causes with anxiety as well as helping him manage thoughts and worrisome thinking contributing to anxiety.  Interventions include providing education about anxiety, facilitate and problem solution skills as well as coping skills to manage anxiety symptoms.  We will also use cognitive behavioral therapy to identify and change anxiety provoking thoughts and behavior patterns  as well as dialectical behavior therapy to teach distress tolerance and mindfulness skills.  Progress 40%  [place] Ana, [facility]                  French Ana, [facility]               French Ana, [facility]               French Ana, [facility]               French Ana, [facility]               French Ana,  [facility]               French Ana, [facility]               French Ana, [facility]

## 2023-07-30 ENCOUNTER — Other Ambulatory Visit (HOSPITAL_COMMUNITY): Payer: Self-pay | Admitting: Cardiology

## 2023-07-30 ENCOUNTER — Other Ambulatory Visit (HOSPITAL_BASED_OUTPATIENT_CLINIC_OR_DEPARTMENT_OTHER): Payer: Self-pay

## 2023-07-30 ENCOUNTER — Other Ambulatory Visit: Payer: Self-pay

## 2023-07-30 ENCOUNTER — Other Ambulatory Visit: Payer: Self-pay | Admitting: Family Medicine

## 2023-07-30 DIAGNOSIS — E785 Hyperlipidemia, unspecified: Secondary | ICD-10-CM

## 2023-07-30 MED ORDER — "NEEDLE (DISP) 22G X 1-1/2"" MISC"
1 refills | Status: DC
  Filled 2023-07-30 (×2): qty 4, 28d supply, fill #0
  Filled 2023-09-22: qty 4, 28d supply, fill #1

## 2023-07-30 MED ORDER — ATORVASTATIN CALCIUM 40 MG PO TABS
40.0000 mg | ORAL_TABLET | Freq: Every day | ORAL | 1 refills | Status: DC
Start: 2023-07-30 — End: 2024-02-28
  Filled 2023-07-30 (×2): qty 90, 90d supply, fill #0
  Filled 2023-11-04: qty 90, 90d supply, fill #1

## 2023-08-03 ENCOUNTER — Other Ambulatory Visit (HOSPITAL_BASED_OUTPATIENT_CLINIC_OR_DEPARTMENT_OTHER): Payer: Self-pay

## 2023-08-03 DIAGNOSIS — Z7189 Other specified counseling: Secondary | ICD-10-CM | POA: Diagnosis not present

## 2023-08-03 MED ORDER — CARVEDILOL 25 MG PO TABS
25.0000 mg | ORAL_TABLET | Freq: Two times a day (BID) | ORAL | 0 refills | Status: DC
  Filled 2023-08-03: qty 60, 30d supply, fill #0

## 2023-08-05 ENCOUNTER — Ambulatory Visit (HOSPITAL_COMMUNITY)
Admission: RE | Admit: 2023-08-05 | Discharge: 2023-08-05 | Disposition: A | Discharge status: Home / Self Care | Source: Ambulatory Visit | Attending: Adult Health | Admitting: Adult Health

## 2023-08-05 VITALS — BP 112/82 | HR 85 | Wt 193.8 lb

## 2023-08-05 DIAGNOSIS — E1122 Type 2 diabetes mellitus with diabetic chronic kidney disease: Secondary | ICD-10-CM | POA: Insufficient documentation

## 2023-08-05 DIAGNOSIS — I130 Hypertensive heart and chronic kidney disease with heart failure and stage 1 through stage 4 chronic kidney disease, or unspecified chronic kidney disease: Secondary | ICD-10-CM | POA: Diagnosis not present

## 2023-08-05 DIAGNOSIS — I2510 Atherosclerotic heart disease of native coronary artery without angina pectoris: Secondary | ICD-10-CM | POA: Insufficient documentation

## 2023-08-05 DIAGNOSIS — Z79899 Other long term (current) drug therapy: Secondary | ICD-10-CM | POA: Diagnosis not present

## 2023-08-05 DIAGNOSIS — Z7985 Long-term (current) use of injectable non-insulin antidiabetic drugs: Secondary | ICD-10-CM | POA: Diagnosis not present

## 2023-08-05 DIAGNOSIS — Z905 Acquired absence of kidney: Secondary | ICD-10-CM | POA: Insufficient documentation

## 2023-08-05 DIAGNOSIS — Z7982 Long term (current) use of aspirin: Secondary | ICD-10-CM | POA: Insufficient documentation

## 2023-08-05 DIAGNOSIS — Z7984 Long term (current) use of oral hypoglycemic drugs: Secondary | ICD-10-CM | POA: Diagnosis not present

## 2023-08-05 DIAGNOSIS — I429 Cardiomyopathy, unspecified: Secondary | ICD-10-CM | POA: Insufficient documentation

## 2023-08-05 DIAGNOSIS — I10 Essential (primary) hypertension: Secondary | ICD-10-CM

## 2023-08-05 DIAGNOSIS — N1831 Chronic kidney disease, stage 3a: Secondary | ICD-10-CM | POA: Insufficient documentation

## 2023-08-05 DIAGNOSIS — E785 Hyperlipidemia, unspecified: Secondary | ICD-10-CM | POA: Insufficient documentation

## 2023-08-05 DIAGNOSIS — I5022 Chronic systolic (congestive) heart failure: Secondary | ICD-10-CM | POA: Diagnosis not present

## 2023-08-05 DIAGNOSIS — M542 Cervicalgia: Secondary | ICD-10-CM | POA: Diagnosis not present

## 2023-08-05 DIAGNOSIS — Z6825 Body mass index (BMI) 25.0-25.9, adult: Secondary | ICD-10-CM | POA: Diagnosis not present

## 2023-08-05 NOTE — Progress Notes (Signed)
 PCP: Jobe Mulder, DO Cardiology: Dr. Lafayette Pierre HF Cardiology: Dr. Mitzie Anda  Chief Complaint: Heart Failure Follow up  63 y.o. with history of CKD stage 3, HTN, and diabetes was referred for evaluation of CHF by Dr. Lafayette Pierre.  Patient has had diabetes and HTN for about 10-15 years per his report.  He has CKD stage 3 in setting of nephrectomy done in childhood due to trauma, follows with nephrology.  He developed atypical chest pain in 2/21 and had Cardiolite done.  This showed EF 23% with fixed inferior defect.  Echo showed EF 35-40%, mild LV dilation, no LVH, normal RV.  He then had LHC done, showing OM1 65% stenosis, distal LAD 70% stenosis.  Cardiomyopathy was thought to be out of proportion to CAD. No recent viral-type infection symptoms.  No strong family history of CHF.    Cardiac MRI in 12/21 was a difficult study, EF 15% with diffuse hypokinesis, mildly decreased RV systolic function, no LGE.    Echo in 3/22 showed EF 30-35%, diffuse hypokinesis, normal RV, normal IVC.    Echo 8/22, EF 30-35% with mildly decreased RV function, normal IVC.   Seen recently by Dr. Lafayette Pierre 5/23. Endorsed positional dizziness. Spiro was reduced to 12.5 mg and monitor placed to assess for arrhythmias. He is currently wearing event monitor.  Echo 2024 EF 60-65%    Today he returns for HF follow up.Overall feeling fine. Denies SOB/PND/Orthopnea. Appetite ok. No fever or chills. Weight at home stable. Taking all medications. Recent ran out of coreg but has just got it refilled.  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   PMH: 1. CKD stage 3: H/o nephrectomy in childhood post-trauma.  2. HTN - Renal artery dopplers (9/21): No significant stenosis.  3. Hyperlipidemia 4. Type 2 diabetes 5. CAD: LHC (2/21) with OM1 65% stenosis, distal LAD 70% stenosis.  6. Chronic systolic CHF: Nonischemic cardiomyopathy, cardiomyopathy out of proportion to CAD.  - Echo (2/21) with EF 35-40%,  mild LV dilation, no LVH, normal RV.  - Cardiac MRI (12/21): Difficult study, EF 15% with diffuse hypokinesis, mildly decreased RV systolic function, no LGE.   - Echo (3/22): EF 30-35%, diffuse hypokinesis, normal RV, normal IVC.  - Echo (8/22): EF 30-35% with mildly decreased RV function, normal IVC.  - Echo (2023): EF 45-50%  -Echo (2024): EF 60-65%   SH: Works as a Electrical engineer at [employer], married, 1 child, no ETOH or smoking.   FH: Mother with diabetes, possible CHF.   ROS: All systems reviewed and negative except as per HPI.   Current Outpatient Medications  Medication Sig Dispense Refill   acetaminophen (TYLENOL) 500 MG tablet Take 1,000 mg by mouth every 6 (six) hours as needed for moderate pain or headache.     aspirin 81 MG tablet Take 81 mg by mouth daily.     atorvastatin (LIPITOR) 40 MG tablet Take 1 tablet (40 mg total) by mouth daily. 90 tablet 1   benzonatate (TESSALON) 100 MG capsule Take 1 capsule (100 mg total) by mouth 3 (three) times daily as needed for cough. 30 capsule 0   Blood Glucose Monitoring Suppl (FREESTYLE LITE) w/Device KIT Use daily to check blood sugar 1 kit 0   dapagliflozin propanediol (FARXIGA) 10 MG TABS tablet Take 1 tablet (10 mg total) by mouth daily. 90 tablet 3   fluticasone (FLONASE) 50 MCG/ACT nasal spray Place 2 sprays into both nostrils daily. (Patient taking differently: Place 2 sprays  into both nostrils daily as needed.) 16 g 6   glucose blood (FREESTYLE LITE) test strip Use daily to check blood sugar 100 each 3   ipratropium (ATROVENT) 0.03 % nasal spray Place 2 sprays into both nostrils every 12 (twelve) hours. (Patient taking differently: Place 2 sprays into both nostrils every 12 (twelve) hours. As needed) 30 mL 12   Lactobacillus (PROBIOTIC ACIDOPHILUS PO) Take 1 tablet by mouth daily in the afternoon.     Lancets (FREESTYLE) lancets Use to check blood sugars daily 100 each 3   Melatonin 10 MG CAPS Take 20 mg by mouth at bedtime as  needed for sleep (sleep).     Multiple Vitamins-Minerals (MULTIVITAMIN ADULT) TABS Take 1 tablet by mouth daily with lunch.      NEEDLE, DISP, 22 G (EASY TOUCH HYPODERMIC NEEDLE) 22G X 1-1/2" MISC Use as directed with testosterone. 4 each 1   NEEDLE, DISP, 23 G (BD DISP NEEDLE) 23G X 1" MISC Use as directed with testosterone. 4 each 1   Omega-3 Fatty Acids (FISH OIL) 1000 MG CAPS Take 1,000 mg by mouth daily.     sacubitril-valsartan (ENTRESTO) 97-103 MG Take 1 tablet by mouth 2 (two) times daily. 60 tablet 11   spironolactone (ALDACTONE) 25 MG tablet Take 0.5 tablets (12.5 mg total) by mouth daily. Patient needs appointment for further refills. 1 st attempt 15 tablet 0   SYRINGE-NEEDLE, DISP, 3 ML (B-D 3CC LUER-LOK SYR 18GX1-1/2) 18G X 1-1/2" 3 ML MISC Use as directed with testosterone. 4 each 1   testosterone cypionate (DEPOTESTOSTERONE CYPIONATE) 200 MG/ML injection Inject 0.5 mLs (100 mg total) into the muscle once a week. (Patient taking differently: Inject 100 mg into the muscle once a week. Wednesdays) 10 mL 1   tirzepatide (MOUNJARO) 10 MG/0.5ML Pen Inject 10 mg into the skin once a week. (Patient taking differently: Inject 10 mg into the skin once a week. Takes on Mondays) 6 mL 2   triamcinolone ointment (KENALOG) 0.1 % Apply 1 application topically 2 (two) times daily as needed for rash.     carvedilol (COREG) 25 MG tablet Take 1 tablet (25 mg total) by mouth 2 (two) times daily with a meal. (Patient not taking: Reported on 08/05/2023) 60 tablet 0   No current facility-administered medications for this encounter.   BP 112/82   Pulse 85   Wt 87.9 kg (193 lb 12.8 oz)   SpO2 96%   BMI 25.57 kg/m  PHYSICAL EXAM: General:   No resp difficulty Neck: supple. no JVD.  Cor: PMI nondisplaced. Regular rate & rhythm. No rubs, gallops or murmurs. Lungs: clear Abdomen: soft, nontender, nondistended.  Extremities: no cyanosis, clubbing, rash, edema Neuro: alert & oriented  x3   Assessment/Plan: 1. Chronic systolic CHF: Echo in 2/21 with EF 35-40%.  LHC in 2/21 with moderate coronary disease, but coronary disease appears out of proportion to the severity of the cardiomyopathy.  It is possible that long-standing HTN and DM have triggered the cardiomyopathy.  Cardiac MRI in 12/21 was a difficult study, showed EF 15% with diffuse hypokinesis, mildly decreased RV systolic function, no LGE.  No evidence for infiltrative disease or myocarditis. Echo in 8/22 stable EF 30-35%, mildly decreased RV systolic function. Echo 3/23 EF improved  45-50%, RV normal. Most recent Echo EF 60-65% RV normal. We discussed.  - NYHA I. Appears euvolemic.  - Continue Entresto to 97/103 bid.   - Continue Coreg 25 mg bid.  - Continue spironolactone 12.5 mg daily (  on low dose due to positional dizziness).  - Continue Farxiga 10 mg daily.  2. CAD: Moderate disease, no intervention on 2/21 cath.  No chest pain.  - Continue ASA 81 daily.  - Continue atorvastatin. PCP following LDL 3. HTN: BP stable.  4. CKD stage 3a: Solitary kidney.  - - Continue Farxiga.   Follow up in 1 year. I reviewed BMET from 05/2023, stable.   Nieves Bars, NP-C  08/05/2023

## 2023-08-05 NOTE — Patient Instructions (Signed)
   Follow-Up in: 1 year PLEASE CALL OUR OFFICE AROUND February 2026 TO GET SCHEDULED FOR YOUR APPOINTMENT. PHONE NUMBER IS [phone] OPTION 2    At the [facility], you and your health needs are our priority. We have a designated team specialized in the treatment of Heart Failure. This Care Team includes your primary Heart Failure Specialized Cardiologist (physician), Advanced Practice Providers (APPs- Physician Assistants and Nurse Practitioners), and Pharmacist who all work together to provide you with the care you need, when you need it.   You may see any of the following providers on your designated Care Team at your next follow up:  Dr. Jules Oar Dr. Peder Bourdon Dr. Alwin Baars Dr. Judyth Nunnery Nieves Bars, NP Ruddy Corral, [place] [facility] [place], [place] Dennise Fitz, NP [place] Lee, NP Luster Salters, PharmD   Please be sure to bring in all your medications bottles to every appointment.   Need to Contact Us :  If you have any questions or concerns before your next appointment please send us  a message through [place] or call our office at [phone].    TO LEAVE A MESSAGE FOR THE NURSE SELECT OPTION 2, PLEASE LEAVE A MESSAGE INCLUDING: YOUR NAME DATE OF BIRTH CALL BACK NUMBER REASON FOR CALL**this is important as we prioritize the call backs  YOU WILL RECEIVE A CALL BACK THE SAME DAY AS LONG AS YOU CALL BEFORE 4:00 PM

## 2023-08-17 ENCOUNTER — Encounter: Payer: Self-pay | Admitting: Cardiology

## 2023-08-17 DIAGNOSIS — M542 Cervicalgia: Secondary | ICD-10-CM

## 2023-08-17 DIAGNOSIS — Z7189 Other specified counseling: Secondary | ICD-10-CM | POA: Diagnosis not present

## 2023-08-17 HISTORY — DX: Cervicalgia: M54.2

## 2023-08-18 ENCOUNTER — Encounter: Payer: Self-pay | Admitting: Cardiology

## 2023-08-18 ENCOUNTER — Ambulatory Visit: Attending: Cardiology | Admitting: Cardiology

## 2023-08-18 VITALS — BP 122/70 | HR 83 | Ht 73.0 in | Wt 194.0 lb

## 2023-08-18 DIAGNOSIS — I10 Essential (primary) hypertension: Secondary | ICD-10-CM | POA: Diagnosis not present

## 2023-08-18 DIAGNOSIS — E1165 Type 2 diabetes mellitus with hyperglycemia: Secondary | ICD-10-CM | POA: Diagnosis not present

## 2023-08-18 DIAGNOSIS — I2510 Atherosclerotic heart disease of native coronary artery without angina pectoris: Secondary | ICD-10-CM | POA: Diagnosis not present

## 2023-08-18 DIAGNOSIS — R002 Palpitations: Secondary | ICD-10-CM | POA: Diagnosis not present

## 2023-08-18 DIAGNOSIS — N289 Disorder of kidney and ureter, unspecified: Secondary | ICD-10-CM | POA: Diagnosis not present

## 2023-08-18 DIAGNOSIS — E782 Mixed hyperlipidemia: Secondary | ICD-10-CM

## 2023-08-18 NOTE — Patient Instructions (Signed)
 Medication Instructions:  Your physician recommends that you continue on your current medications as directed. Please refer to the Current Medication list given to you today.  *If you need a refill on your cardiac medications before your next appointment, please call your pharmacy*   Lab Work: None Ordered If you have labs (blood work) drawn today and your tests are completely normal, you will receive your results only by: MyChart Message (if you have MyChart) OR A paper copy in the mail If you have any lab test that is abnormal or we need to change your treatment, we will call you to review the results.   Testing/Procedures: None Ordered   Follow-Up: At [facility], you and your health needs are our priority.  As part of our continuing mission to provide you with exceptional heart care, we have created designated Provider Care Teams.  These Care Teams include your primary Cardiologist (physician) and Advanced Practice Providers (APPs -  Physician Assistants and Nurse Practitioners) who all work together to provide you with the care you need, when you need it.  We recommend signing up for the patient portal called "MyChart".  Sign up information is provided on this After Visit Summary.  MyChart is used to connect with patients for Virtual Visits (Telemedicine).  Patients are able to view lab/test results, encounter notes, upcoming appointments, etc.  Non-urgent messages can be sent to your provider as well.   To learn more about what you can do with MyChart, go to [email or web address].    Your next appointment:   9 month follow up

## 2023-08-18 NOTE — Progress Notes (Signed)
 Cardiology Office Note:    Date:  08/18/2023   ID:  Michael Guerra, DOB March 02, 1961, MRN [id number]  PCP:  Michael Mulder, DO  Cardiologist:  Michael Balzarine, MD   Referring MD: Michael Guerra*    ASSESSMENT:    1. Palpitations   2. Essential hypertension   3. Coronary artery disease involving native coronary artery of native heart without angina pectoris   4. Type 2 diabetes mellitus with hyperglycemia, without long-term current use of insulin (HCC)   5. Renal insufficiency   6. Mixed dyslipidemia    PLAN:    In order of problems listed above:  Coronary artery disease: Secondary prevention stressed with the patient.  Importance of compliance with diet medication stressed and patient vocalized understanding.  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 Essential hypertension: Blood pressure is stable and diet was emphasized. Mixed dyslipidemia: On lipid-lowering medications followed by primary care.  Lipids reviewed and found to be at goal. Diabetes mellitus and renal insufficiency.  Patient has solitary kidney.  I discussed this issues with him at length.  Diabetes is not under the best of control. Patient will be seen in follow-up appointment in 6 months or earlier if the patient has any concerns.    Medication Adjustments/Labs and Tests Ordered: Current medicines are reviewed at length with the patient today.  Concerns regarding medicines are outlined above.  Orders Placed This Encounter  Procedures   EKG 12-Lead   No orders of the defined types were placed in this encounter.    No chief complaint on file.    History of Present Illness:    Michael Guerra is a 62 y.o. male.  Patient has past medical history of coronary artery disease, history of cardiomyopathy, essential hypertension, mixed dyslipidemia, diabetes mellitus and renal insufficiency with a solitary kidney.  She denies any problems at this  time and takes care of activities of daily living.  No chest pain orthopnea or PND.  At the time of my evaluation, the patient is alert awake oriented and in no distress.  Past Medical History:  Diagnosis Date   CAD (coronary artery disease) 06/08/2019   Cardiomyopathy (HCC) 05/23/2019   Cataract    Chest discomfort 05/12/2019   CHF (congestive heart failure) (HCC)    Chronic kidney disease    one kidney that is healthy   Chronic left shoulder pain 02/05/2020   Diabetes mellitus due to underlying condition with unspecified complications (HCC) 05/12/2019   Essential hypertension 05/12/2019   Foot sprain, left, initial encounter 06/22/2019   Hyperlipidemia 02/17/2006   Qualifier: Diagnosis of  By: Michael Revere MD, Michael Guerra     LIVER FUNCTION TESTS, ABNORMAL 05/21/2006   Qualifier: Diagnosis of  By: Michael Revere MD, Michael Guerra     Mixed dyslipidemia 05/12/2019   Neck pain 08/17/2023   Other specified abnormal findings of blood chemistry 05/20/2011   Formatting of this note might be different from the original. Elevated liver function tests 10/1 IMO update   Rectal polyp 05/20/2011   Formatting of this note might be different from the original. Noted march 2011 fu in march 2016   Renal insufficiency 05/23/2019   Skin lesion of chest wall 05/20/2011   Formatting of this note might be different from the original. Mid chest (prob seb/epidermoid cyst)   Solitary kidney    Type 2 diabetes mellitus with hyperglycemia, without long-term current use of insulin (HCC) 02/17/2006   Qualifier: Diagnosis  of  By: Michael Revere MD, Michael Guerra      Past Surgical History:  Procedure Laterality Date   ABDOMINAL SURGERY     KIDNEY SURGERY     bowel obstruction  x 3   LEFT HEART CATH AND CORONARY ANGIOGRAPHY N/A 05/31/2019   Procedure: LEFT HEART CATH AND CORONARY ANGIOGRAPHY;  Surgeon: Michael Hamilton, MD;  Location: [facility];  Service: Cardiovascular;  Laterality: N/A;    Current Medications: Current Meds   Medication Sig   acetaminophen  (TYLENOL ) 500 MG tablet Take 1,000 mg by mouth every 6 (six) hours as needed for moderate pain or headache.   aspirin  81 MG tablet Take 81 mg by mouth daily.   atorvastatin  (LIPITOR) 40 MG tablet Take 1 tablet (40 mg total) by mouth daily.   benzonatate  (TESSALON ) 100 MG capsule Take 1 capsule (100 mg total) by mouth 3 (three) times daily as needed for cough.   Blood Glucose Monitoring Suppl (FREESTYLE LITE) w/Device KIT Use daily to check blood sugar   carvedilol  (COREG ) 25 MG tablet Take 1 tablet (25 mg total) by mouth 2 (two) times daily with a meal.   dapagliflozin  propanediol (FARXIGA ) 10 MG TABS tablet Take 1 tablet (10 mg total) by mouth daily.   fluticasone  (FLONASE ) 50 MCG/ACT nasal spray Place 2 sprays into both nostrils daily. (Patient taking differently: Place 2 sprays into both nostrils daily as needed.)   glucose blood (FREESTYLE LITE) test strip Use daily to check blood sugar   ipratropium (ATROVENT ) 0.03 % nasal spray Place 2 sprays into both nostrils every 12 (twelve) hours. (Patient taking differently: Place 2 sprays into both nostrils every 12 (twelve) hours. As needed)   Lactobacillus (PROBIOTIC ACIDOPHILUS PO) Take 1 tablet by mouth daily in the afternoon.   Lancets (FREESTYLE) lancets Use to check blood sugars daily   Melatonin 10 MG CAPS Take 20 mg by mouth at bedtime as needed for sleep (sleep).   Multiple Vitamins-Minerals (MULTIVITAMIN ADULT) TABS Take 1 tablet by mouth daily with lunch.    NEEDLE, DISP, 22 G (EASY TOUCH HYPODERMIC NEEDLE) 22G X 1-1/2" MISC Use as directed with testosterone .   NEEDLE, DISP, 23 G (BD DISP NEEDLE) 23G X 1" MISC Use as directed with testosterone .   Omega-3 Fatty Acids (FISH OIL) 1000 MG CAPS Take 1,000 mg by mouth daily.   sacubitril -valsartan  (ENTRESTO ) 97-103 MG Take 1 tablet by mouth 2 (two) times daily.   spironolactone  (ALDACTONE ) 25 MG tablet Take 0.5 tablets (12.5 mg total) by mouth daily. Patient  needs appointment for further refills. 1 st attempt   SYRINGE-NEEDLE, DISP, 3 ML (B-D 3CC LUER-LOK SYR 18GX1-1/2) 18G X 1-1/2" 3 ML MISC Use as directed with testosterone .   testosterone  cypionate (DEPOTESTOSTERONE CYPIONATE) 200 MG/ML injection Inject 0.5 mLs (100 mg total) into the muscle once a week. (Patient taking differently: Inject 100 mg into the muscle once a week. Wednesdays)   tirzepatide  (MOUNJARO ) 10 MG/0.5ML Pen Inject 10 mg into the skin once a week. (Patient taking differently: Inject 10 mg into the skin once a week. Takes on Mondays)   triamcinolone  ointment (KENALOG ) 0.1 % Apply 1 application topically 2 (two) times daily as needed for rash.     Allergies:   Lisinopril   Social History   Socioeconomic History   Marital status: Married    Spouse name: Not on file   Number of children: Not on file   Years of education: Not on file   Highest education level: Not  on file  Occupational History   Not on file  Tobacco Use   Smoking status: Never   Smokeless tobacco: Never  Vaping Use   Vaping status: Never Used  Substance and Sexual Activity   Alcohol use: No   Drug use: No   Sexual activity: Not on file  Other Topics Concern   Not on file  Social History Narrative   Not on file   Social Drivers of Health   Financial Resource Strain: Not on file  Food Insecurity: Not on file  Transportation Needs: Not on file  Physical Activity: Not on file  Stress: Not on file  Social Connections: Not on file     Family History: The patient's family history is negative for Cancer, Colon cancer, Esophageal cancer, Rectal cancer, and Stomach cancer.  ROS:   Please see the history of present illness.    All other systems reviewed and are negative.  EKGs/Labs/Other Studies Reviewed:    The following studies were reviewed today: I discussed my findings with the patient at length. ..      Recent Labs: 06/04/2023: ALT 38; BUN 20; Creatinine, Ser 1.45; Hemoglobin 16.8;  Platelets 202.0; Potassium 4.7; Sodium 139  Recent Lipid Panel    Component Value Date/Time   CHOL 122 06/04/2023 1035   CHOL 130 02/17/2021 0855   TRIG 76.0 06/04/2023 1035   HDL 46.50 06/04/2023 1035   HDL 48 02/17/2021 0855   CHOLHDL 3 06/04/2023 1035   VLDL 15.2 06/04/2023 1035   LDLCALC 60 06/04/2023 1035   LDLCALC 38 03/11/2022 1509   LDLDIRECT 97.0 10/18/2017 0941    Physical Exam:    VS:  BP 122/70   Pulse 83   Ht 6\' 1"  (1.854 m)   Wt 194 lb (88 kg)   SpO2 97%   BMI 25.60 kg/m     Wt Readings from Last 3 Encounters:  08/18/23 194 lb (88 kg)  08/05/23 193 lb 12.8 oz (87.9 kg)  06/04/23 196 lb (88.9 kg)     GEN: Patient is in no acute distress HEENT: Normal NECK: No JVD; No carotid bruits LYMPHATICS: No lymphadenopathy CARDIAC: Hear sounds regular, 2/6 systolic murmur at the apex. RESPIRATORY:  Clear to auscultation without rales, wheezing or rhonchi  ABDOMEN: Soft, non-tender, non-distended MUSCULOSKELETAL:  No edema; No deformity  SKIN: Warm and dry NEUROLOGIC:  Alert and oriented x 3 PSYCHIATRIC:  Normal affect   Signed, Michael Balzarine, MD  08/18/2023 9:13 AM     [facility]

## 2023-08-26 DIAGNOSIS — M542 Cervicalgia: Secondary | ICD-10-CM | POA: Diagnosis not present

## 2023-08-27 ENCOUNTER — Ambulatory Visit: Admitting: Behavioral Health

## 2023-08-27 ENCOUNTER — Other Ambulatory Visit: Payer: Self-pay

## 2023-08-27 ENCOUNTER — Encounter: Payer: Self-pay | Admitting: Behavioral Health

## 2023-08-27 DIAGNOSIS — F411 Generalized anxiety disorder: Secondary | ICD-10-CM | POA: Diagnosis not present

## 2023-08-27 NOTE — Progress Notes (Signed)
 [facility] Counselor/Therapist Progress Note  Patient ID: Michael Guerra, MRN: [id number],    Date: 08/27/2023  Time Spent: 48 minutes, 11 AM until 11:48 AM.  This session was held via video teletherapy. The patient consented to the video teletherapy and was located in his home during this session. He is aware it is the responsibility of the patient to secure confidentiality on her end of the session. The provider was in a private home office for the duration of this session  Treatment Type: Individual Therapy  Reported Symptoms: stress  Mental Status Exam: Appearance:  Well Groomed     Behavior: Appropriate  Motor: Normal  Speech/Language:  Normal Rate  Affect: Appropriate  Mood: normal  Thought process: normal  Thought content:   normal  Sensory/Perceptual disturbances:   WNL  Orientation: oriented to person, place, time/date, situation, day of week, month of year, and year  Attention: Good  Concentration: Good  Memory: WNL  Fund of knowledge:  Good  Insight:   Good  Judgment:  Good  Impulse Control: Good   Risk Assessment: Danger to Self:  No Self-injurious Behavior: No Danger to Others: No Duty to Warn:no Physical Aggression / Violence:No  Access to Firearms a concern: No  Gang Involvement:No   Subjective: The patient is tired.  They are short handed at work and people are having to pick up extra shifts.  He still feels that he is maintaining a good work life balance but continues to look for work opportunities otherwise where he does not have to work such varying shifts and pick up extra shifts.  He did get some good news on his neck and then it is not necessarily anything that would require surgery but although there is some arthritis in the cervical disk area was probably activated by pulled muscle or ligament in his neck.  He continues to work on improved communication including emotional intelligence and emotional expression.  We spent most of the  session looking at his history in terms of how he was told emotional recognition and expression how he uses that now and how we can work on improving that in turn communication with his wife. The patient does contract for safety having no thoughts of hurting himself or anyone else. He feels that as he is doing fairly well and can spread appointments out so we will meet again in about 2-3 weeks.  Interventions: Cognitive Behavioral Therapy  Diagnosis: Generalized anxiety disorder  Plan: I will meet with the patient every 2 to 3 weeks via care agility.  Treatment plan: Goals are to reduce his anxiety/stress level by at least 50% with a target date of October  31st, 2025.  Goals will include helping the patient to improve his ability to manage anxiety symptoms and better handle stress especially in the workplace.  Identify causes for anxiety and explore ways to lower it, resolve any core conflicts contributing to causes with anxiety as well as helping him manage thoughts and worrisome thinking contributing to anxiety.  Interventions include providing education about anxiety, facilitate and problem solution skills as well as coping skills to manage anxiety symptoms.  We will also use cognitive behavioral therapy to identify and change anxiety provoking thoughts and behavior patterns as well as dialectical behavior therapy to teach distress tolerance and mindfulness skills.  Progress 40%  Cecile Coder, [facility]                  Cecile Coder, [facility]  Cecile Coder, [facility]               Cecile Coder, [facility]               Cecile Coder, [facility]               Cecile Coder, [facility]               Cecile Coder, [facility]               Cecile Coder, [facility]               Cecile Coder, [facility]

## 2023-08-31 ENCOUNTER — Other Ambulatory Visit (HOSPITAL_BASED_OUTPATIENT_CLINIC_OR_DEPARTMENT_OTHER): Payer: Self-pay

## 2023-08-31 ENCOUNTER — Telehealth: Payer: Self-pay

## 2023-08-31 DIAGNOSIS — Z7189 Other specified counseling: Secondary | ICD-10-CM | POA: Diagnosis not present

## 2023-08-31 NOTE — Telephone Encounter (Signed)
 Pharmacy Patient Advocate Encounter   Received notification from CoverMyMeds that prior authorization for Mounjaro  12.5MG /0.5ML auto-injectors  is required/requested. Insurance verification completed.    The patient is insured through [facility]   Information regarding your request Member has an active PA on file which is expiring on 09/10/2023.   NO PA IS NEEDED AT THIS TIME WE CAN RENEW ON OR AFTER 09/10/2023 .   Per test claim: PA required; PA started via CoverMyMeds. KEY [id number] .

## 2023-09-15 ENCOUNTER — Other Ambulatory Visit (HOSPITAL_COMMUNITY): Payer: Self-pay

## 2023-09-15 ENCOUNTER — Other Ambulatory Visit: Payer: Self-pay | Admitting: Cardiology

## 2023-09-15 ENCOUNTER — Other Ambulatory Visit: Payer: Self-pay

## 2023-09-15 ENCOUNTER — Other Ambulatory Visit (HOSPITAL_COMMUNITY): Payer: Self-pay | Admitting: Cardiology

## 2023-09-15 DIAGNOSIS — I255 Ischemic cardiomyopathy: Secondary | ICD-10-CM

## 2023-09-15 DIAGNOSIS — I10 Essential (primary) hypertension: Secondary | ICD-10-CM

## 2023-09-15 MED ORDER — SPIRONOLACTONE 25 MG PO TABS
12.5000 mg | ORAL_TABLET | Freq: Every day | ORAL | 3 refills | Status: AC
Start: 2023-09-15 — End: ?
  Filled 2023-09-15: qty 45, 90d supply, fill #0

## 2023-09-15 NOTE — Telephone Encounter (Signed)
 RX sent

## 2023-09-16 ENCOUNTER — Other Ambulatory Visit: Payer: Self-pay

## 2023-09-17 ENCOUNTER — Other Ambulatory Visit (HOSPITAL_COMMUNITY): Payer: Self-pay

## 2023-09-17 ENCOUNTER — Other Ambulatory Visit: Payer: Self-pay

## 2023-09-17 MED ORDER — CARVEDILOL 25 MG PO TABS
25.0000 mg | ORAL_TABLET | Freq: Two times a day (BID) | ORAL | 0 refills | Status: DC
  Filled 2023-09-17: qty 60, 30d supply, fill #0

## 2023-09-22 ENCOUNTER — Other Ambulatory Visit: Payer: Self-pay

## 2023-09-22 ENCOUNTER — Other Ambulatory Visit (HOSPITAL_BASED_OUTPATIENT_CLINIC_OR_DEPARTMENT_OTHER): Payer: Self-pay

## 2023-09-22 ENCOUNTER — Other Ambulatory Visit: Payer: Self-pay | Admitting: Urology

## 2023-09-22 MED ORDER — SYRINGE/NEEDLE (DISP) 18G X 1-1/2" 3 ML MISC
1.0000 | 1 refills | Status: DC
  Filled 2023-09-22: qty 4, 28d supply, fill #0
  Filled 2023-10-21 – 2023-10-27 (×2): qty 4, 28d supply, fill #1

## 2023-09-23 ENCOUNTER — Other Ambulatory Visit (HOSPITAL_BASED_OUTPATIENT_CLINIC_OR_DEPARTMENT_OTHER): Payer: Self-pay

## 2023-09-23 MED ORDER — TESTOSTERONE CYPIONATE 200 MG/ML IM SOLN
INTRAMUSCULAR | 1 refills | Status: AC
  Filled 2023-09-23: qty 4, 28d supply, fill #0
  Filled 2023-12-29: qty 4, 28d supply, fill #1
  Filled 2024-03-10 (×2): qty 4, 28d supply, fill #2
  Filled 2024-03-10: qty 2, 28d supply, fill #2

## 2023-09-29 ENCOUNTER — Telehealth: Admitting: Physician Assistant

## 2023-09-29 ENCOUNTER — Other Ambulatory Visit (HOSPITAL_BASED_OUTPATIENT_CLINIC_OR_DEPARTMENT_OTHER): Payer: Self-pay

## 2023-09-29 DIAGNOSIS — B9689 Other specified bacterial agents as the cause of diseases classified elsewhere: Secondary | ICD-10-CM

## 2023-09-29 DIAGNOSIS — J019 Acute sinusitis, unspecified: Secondary | ICD-10-CM

## 2023-09-29 MED ORDER — AMOXICILLIN-POT CLAVULANATE 875-125 MG PO TABS
1.0000 | ORAL_TABLET | Freq: Two times a day (BID) | ORAL | 0 refills | Status: DC
  Filled 2023-09-29: qty 14, 7d supply, fill #0

## 2023-09-29 NOTE — Progress Notes (Signed)

## 2023-10-06 ENCOUNTER — Other Ambulatory Visit (HOSPITAL_BASED_OUTPATIENT_CLINIC_OR_DEPARTMENT_OTHER): Payer: Self-pay

## 2023-10-07 ENCOUNTER — Encounter: Payer: Self-pay | Admitting: Behavioral Health

## 2023-10-07 ENCOUNTER — Ambulatory Visit (INDEPENDENT_AMBULATORY_CARE_PROVIDER_SITE_OTHER): Admitting: Behavioral Health

## 2023-10-07 ENCOUNTER — Other Ambulatory Visit (HOSPITAL_BASED_OUTPATIENT_CLINIC_OR_DEPARTMENT_OTHER): Payer: Self-pay

## 2023-10-07 DIAGNOSIS — F411 Generalized anxiety disorder: Secondary | ICD-10-CM | POA: Diagnosis not present

## 2023-10-07 NOTE — Progress Notes (Signed)
 [facility] Counselor/Therapist Progress Note  Patient ID: Michael Guerra, MRN: [id number],    Date: 10/07/2023  Time Spent: 47 minutes, 11:08 AM until 1155 AM.  This session was held via video teletherapy. The patient consented to the video teletherapy and was located in his home during this session. He is aware it is the responsibility of the patient to secure confidentiality on her end of the session. The provider was in a private home office for the duration of this session  Treatment Type: Individual Therapy  Reported Symptoms: stress  Mental Status Exam: Appearance:  Well Groomed     Behavior: Appropriate  Motor: Normal  Speech/Language:  Normal Rate  Affect: Appropriate  Mood: normal  Thought process: normal  Thought content:   normal  Sensory/Perceptual disturbances:   WNL  Orientation: oriented to person, place, time/date, situation, day of week, month of year, and year  Attention: Good  Concentration: Good  Memory: WNL  Fund of knowledge:  Good  Insight:   Good  Judgment:  Good  Impulse Control: Good   Risk Assessment: Danger to Self:  No Self-injurious Behavior: No Danger to Others: No Duty to Warn:no Physical Aggression / Violence:No  Access to Firearms a concern: No  Gang Involvement:No   Subjective: The patient is getting over a respiratory infection which is the second time he has had one this year.  He is feeling better and just finished antibiotics but still does not have much energy and will go back to work in a few days.  He recognizes will be a lot to do when he gets there.  Just before getting sick he went to [place]  with his family because his wife had a conference there.  He was good to get away as a family.  Work is still fairly stressful but he has applied for a position within the same organization.  It would be a somewhat lateral move but would bring more income and a more steady schedule working from most days 8-5.  He would have to  travel a little bit father but would benefit from the stability of schedule and that would help his family.  His wife's business is going and that takes a lot of her time so he is spending more time interacting and taking care of his daughter which he enjoys.  Having a structured schedule would make that he is here.  As her business is going she is trying to manage her time as well too.  He says that does limit their time together so took it a few minutes to look at the intentional communication and awareness of each other and relationship. He feels that as he is doing fairly well and can spread appointments out so we will meet again in about 2-3 weeks.  Interventions: Cognitive Behavioral Therapy  Diagnosis: Generalized anxiety disorder  Plan: I will meet with the patient every 2 to 3 weeks via care agility.  Treatment plan: Goals are to reduce his anxiety/stress level by at least 50% with a target date of October  31st, 2025.  Goals will include helping the patient to improve his ability to manage anxiety symptoms and better handle stress especially in the workplace.  Identify causes for anxiety and explore ways to lower it, resolve any core conflicts contributing to causes with anxiety as well as helping him manage thoughts and worrisome thinking contributing to anxiety.  Interventions include providing education about anxiety, facilitate and problem solution skills as well as coping  skills to manage anxiety symptoms.  We will also use cognitive behavioral therapy to identify and change anxiety provoking thoughts and behavior patterns as well as dialectical behavior therapy to teach distress tolerance and mindfulness skills.  Progress 40%  Cecile Coder, [facility]                  Cecile Coder, [facility]               Cecile Coder, [facility]               Cecile Coder, [facility]               Cecile Coder,  [facility]               Cecile Coder, [facility]               Cecile Coder, [facility]               Cecile Coder, [facility]               Cecile Coder, [facility]               Cecile Coder, [facility]

## 2023-10-21 ENCOUNTER — Other Ambulatory Visit: Payer: Self-pay

## 2023-10-21 ENCOUNTER — Other Ambulatory Visit (HOSPITAL_COMMUNITY): Payer: Self-pay

## 2023-10-21 ENCOUNTER — Other Ambulatory Visit (HOSPITAL_COMMUNITY): Payer: Self-pay | Admitting: Cardiology

## 2023-10-21 ENCOUNTER — Other Ambulatory Visit (HOSPITAL_BASED_OUTPATIENT_CLINIC_OR_DEPARTMENT_OTHER): Payer: Self-pay

## 2023-10-21 MED ORDER — NEEDLE (DISP) 22G X 1-1/2" MISC
1 refills | Status: DC
  Filled 2023-10-21 – 2023-10-27 (×2): qty 4, 28d supply, fill #0
  Filled 2023-11-24: qty 4, 28d supply, fill #1

## 2023-10-21 MED ORDER — CARVEDILOL 25 MG PO TABS
25.0000 mg | ORAL_TABLET | Freq: Two times a day (BID) | ORAL | 0 refills | Status: DC
  Filled 2023-10-21 – 2023-10-27 (×2): qty 180, 90d supply, fill #0

## 2023-10-26 DIAGNOSIS — Z7189 Other specified counseling: Secondary | ICD-10-CM | POA: Diagnosis not present

## 2023-10-27 ENCOUNTER — Other Ambulatory Visit (HOSPITAL_BASED_OUTPATIENT_CLINIC_OR_DEPARTMENT_OTHER): Payer: Self-pay

## 2023-10-27 ENCOUNTER — Other Ambulatory Visit: Payer: Self-pay

## 2023-11-04 ENCOUNTER — Other Ambulatory Visit: Payer: Self-pay

## 2023-11-04 ENCOUNTER — Other Ambulatory Visit: Payer: Self-pay | Admitting: Family Medicine

## 2023-11-04 ENCOUNTER — Other Ambulatory Visit (HOSPITAL_BASED_OUTPATIENT_CLINIC_OR_DEPARTMENT_OTHER): Payer: Self-pay

## 2023-11-04 MED ORDER — DAPAGLIFLOZIN PROPANEDIOL 10 MG PO TABS
10.0000 mg | ORAL_TABLET | Freq: Every day | ORAL | 0 refills | Status: DC
  Filled 2023-11-04: qty 30, 30d supply, fill #0

## 2023-11-09 DIAGNOSIS — Z7189 Other specified counseling: Secondary | ICD-10-CM | POA: Diagnosis not present

## 2023-11-10 ENCOUNTER — Ambulatory Visit (INDEPENDENT_AMBULATORY_CARE_PROVIDER_SITE_OTHER): Admitting: Behavioral Health

## 2023-11-10 ENCOUNTER — Encounter: Payer: Self-pay | Admitting: Behavioral Health

## 2023-11-10 DIAGNOSIS — F411 Generalized anxiety disorder: Secondary | ICD-10-CM

## 2023-11-10 NOTE — Progress Notes (Signed)
 [facility] Counselor/Therapist Progress Note  Patient ID: Michael Guerra, MRN: [id number],    Date: 11/10/2023  Time Spent: 9 AM until 9:58 AM, 58 minutes.  From 9 AM until 9:13 AM we attempted to do a video telehealth visit but he did not have any sound so at 913 we switched to a telephone visit.  This session was held via video teletherapy. The patient consented to the video teletherapy and was located in his home during this session. He is aware it is the responsibility of the patient to secure confidentiality on her end of the session. The provider was in a private home office for the duration of this session  Treatment Type: Individual Therapy  Reported Symptoms: stress  Mental Status Exam: Appearance:  Well Groomed     Behavior: Appropriate  Motor: Normal  Speech/Language:  Normal Rate  Affect: Appropriate  Mood: normal  Thought process: normal  Thought content:   normal  Sensory/Perceptual disturbances:   WNL  Orientation: oriented to person, place, time/date, situation, day of week, month of year, and year  Attention: Good  Concentration: Good  Memory: WNL  Fund of knowledge:  Good  Insight:   Good  Judgment:  Good  Impulse Control: Good   Risk Assessment: Danger to Self:  No Self-injurious Behavior: No Danger to Others: No Duty to Warn:no Physical Aggression / Violence:No  Access to Firearms a concern: No  Gang Involvement:No   Subjective: The patient reports that he and his wife have started going to couples therapy with a [place] therapist and there were some things that she brought up in the previous session which called him off guard and he does not fully understand.  The therapist is looking what each of them might need in the relationship that would strengthen it and his wife made a comment about feeling invisible.  He did not understand what that meant.  He acknowledges that his wife does a lot around the house because she works from home but  also has a very busy job.  He feels that on his days off he is the primary caregiver for their daughter and that he does things around the house to but he is going to ask specifically what it is that she feels he is not doing.  I encouraged him to look beyond the physical things around the house to see if there may be more of an emotional or attention component to it.  We processed more of their relationship and what that has looked like over the years.  There were some things which he said maybe she would have been a red flag but worked around them best he could and there have been some situations recently which he does not understand.  She has a very busy job but wants to be able to do more public speaking and he does not mind supporting that but she does not have much time as it is.  He said it also has created some financial distress looking at what will take for her to fulfill that.  We started looking at boundaries in him being more aware of what is going on within their financial situation and how that might be affecting their communication also.  Work is also very stressful and that they are short handed and no overtime is being paid for.  He continues to look at other opportunities but has not heard anything back yet.  There was also an event that took place at  the hospital which was very troubling to him and we process more of that also.  We are going to attempt to meet more frequently and follow-up appointments are scheduled. He does contract for safety having no thoughts of hurting himself or anyone else. Interventions: Cognitive Behavioral Therapy  Diagnosis: Generalized anxiety disorder  Plan: I will meet with the patient every 2 to 3 weeks via care agility.  Treatment plan: Goals are to reduce his anxiety/stress level by at least 50% with a target date of October  31st, 2025.  Goals will include helping the patient to improve his ability to manage anxiety symptoms and better handle stress  especially in the workplace.  Identify causes for anxiety and explore ways to lower it, resolve any core conflicts contributing to causes with anxiety as well as helping him manage thoughts and worrisome thinking contributing to anxiety.  Interventions include providing education about anxiety, facilitate and problem solution skills as well as coping skills to manage anxiety symptoms.  We will also use cognitive behavioral therapy to identify and change anxiety provoking thoughts and behavior patterns as well as dialectical behavior therapy to teach distress tolerance and mindfulness skills.  Progress 40%  Lorrene CHRISTELLA Hasten, [facility]                  Lorrene CHRISTELLA Hasten, [facility]               Lorrene CHRISTELLA Hasten, [facility]               Lorrene CHRISTELLA Hasten, [facility]               Lorrene CHRISTELLA Hasten, [facility]               Lorrene CHRISTELLA Hasten, [facility]               Lorrene CHRISTELLA Hasten, [facility]               Lorrene CHRISTELLA Hasten, [facility]               Lorrene CHRISTELLA Hasten, [facility]               Lorrene CHRISTELLA Hasten, [facility]               Lorrene CHRISTELLA Hasten, [facility]

## 2023-11-23 DIAGNOSIS — Z7189 Other specified counseling: Secondary | ICD-10-CM | POA: Diagnosis not present

## 2023-11-24 ENCOUNTER — Other Ambulatory Visit (HOSPITAL_BASED_OUTPATIENT_CLINIC_OR_DEPARTMENT_OTHER): Payer: Self-pay

## 2023-11-24 MED ORDER — SYRINGE/NEEDLE (DISP) 18G X 1-1/2" 3 ML MISC
1.0000 | 1 refills | Status: AC
  Filled 2023-11-24: qty 4, 28d supply, fill #0
  Filled 2023-12-30: qty 4, 28d supply, fill #1

## 2023-12-01 ENCOUNTER — Other Ambulatory Visit (HOSPITAL_BASED_OUTPATIENT_CLINIC_OR_DEPARTMENT_OTHER): Payer: Self-pay

## 2023-12-02 ENCOUNTER — Ambulatory Visit (INDEPENDENT_AMBULATORY_CARE_PROVIDER_SITE_OTHER): Admitting: Behavioral Health

## 2023-12-02 DIAGNOSIS — F411 Generalized anxiety disorder: Secondary | ICD-10-CM

## 2023-12-02 NOTE — Progress Notes (Addendum)
 [facility] Counselor/Therapist Progress Note  Patient ID: Michael Guerra, MRN: [id number],    Date: 12/02/2023  Time Spent: 4:11 PM until 5:07 PM, 56 minutes. We attempted to do a video telehealth visit but he did not have any sound so at 4:13 we switched to a telephone visit.  This session was held via video teletherapy. The patient consented to the video teletherapy and was located in his home during this session. He is aware it is the responsibility of the patient to secure confidentiality on her end of the session. The provider was in a private home office for the duration of this session  Treatment Type: Individual Therapy  Reported Symptoms: stress  Mental Status Exam: Appearance:  Well Groomed     Behavior: Appropriate  Motor: Normal  Speech/Language:  Normal Rate  Affect: Appropriate  Mood: normal  Thought process: normal  Thought content:   normal  Sensory/Perceptual disturbances:   WNL  Orientation: oriented to person, place, time/date, situation, day of week, month of year, and year  Attention: Good  Concentration: Good  Memory: WNL  Fund of knowledge:  Good  Insight:   Good  Judgment:  Good  Impulse Control: Good   Risk Assessment: Danger to Self:  No Self-injurious Behavior: No Danger to Others: No Duty to Warn:no Physical Aggression / Violence:No  Access to Firearms a concern: No  Gang Involvement:No   Subjective: The patient job situation is becoming more stressful.  They are shorthanded and the company is not paying over time.  2 positions he was interested in he found out during the interview that required a bachelor's degree so he did not qualify.  He is looking at going to get a BA in something basic.  If he continues to attend marital therapy and work on how that can strengthen their marriage.  There were things that his wife did request 1 of which includes vocational direction and he thinks the other 2 are attainable.  The therapist asked  what he would like to see changed and in the moment he could not think of anything so we processed that in session 1 of which was access to the family finances.  We also started to look at what made their marriage strong in the beginning of what has he seen changes that he would like to see them return to.  He does contract for safety having no thoughts of hurting himself or anyone else. Interventions: Cognitive Behavioral Therapy  Diagnosis: Generalized anxiety disorder  Plan: I will meet with the patient every 2 to 3 weeks via care agility.  Treatment plan: Goals are to reduce his anxiety/stress level by at least 50% with a target date of October  31st, 2025.  Goals will include helping the patient to improve his ability to manage anxiety symptoms and better handle stress especially in the workplace.  Identify causes for anxiety and explore ways to lower it, resolve any core conflicts contributing to causes with anxiety as well as helping him manage thoughts and worrisome thinking contributing to anxiety.  Interventions include providing education about anxiety, facilitate and problem solution skills as well as coping skills to manage anxiety symptoms.  We will also use cognitive behavioral therapy to identify and change anxiety provoking thoughts and behavior patterns as well as dialectical behavior therapy to teach distress tolerance and mindfulness skills.  Progress 40%  Lorrene CHRISTELLA Hasten, [facility]  Lorrene CHRISTELLA Hasten, [facility]               Lorrene CHRISTELLA Hasten, [facility]               Lorrene CHRISTELLA Hasten, [facility]               Lorrene CHRISTELLA Hasten, [facility]               Lorrene CHRISTELLA Hasten, [facility]               Lorrene CHRISTELLA Hasten, [facility]               Lorrene CHRISTELLA Hasten, [facility]               Lorrene CHRISTELLA Hasten, [facility]               Lorrene CHRISTELLA Hasten, [facility]               Lorrene CHRISTELLA Hasten,  [facility]               Lorrene CHRISTELLA Hasten, [facility]

## 2023-12-03 ENCOUNTER — Other Ambulatory Visit: Payer: Self-pay

## 2023-12-03 ENCOUNTER — Other Ambulatory Visit (HOSPITAL_BASED_OUTPATIENT_CLINIC_OR_DEPARTMENT_OTHER): Payer: Self-pay

## 2023-12-03 ENCOUNTER — Encounter: Payer: Self-pay | Admitting: Family Medicine

## 2023-12-03 ENCOUNTER — Ambulatory Visit: Payer: Self-pay | Admitting: Family Medicine

## 2023-12-03 ENCOUNTER — Ambulatory Visit: Payer: Commercial Managed Care - PPO | Admitting: Family Medicine

## 2023-12-03 VITALS — BP 120/78 | HR 71 | Temp 98.0°F | Resp 18 | Ht 73.0 in | Wt 194.0 lb

## 2023-12-03 DIAGNOSIS — R7401 Elevation of levels of liver transaminase levels: Secondary | ICD-10-CM

## 2023-12-03 DIAGNOSIS — Z7985 Long-term (current) use of injectable non-insulin antidiabetic drugs: Secondary | ICD-10-CM | POA: Diagnosis not present

## 2023-12-03 DIAGNOSIS — E782 Mixed hyperlipidemia: Secondary | ICD-10-CM | POA: Diagnosis not present

## 2023-12-03 DIAGNOSIS — E1165 Type 2 diabetes mellitus with hyperglycemia: Secondary | ICD-10-CM | POA: Diagnosis not present

## 2023-12-03 LAB — COMPREHENSIVE METABOLIC PANEL WITH GFR
ALT: 27 U/L (ref 0–53)
AST: 40 U/L — ABNORMAL HIGH (ref 0–37)
Albumin: 4.5 g/dL (ref 3.5–5.2)
Alkaline Phosphatase: 59 U/L (ref 39–117)
BUN: 10 mg/dL (ref 6–23)
CO2: 31 meq/L (ref 19–32)
Calcium: 9.7 mg/dL (ref 8.4–10.5)
Chloride: 101 meq/L (ref 96–112)
Creatinine, Ser: 1.43 mg/dL (ref 0.40–1.50)
GFR: 51.4 mL/min — ABNORMAL LOW (ref >60.00)
Glucose, Bld: 80 mg/dL (ref 70–99)
Potassium: 4.8 meq/L (ref 3.5–5.1)
Sodium: 137 meq/L (ref 135–145)
Total Bilirubin: 3.3 mg/dL — ABNORMAL HIGH (ref 0.2–1.2)
Total Protein: 7.2 g/dL (ref 6.0–8.3)

## 2023-12-03 LAB — LIPID PANEL
Cholesterol: 89 mg/dL (ref 0–200)
HDL: 41.2 mg/dL (ref >39.00)
LDL Cholesterol: 33 mg/dL (ref 0–99)
NonHDL: 47.44
Total CHOL/HDL Ratio: 2
Triglycerides: 74 mg/dL (ref 0.0–149.0)
VLDL: 14.8 mg/dL (ref 0.0–40.0)

## 2023-12-03 LAB — HEMOGLOBIN A1C: Hgb A1c MFr Bld: 6.4 % (ref 4.6–6.5)

## 2023-12-03 MED ORDER — DAPAGLIFLOZIN PROPANEDIOL 10 MG PO TABS
10.0000 mg | ORAL_TABLET | Freq: Every day | ORAL | 2 refills | Status: AC
  Filled 2023-12-03: qty 30, 30d supply, fill #0
  Filled 2024-01-13: qty 30, 30d supply, fill #1
  Filled 2024-02-11: qty 30, 30d supply, fill #2
  Filled 2024-03-10: qty 30, 30d supply, fill #3
  Filled 2024-03-10 – 2024-03-14 (×2): qty 30, 30d supply, fill #0
  Filled 2024-04-19: qty 30, 30d supply, fill #1

## 2023-12-03 NOTE — Progress Notes (Signed)
 Subjective:   Chief Complaint  Patient presents with   Follow-up    Michael Guerra is a 63 y.o. male here for follow-up of diabetes.   Michael Guerra does not routinely check his sugars.  Patient does not require insulin.   Medications include: Farxiga  10 mg/d, Mounjaro  10 mg/week Diet is healthy overall.  Exercise: walking, lifting wts, elliptical  Hyperlipidemia Patient presents for mixed hyperlipidemia follow up. Currently being treated with Lipitor 40 mg/d and compliance with treatment thus far has been good. He denies myalgias. Diet/exercise as above.  No Cp or SOB.  The patient is known to have coexisting coronary artery disease.  Past Medical History:  Diagnosis Date   CAD (coronary artery disease) 06/08/2019   Cardiomyopathy (HCC) 05/23/2019   Cataract    Chest discomfort 05/12/2019   CHF (congestive heart failure) (HCC)    Chronic kidney disease    one kidney that is healthy   Chronic left shoulder pain 02/05/2020   Diabetes mellitus due to underlying condition with unspecified complications (HCC) 05/12/2019   Essential hypertension 05/12/2019   Foot sprain, left, initial encounter 06/22/2019   Hyperlipidemia 02/17/2006   Qualifier: Diagnosis of  By: Neysa MD, [place]ia     LIVER FUNCTION TESTS, ABNORMAL 05/21/2006   Qualifier: Diagnosis of  By: Neysa MD, Venetia     Mixed dyslipidemia 05/12/2019   Neck pain 08/17/2023   Other specified abnormal findings of blood chemistry 05/20/2011   Formatting of this note might be different from the original. Elevated liver function tests 10/1 IMO update   Rectal polyp 05/20/2011   Formatting of this note might be different from the original. Noted march 2011 fu in march 2016   Renal insufficiency 05/23/2019   Skin lesion of chest wall 05/20/2011   Formatting of this note might be different from the original. Mid chest (prob seb/epidermoid cyst)   Solitary kidney    Type 2 diabetes mellitus with hyperglycemia, without long-term current  use of insulin (HCC) 02/17/2006   Qualifier: Diagnosis of  By: Neysa MD, [place]ia       Related testing: Retinal exam: Due Pneumovax: done  Objective:  BP 120/78   Pulse 71   Temp 98 F (36.7 C)   Resp 18   Ht 6' 1 (1.854 m)   Wt 194 lb (88 kg)   SpO2 99%   BMI 25.60 kg/m  General:  Well developed, well nourished, in no apparent distress Skin:  Warm, no pallor or diaphoresis Head:  Normocephalic, atraumatic Eyes:  Pupils equal and round, sclera anicteric without injection  Lungs:  CTAB, no access msc use Cardio:  RRR, no bruits, no LE edema Musculoskeletal:  Symmetrical muscle groups noted without atrophy or deformity Neuro:  Sensation intact to pinprick on feet Psych: Age appropriate judgment and insight  Assessment:   Type 2 diabetes mellitus with hyperglycemia, without long-term current use of insulin (HCC)  Essential hypertension   Plan:   Chronic, stable. Cont Farxiga  10 mg/d, Mounjaro  10 mg/week. Counseled on diet and exercise. Chronic, stable. Cont Lipitor 40 mg/d.  [facility] # provided. They have reached out to him as he is due for CCS.  F/u in 6 mo. The patient voiced understanding and agreement to the plan.  Mabel [place], DO 12/03/23 9:37 AM

## 2023-12-03 NOTE — Patient Instructions (Signed)
Give [place] 2-3 business days to get the results of your labs back.   Keep the diet clean and stay active.  Please contact the GI team at: [phone]  Let us know if you need anything.

## 2023-12-07 DIAGNOSIS — Z7189 Other specified counseling: Secondary | ICD-10-CM | POA: Diagnosis not present

## 2023-12-10 ENCOUNTER — Ambulatory Visit (HOSPITAL_BASED_OUTPATIENT_CLINIC_OR_DEPARTMENT_OTHER)
Admission: RE | Admit: 2023-12-10 | Discharge: 2023-12-10 | Disposition: A | Discharge status: Home / Self Care | Source: Ambulatory Visit | Attending: Family Medicine | Admitting: Family Medicine

## 2023-12-10 DIAGNOSIS — R7401 Elevation of levels of liver transaminase levels: Secondary | ICD-10-CM | POA: Insufficient documentation

## 2023-12-10 DIAGNOSIS — R7989 Other specified abnormal findings of blood chemistry: Secondary | ICD-10-CM | POA: Diagnosis not present

## 2023-12-14 ENCOUNTER — Ambulatory Visit: Payer: Self-pay | Admitting: Family Medicine

## 2023-12-15 ENCOUNTER — Ambulatory Visit (INDEPENDENT_AMBULATORY_CARE_PROVIDER_SITE_OTHER): Admitting: Behavioral Health

## 2023-12-15 ENCOUNTER — Encounter: Payer: Self-pay | Admitting: Behavioral Health

## 2023-12-15 DIAGNOSIS — F411 Generalized anxiety disorder: Secondary | ICD-10-CM | POA: Diagnosis not present

## 2023-12-15 NOTE — Progress Notes (Signed)
 [facility] Counselor/Therapist Progress Note  Patient ID: Michael Guerra, MRN: [id number],    Date: 12/15/2023  Time Spent: 1:07 PM until 1:33 PM, 26 minutes this session was held via p.o. teletherapy.  The patient was not able to connect via video.  The patient consented to the audio teletherapy and was located in hiscar during this session. He is aware it is the responsibility of the patient to secure confidentiality on her end of the session. The provider was in a private home office for the duration of this session  Treatment Type: Individual Therapy  Reported Symptoms: stress  Mental Status Exam: Appearance:  Well Groomed     Behavior: Appropriate  Motor: Normal  Speech/Language:  Normal Rate  Affect: Appropriate  Mood: normal  Thought process: normal  Thought content:   normal  Sensory/Perceptual disturbances:   WNL  Orientation: oriented to person, place, time/date, situation, day of week, month of year, and year  Attention: Good  Concentration: Good  Memory: WNL  Fund of knowledge:  Good  Insight:   Good  Judgment:  Good  Impulse Control: Good   Risk Assessment: Danger to Self:  No Self-injurious Behavior: No Danger to Others: No Duty to Warn:no Physical Aggression / Violence:No  Access to Firearms a concern: No  Gang Involvement:No   Subjective: The patient reports that work has settled down a little bit but he does not see acute changes much as the new company is restricting any overtime or adding anyone else currently.  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  Going to speak to his supervisor about the possibility of the employer providing financial assistance if he wants to return to school.  He did have a slight scare and that during a particular exam they thought they saw a spot  on his liver but upon testing did not see anything.  He and his wife continue to do couples therapy and feels they are making a little headway.  Both a very busy schedule at work and taking care of their daughter so encouraged intentional time set aside both to work on what the therapist has prescribed but also to find some time for date nights.  He says he does not still fully understand and feels at times that he is not getting a clear picture of what expectations are.  I encouraged respectful direct communication.  He does contract for safety having no thoughts of hurting himself or anyone else. Interventions: Cognitive Behavioral Therapy  Diagnosis: Generalized anxiety disorder  Plan: I will meet with the patient every 2 to 3 weeks via care agility.  Treatment plan: Goals are to reduce his anxiety/stress level by at least 50% with a target date of October  31st, 2025.  Goals will include helping the patient to improve his ability to manage anxiety symptoms and better handle stress especially in the workplace.  Identify causes for anxiety and explore ways to lower it, resolve any core conflicts contributing to causes with anxiety as well as helping him manage thoughts and worrisome thinking contributing to anxiety.  Interventions include providing education about anxiety, facilitate and problem solution skills as well as coping skills to manage anxiety symptoms.  We will also use cognitive behavioral therapy to identify and change anxiety provoking thoughts and behavior patterns as  well as dialectical behavior therapy to teach distress tolerance and mindfulness skills.  Progress 40%  Lorrene CHRISTELLA Hasten, [facility]                  Lorrene CHRISTELLA Hasten, [facility]               Lorrene CHRISTELLA Hasten, [facility]               Lorrene CHRISTELLA Hasten, [facility]               Lorrene CHRISTELLA Hasten, [facility]               Lorrene CHRISTELLA Hasten, [facility]               Lorrene CHRISTELLA Hasten, [facility]               Lorrene CHRISTELLA Hasten, [facility]               Lorrene CHRISTELLA Hasten, [facility]               Lorrene CHRISTELLA Hasten, [facility]               Lorrene CHRISTELLA Hasten, [facility]               Lorrene CHRISTELLA Hasten, [facility]               Lorrene CHRISTELLA Hasten, [facility]

## 2023-12-16 ENCOUNTER — Ambulatory Visit: Admitting: Behavioral Health

## 2023-12-21 ENCOUNTER — Other Ambulatory Visit (HOSPITAL_BASED_OUTPATIENT_CLINIC_OR_DEPARTMENT_OTHER): Payer: Self-pay

## 2023-12-21 ENCOUNTER — Other Ambulatory Visit: Payer: Self-pay | Admitting: Family Medicine

## 2023-12-21 ENCOUNTER — Other Ambulatory Visit: Payer: Self-pay

## 2023-12-21 MED ORDER — MOUNJARO 10 MG/0.5ML ~~LOC~~ SOAJ
10.0000 mg | SUBCUTANEOUS | 2 refills | Status: AC
  Filled 2023-12-21: qty 2, 28d supply, fill #0
  Filled 2024-02-01: qty 2, 28d supply, fill #1
  Filled 2024-03-10 (×2): qty 2, 28d supply, fill #2
  Filled 2024-04-24: qty 2, 28d supply, fill #3

## 2023-12-22 ENCOUNTER — Other Ambulatory Visit (HOSPITAL_BASED_OUTPATIENT_CLINIC_OR_DEPARTMENT_OTHER): Payer: Self-pay

## 2023-12-22 ENCOUNTER — Other Ambulatory Visit: Payer: Self-pay

## 2023-12-23 DIAGNOSIS — E291 Testicular hypofunction: Secondary | ICD-10-CM | POA: Diagnosis not present

## 2023-12-29 ENCOUNTER — Other Ambulatory Visit (HOSPITAL_BASED_OUTPATIENT_CLINIC_OR_DEPARTMENT_OTHER): Payer: Self-pay

## 2023-12-30 ENCOUNTER — Other Ambulatory Visit: Payer: Self-pay

## 2023-12-30 ENCOUNTER — Other Ambulatory Visit (HOSPITAL_BASED_OUTPATIENT_CLINIC_OR_DEPARTMENT_OTHER): Payer: Self-pay

## 2023-12-30 DIAGNOSIS — N5201 Erectile dysfunction due to arterial insufficiency: Secondary | ICD-10-CM | POA: Diagnosis not present

## 2023-12-30 DIAGNOSIS — E291 Testicular hypofunction: Secondary | ICD-10-CM | POA: Diagnosis not present

## 2023-12-31 ENCOUNTER — Other Ambulatory Visit (HOSPITAL_BASED_OUTPATIENT_CLINIC_OR_DEPARTMENT_OTHER): Payer: Self-pay

## 2023-12-31 ENCOUNTER — Other Ambulatory Visit: Payer: Self-pay

## 2023-12-31 MED ORDER — NEEDLE (DISP) 22G X 1-1/2" MISC
1 refills | Status: AC
  Filled 2023-12-31: qty 4, 4d supply, fill #0
  Filled 2024-02-04: qty 4, 4d supply, fill #1

## 2024-01-04 DIAGNOSIS — Z7189 Other specified counseling: Secondary | ICD-10-CM | POA: Diagnosis not present

## 2024-01-12 ENCOUNTER — Ambulatory Visit (INDEPENDENT_AMBULATORY_CARE_PROVIDER_SITE_OTHER): Admitting: Behavioral Health

## 2024-01-12 ENCOUNTER — Encounter: Payer: Self-pay | Admitting: Behavioral Health

## 2024-01-12 DIAGNOSIS — F411 Generalized anxiety disorder: Secondary | ICD-10-CM

## 2024-01-12 NOTE — Progress Notes (Signed)
 Michael Guerra: Michael Guerra, MRN: [id number],    Date: 01/12/2024  Time Spent: 1:01 p.m. until 1:57 PM, 56 minutes this session was held via p.o. teletherapy.  The patient was not able to connect via video.  The patient consented to the audio teletherapy and was located in hiscar during this session. He is aware it is the responsibility of the patient to secure confidentiality on her end of the session. The provider was in a private home office for the duration of this session  Treatment Type: Individual Therapy  Reported Symptoms: stress  Mental Status Exam: Appearance:  Well Groomed     Behavior: Appropriate  Motor: Normal  Speech/Language:  Normal Rate  Affect: Appropriate  Mood: normal  Thought process: normal  Thought content:   normal  Sensory/Perceptual disturbances:   WNL  Orientation: oriented to person, place, time/date, situation, day of week, month of year, and year  Attention: Good  Concentration: Good  Memory: WNL  Fund of knowledge:  Good  Insight:   Good  Judgment:  Good  Impulse Control: Good   Risk Assessment: Danger to Self:  No Self-injurious Behavior: No Danger to Others: No Duty to Warn:no Physical Aggression / Violence:No  Access to Firearms a concern: No  Gang Involvement:No   Subjective: The patient started some medication today and will have a couple of days for himself until he is wife and daughter leaves to go to [place]  for a few days to see some family had to celebrate his daughters fourth birthday.  Work continues to be worked.  He said they have hired to Writer which she is thankful for but there is still restrictions in terms of how the organization see security as a priority per patient report.  He continues to do his job and doing well but is looking at other opportunities in addition to.  He continues to work on couples therapy and says that they are getting some  tools to work on in terms of strengthening the relationship and improving communication.  His wife has asked what he would like to see her doing differently to make the relationship better processed some of what he is thinking about it how he can present that.  We talked about the importance of mindfulness and being aware of what he is thinking and how he expresses those feelings.  He acknowledges that his wife is very clinical and professional and articulate.  He also says that she processes things faster than he does we talked about respectfully asking her to present smaller segments for them to process and talk about we also talked about him speaking to the couples therapy about emphasizing different styles of communication while incorporating the principles of the 5 level language.  He does contract for safety having no thoughts of hurting himself or anyone else. Interventions: Cognitive Behavioral Therapy  Diagnosis: Generalized anxiety disorder  Plan: I will meet with the patient every 2 to 3 weeks via care agility.  Treatment plan: Goals are to reduce his anxiety/stress level by at least 50% with a target date of October  31st, 2025.  Goals will include helping the patient to improve his ability to manage anxiety symptoms and better handle stress especially in the workplace.  Identify causes for anxiety and explore ways to lower it, resolve any core conflicts contributing to causes with anxiety as well as helping him manage thoughts and worrisome thinking contributing to anxiety.  Interventions  include providing education about anxiety, facilitate and problem solution skills as well as coping skills to manage anxiety symptoms.  We will also use cognitive behavioral therapy to identify and change anxiety provoking thoughts and behavior patterns as well as dialectical behavior therapy to teach distress tolerance and mindfulness skills.  Progress 40%  Lorrene CHRISTELLA Hasten,  [facility]                  Lorrene CHRISTELLA Hasten, [facility]               Lorrene CHRISTELLA Hasten, [facility]               Lorrene CHRISTELLA Hasten, [facility]               Lorrene CHRISTELLA Hasten, [facility]               Lorrene CHRISTELLA Hasten, [facility]               Lorrene CHRISTELLA Hasten, [facility]               Lorrene CHRISTELLA Hasten, [facility]               Lorrene CHRISTELLA Hasten, [facility]               Lorrene CHRISTELLA Hasten, [facility]               Lorrene CHRISTELLA Hasten, [facility]               Lorrene CHRISTELLA Hasten, [facility]               Lorrene CHRISTELLA Hasten, [facility]               Lorrene CHRISTELLA Hasten, [facility]

## 2024-01-31 ENCOUNTER — Encounter: Payer: Self-pay | Admitting: Physician Assistant

## 2024-01-31 ENCOUNTER — Telehealth: Admitting: Physician Assistant

## 2024-01-31 DIAGNOSIS — B349 Viral infection, unspecified: Secondary | ICD-10-CM | POA: Diagnosis not present

## 2024-01-31 NOTE — Progress Notes (Signed)
 Virtual Visit Consent   Michael Guerra, you are scheduled for a virtual visit with a [facility] provider today. Just as with appointments in the office, your consent must be obtained to participate. Your consent will be active for this visit and any virtual visit you may have with one of our providers in the next 365 days. If you have a MyChart account, a copy of this consent can be sent to you electronically.  As this is a virtual visit, video technology does not allow for your provider to perform a traditional examination. This may limit your provider's ability to fully assess your condition. If your provider identifies any concerns that need to be evaluated in person or the need to arrange testing (such as labs, EKG, etc.), we will make arrangements to do so. Although advances in technology are sophisticated, we cannot ensure that it will always work on either your end or our end. If the connection with a video visit is poor, the visit may have to be switched to a telephone visit. With either a video or telephone visit, we are not always able to ensure that we have a secure connection.  By engaging in this virtual visit, you consent to the provision of healthcare and authorize for your insurance to be billed (if applicable) for the services provided during this visit. Depending on your insurance coverage, you may receive a charge related to this service.  I need to obtain your verbal consent now. Are you willing to proceed with your visit today? Michael Guerra has provided verbal consent on 01/31/2024 for a virtual visit (video or telephone). Michael Guerra, [place]  Date: 01/31/2024 10:30 AM   Virtual Visit via Video Note   I, Michael Guerra, connected with  OLUWAFEMI VILLELLA  ([id number], 10-12-1960) on 01/31/24 at  7:45 AM EDT by a video-enabled telemedicine application and verified that I am speaking with the correct person using two identifiers.  Location: Patient: Virtual Visit  Location Patient: Home Provider: Virtual Visit Location Provider: Home Office   I discussed the limitations of evaluation and management by telemedicine and the availability of in person appointments. The patient expressed understanding and agreed to proceed.    History of Present Illness: Michael Guerra is a 63 y.o. who identifies as a male who was assigned male at birth, and is being seen today for symptoms starting last Friday into Saturday with chills and fatigue with low-grade fever (Tmax 100.7). Denies any cough or congestion. Denies stomach upset. Works at a hospital so unsure of exposures. Denies recent travel. Has been hydrating and resting, along with taking OTC Dayquil and Mucinex. Has not tested for Flu or COVID.   132/82 BP  128 HR  HPI: HPI  Problems:  Patient Active Problem List   Diagnosis Date Noted   Neck pain 08/17/2023   CHF (congestive heart failure) (HCC)    Cataract    Chronic kidney disease    Chronic left shoulder pain 02/05/2020   Foot sprain, left, initial encounter 06/22/2019   CAD (coronary artery disease) 06/08/2019   Cardiomyopathy (HCC) 05/23/2019   Solitary kidney 05/23/2019   Renal insufficiency 05/23/2019   Essential hypertension 05/12/2019   Mixed dyslipidemia 05/12/2019   Diabetes mellitus due to underlying condition with unspecified complications (HCC) 05/12/2019   Chest discomfort 05/12/2019   Rectal polyp 05/20/2011   Skin lesion of chest wall 05/20/2011   Other specified abnormal findings of blood chemistry 05/20/2011   LIVER FUNCTION TESTS,  ABNORMAL 05/21/2006   Type 2 diabetes mellitus with hyperglycemia, without long-term current use of insulin (HCC) 02/17/2006   Hyperlipidemia 02/17/2006    Allergies:  Allergies  Allergen Reactions   Lisinopril Cough   Medications:  Current Outpatient Medications:    aspirin  81 MG tablet, Take 81 mg by mouth daily., Disp: , Rfl:    atorvastatin  (LIPITOR) 40 MG tablet, Take 1 tablet (40 mg total)  by mouth daily., Disp: 90 tablet, Rfl: 1   Blood Glucose Monitoring Suppl (FREESTYLE LITE) w/Device KIT, Use daily to check blood sugar, Disp: 1 kit, Rfl: 0   carvedilol  (COREG ) 25 MG tablet, Take 1 tablet (25 mg total) by mouth 2 (two) times daily with a meal. NEEDS FOLLOW UP APPOINTMENT FOR MORE REFILLS, Disp: 180 tablet, Rfl: 0   dapagliflozin  propanediol (FARXIGA ) 10 MG TABS tablet, Take 1 tablet (10 mg total) by mouth daily., Disp: 90 tablet, Rfl: 2   fluticasone  (FLONASE ) 50 MCG/ACT nasal spray, Place 2 sprays into both nostrils daily. (Patient taking differently: Place 2 sprays into both nostrils daily as needed.), Disp: 16 g, Rfl: 6   glucose blood (FREESTYLE LITE) test strip, Use daily to check blood sugar, Disp: 100 each, Rfl: 3   ipratropium (ATROVENT ) 0.03 % nasal spray, Place 2 sprays into both nostrils every 12 (twelve) hours. (Patient taking differently: Place 2 sprays into both nostrils every 12 (twelve) hours. As needed), Disp: 30 mL, Rfl: 12   Lactobacillus (PROBIOTIC ACIDOPHILUS PO), Take 1 tablet by mouth daily in the afternoon., Disp: , Rfl:    Lancets (FREESTYLE) lancets, Use to check blood sugars daily, Disp: 100 each, Rfl: 3   Melatonin 10 MG CAPS, Take 20 mg by mouth at bedtime as needed for sleep (sleep)., Disp: , Rfl:    Multiple Vitamins-Minerals (MULTIVITAMIN ADULT) TABS, Take 1 tablet by mouth daily with lunch. , Disp: , Rfl:    NEEDLE, DISP, 22 G (EASY TOUCH HYPODERMIC NEEDLE) 22G X 1-1/2 MISC, Use as directed with testosterone ., Disp: 4 each, Rfl: 1   NEEDLE, DISP, 23 G (BD DISP NEEDLE) 23G X 1 MISC, Use as directed with testosterone ., Disp: 4 each, Rfl: 1   Omega-3 Fatty Acids (FISH OIL) 1000 MG CAPS, Take 1,000 mg by mouth daily., Disp: , Rfl:    sacubitril -valsartan  (ENTRESTO ) 97-103 MG, Take 1 tablet by mouth 2 (two) times daily., Disp: 60 tablet, Rfl: 11   spironolactone  (ALDACTONE ) 25 MG tablet, Take 0.5 tablets (12.5 mg total) by mouth daily., Disp: 90 tablet,  Rfl: 3   SYRINGE-NEEDLE, DISP, 3 ML (B-D 3CC LUER-LOK SYR 18GX1-1/2) 18G X 1-1/2 3 ML MISC, Use as directed with testosterone ., Disp: 4 each, Rfl: 1   testosterone  cypionate (DEPO-TESTOSTERONE ) 200 MG/ML injection, Inject 0.5 mL in the muscle once per week., Disp: 10 mL, Rfl: 1   testosterone  cypionate (DEPOTESTOSTERONE CYPIONATE) 200 MG/ML injection, Inject 0.5 mLs (100 mg total) into the muscle once a week. (Patient taking differently: Inject 100 mg into the muscle once a week. Wednesdays), Disp: 10 mL, Rfl: 1   tirzepatide  (MOUNJARO ) 10 MG/0.5ML Pen, Inject 10 mg into the skin once a week., Disp: 6 mL, Rfl: 2   triamcinolone  ointment (KENALOG ) 0.1 %, Apply 1 application topically 2 (two) times daily as needed for rash., Disp: , Rfl:   Observations/Objective: Patient is well-developed, well-nourished in no acute distress.  Resting comfortably  at home.  Head is normocephalic, atraumatic.  No labored breathing.  Speech is clear and coherent with logical content.  Patient is alert and oriented at baseline.    Assessment and Plan: 1. Viral illness (Primary)  Had patient take home COVID test which was negative. Potential flu-like illness versus other viral infection. Outside of time range for flu antiviral. Supportive measures and OTC medications reviewed. Increase hydration. Tylenol  for fever. Monitor symptoms and HR -- if not coming back down with his medicine, hydration and rest today, he is to seek an in-person evaluation ASAP. ER precautions reviewed.  Follow Up Instructions: I discussed the assessment and treatment plan with the patient. The patient was provided an opportunity to ask questions and all were answered. The patient agreed with the plan and demonstrated an understanding of the instructions.  A copy of instructions were sent to the patient via MyChart unless otherwise noted below.   The patient was advised to call back or seek an in-person evaluation if the symptoms worsen or  if the condition fails to improve as anticipated.    Michael Velma Lunger, PA-C

## 2024-01-31 NOTE — Patient Instructions (Signed)
 Koren DELENA Cory, thank you for joining [name], PA-C for today's virtual visit.  While this provider is not your primary care provider (PCP), if your PCP is located in our provider database this encounter information will be shared with them immediately following your visit.   A [facility] MyChart account gives you access to today's visit and all your visits, tests, and labs performed at [facility]  click here if you don't have a [facility] MyChart account or go to [email or web address]  Consent: (Patient) Koren DELENA Chenault provided verbal consent for this virtual visit at the beginning of the encounter.  Current Medications:  Current Outpatient Medications:    aspirin  81 MG tablet, Take 81 mg by mouth daily., Disp: , Rfl:    atorvastatin  (LIPITOR) 40 MG tablet, Take 1 tablet (40 mg total) by mouth daily., Disp: 90 tablet, Rfl: 1   Blood Glucose Monitoring Suppl (FREESTYLE LITE) w/Device KIT, Use daily to check blood sugar, Disp: 1 kit, Rfl: 0   carvedilol  (COREG ) 25 MG tablet, Take 1 tablet (25 mg total) by mouth 2 (two) times daily with a meal. NEEDS FOLLOW UP APPOINTMENT FOR MORE REFILLS, Disp: 180 tablet, Rfl: 0   dapagliflozin  propanediol (FARXIGA ) 10 MG TABS tablet, Take 1 tablet (10 mg total) by mouth daily., Disp: 90 tablet, Rfl: 2   fluticasone  (FLONASE ) 50 MCG/ACT nasal spray, Place 2 sprays into both nostrils daily. (Patient taking differently: Place 2 sprays into both nostrils daily as needed.), Disp: 16 g, Rfl: 6   glucose blood (FREESTYLE LITE) test strip, Use daily to check blood sugar, Disp: 100 each, Rfl: 3   ipratropium (ATROVENT ) 0.03 % nasal spray, Place 2 sprays into both nostrils every 12 (twelve) hours. (Patient taking differently: Place 2 sprays into both nostrils every 12 (twelve) hours. As needed), Disp: 30 mL, Rfl: 12   Lactobacillus (PROBIOTIC ACIDOPHILUS PO), Take 1 tablet by mouth daily in the afternoon., Disp: , Rfl:    Lancets  (FREESTYLE) lancets, Use to check blood sugars daily, Disp: 100 each, Rfl: 3   Melatonin 10 MG CAPS, Take 20 mg by mouth at bedtime as needed for sleep (sleep)., Disp: , Rfl:    Multiple Vitamins-Minerals (MULTIVITAMIN ADULT) TABS, Take 1 tablet by mouth daily with lunch. , Disp: , Rfl:    NEEDLE, DISP, 22 G (EASY TOUCH HYPODERMIC NEEDLE) 22G X 1-1/2 MISC, Use as directed with testosterone ., Disp: 4 each, Rfl: 1   NEEDLE, DISP, 23 G (BD DISP NEEDLE) 23G X 1 MISC, Use as directed with testosterone ., Disp: 4 each, Rfl: 1   Omega-3 Fatty Acids (FISH OIL) 1000 MG CAPS, Take 1,000 mg by mouth daily., Disp: , Rfl:    sacubitril -valsartan  (ENTRESTO ) 97-103 MG, Take 1 tablet by mouth 2 (two) times daily., Disp: 60 tablet, Rfl: 11   spironolactone  (ALDACTONE ) 25 MG tablet, Take 0.5 tablets (12.5 mg total) by mouth daily., Disp: 90 tablet, Rfl: 3   SYRINGE-NEEDLE, DISP, 3 ML (B-D 3CC LUER-LOK SYR 18GX1-1/2) 18G X 1-1/2 3 ML MISC, Use as directed with testosterone ., Disp: 4 each, Rfl: 1   testosterone  cypionate (DEPO-TESTOSTERONE ) 200 MG/ML injection, Inject 0.5 mL in the muscle once per week., Disp: 10 mL, Rfl: 1   testosterone  cypionate (DEPOTESTOSTERONE CYPIONATE) 200 MG/ML injection, Inject 0.5 mLs (100 mg total) into the muscle once a week. (Patient taking differently: Inject 100 mg into the muscle once a week. Wednesdays), Disp: 10 mL, Rfl: 1   tirzepatide  (MOUNJARO ) 10 MG/0.5ML Pen, Inject  10 mg into the skin once a week., Disp: 6 mL, Rfl: 2   triamcinolone  ointment (KENALOG ) 0.1 %, Apply 1 application topically 2 (two) times daily as needed for rash., Disp: , Rfl:    Medications ordered in this encounter:  No orders of the defined types were placed in this encounter.    *If you need refills on other medications prior to your next appointment, please contact your pharmacy*  Follow-Up: Call back or seek an in-person evaluation if the symptoms worsen or if the condition fails to improve as  anticipated.  [facility] [phone]  Other Instructions Please try to rest and stay hydrated. Tylenol  if needed today for fever or headache. If HR not coming down or if it gets 130 or above, I want you to be evaluated in person ASAP.  If you start to note any congestion or cough, ok to start a saline nasal rinse along with OTC Coricidin HBP. Put a humidifier in the bedroom and run at night.   Any other new symptoms, please message me ASAP so we can re-evaluate.      If you have been instructed to have an in-person evaluation today at a local Urgent Care facility, please use the link below. It will take you to a list of all of our available [facility], including address, phone number and hours of operation. Please do not delay care.  [facility]  If you or a family member do not have a primary care provider, use the link below to schedule a visit and establish care. When you choose a [facility] primary care physician or advanced practice provider, you gain a long-term partner in health. Find a Primary Care Provider  Learn more about [facility] in-office and virtual care options: [facility] Get Care Now

## 2024-02-01 ENCOUNTER — Ambulatory Visit
Admission: EM | Admit: 2024-02-01 | Discharge: 2024-02-01 | Disposition: A | Discharge status: Home / Self Care | Attending: Family Medicine | Admitting: Family Medicine

## 2024-02-01 ENCOUNTER — Other Ambulatory Visit (HOSPITAL_BASED_OUTPATIENT_CLINIC_OR_DEPARTMENT_OTHER): Payer: Self-pay

## 2024-02-01 ENCOUNTER — Other Ambulatory Visit: Payer: Self-pay

## 2024-02-01 DIAGNOSIS — B349 Viral infection, unspecified: Secondary | ICD-10-CM | POA: Insufficient documentation

## 2024-02-01 DIAGNOSIS — R509 Fever, unspecified: Secondary | ICD-10-CM | POA: Diagnosis not present

## 2024-02-01 DIAGNOSIS — R300 Dysuria: Secondary | ICD-10-CM | POA: Insufficient documentation

## 2024-02-01 LAB — GLUCOSE, POCT (MANUAL RESULT ENTRY): POC Glucose: 142 mg/dL — AB (ref 70–99)

## 2024-02-01 LAB — POC COVID19/FLU A&B COMBO
Covid Antigen, POC: NEGATIVE
Influenza A Antigen, POC: NEGATIVE
Influenza B Antigen, POC: NEGATIVE

## 2024-02-01 LAB — POCT URINE DIPSTICK
Bilirubin, UA: NEGATIVE
Glucose, UA: >=1000 mg/dL — AB
Leukocytes, UA: NEGATIVE
Nitrite, UA: NEGATIVE
POC PROTEIN,UA: 100 — AB
Spec Grav, UA: 1.015 (ref 1.010–1.025)
Urobilinogen, UA: 4 U/dL — AB
pH, UA: 6 (ref 5.0–8.0)

## 2024-02-01 MED ORDER — ACETAMINOPHEN 325 MG PO TABS
650.0000 mg | ORAL_TABLET | Freq: Once | ORAL | Status: AC
  Administered 2024-02-01: 650 mg via ORAL

## 2024-02-01 MED ORDER — PROMETHAZINE-DM 6.25-15 MG/5ML PO SYRP
5.0000 mL | ORAL_SOLUTION | Freq: Four times a day (QID) | ORAL | 0 refills | Status: AC | PRN
  Filled 2024-02-01: qty 118, 6d supply, fill #0

## 2024-02-01 NOTE — ED Triage Notes (Signed)
 Pt c/o chillsx4d. Pt states on Monday home COVID test neg. Pt was tachycardic on Monday up 128

## 2024-02-01 NOTE — Discharge Instructions (Addendum)
 You tested negative for COVID and flu.  You may take Promethazine DM as needed for your cough.  Please note this medication will make you drowsy.  Do not drink alcohol or drive while on this medication.  Continue Tylenol  ibuprofen as needed for fever management.  Lots of rest and fluids.  Please follow-up with your PCP if your symptoms are not improving.  Please go to the ER for any worsening symptoms.  Hope you feel better soon!

## 2024-02-01 NOTE — ED Provider Notes (Addendum)
 [facility]    CSN: [id number] Arrival date & time: 02/01/24  1147      History   Chief Complaint No chief complaint on file.   HPI Michael Guerra is a 63 y.o. male  presents for evaluation of URI symptoms for 4 days. Patient reports associated symptoms of fever, cough and congestion, chills. Denies N/V/D, sore throat, ear pain, shortness of breath. Patient does not have a hx of asthma. Patient is his not an active smoker.   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  Pt has taken cold medicine OTC for symptoms.  He also states over the past couple weeks has had intermittent dysuria with a weaker urine stream.  Denies any hematuria, urgency, frequency, difficulty stopping or starting the urine stream, testicular pain or swelling or penile discharge, or STD concern.  No history of BPH.  pt has no other concerns at this time.   HPI  Past Medical History:  Diagnosis Date   CAD (coronary artery disease) 06/08/2019   Cardiomyopathy (HCC) 05/23/2019   Cataract    Chest discomfort 05/12/2019   CHF (congestive heart failure) (HCC)    Chronic kidney disease    one kidney that is healthy   Chronic left shoulder pain 02/05/2020   Diabetes mellitus due to underlying condition with unspecified complications (HCC) 05/12/2019   Essential hypertension 05/12/2019   Foot sprain, left, initial encounter 06/22/2019   Hyperlipidemia 02/17/2006   Qualifier: Diagnosis of  By: Neysa MD, Venetia     LIVER FUNCTION TESTS, ABNORMAL 05/21/2006   Qualifier: Diagnosis of  By: Neysa MD, Venetia     Mixed dyslipidemia 05/12/2019   Neck pain 08/17/2023   Other specified abnormal findings of blood chemistry 05/20/2011   Formatting of this note might be different from the original. Elevated liver function tests 10/1 IMO update   Rectal polyp 05/20/2011   Formatting of this note might be different from the original. Noted march 2011 fu in march 2016   Renal insufficiency 05/23/2019    Skin lesion of chest wall 05/20/2011   Formatting of this note might be different from the original. Mid chest (prob seb/epidermoid cyst)   Solitary kidney    Type 2 diabetes mellitus with hyperglycemia, without long-term current use of insulin (HCC) 02/17/2006   Qualifier: Diagnosis of  By: Neysa MD, Venetia      Patient Active Problem List   Diagnosis Date Noted   Neck pain 08/17/2023   CHF (congestive heart failure) (HCC)    Cataract    Chronic kidney disease    Chronic left shoulder pain 02/05/2020   Foot sprain, left, initial encounter 06/22/2019   CAD (coronary artery disease) 06/08/2019   Cardiomyopathy (HCC) 05/23/2019   Solitary kidney 05/23/2019   Renal insufficiency 05/23/2019   Essential hypertension 05/12/2019   Mixed dyslipidemia 05/12/2019   Diabetes mellitus due to underlying condition with unspecified complications (HCC) 05/12/2019   Chest discomfort 05/12/2019   Rectal polyp 05/20/2011   Skin lesion of chest wall 05/20/2011   Other specified abnormal findings of blood chemistry 05/20/2011   LIVER FUNCTION TESTS, ABNORMAL 05/21/2006   Type 2 diabetes mellitus with hyperglycemia, without long-term current use of insulin (HCC) 02/17/2006   Hyperlipidemia 02/17/2006    Past Surgical History:  Procedure Laterality Date   ABDOMINAL SURGERY     KIDNEY SURGERY     bowel obstruction  x 3   LEFT HEART CATH AND CORONARY ANGIOGRAPHY N/A 05/31/2019   Procedure:  LEFT HEART CATH AND CORONARY ANGIOGRAPHY;  Surgeon: Darron Deatrice LABOR, MD;  Location: [facility];  Service: Cardiovascular;  Laterality: N/A;       Home Medications    Prior to Admission medications   Medication Sig Start Date End Date Taking? Authorizing Provider  promethazine-dextromethorphan (PROMETHAZINE-DM) 6.25-15 MG/5ML syrup Take 5 mLs by mouth 4 (four) times daily as needed for cough. 02/01/24  Yes Ryliegh Mcduffey, Jodi R, NP  aspirin  81 MG tablet Take 81 mg by mouth daily.    [provider]  atorvastatin  (LIPITOR) 40 MG tablet Take 1 tablet (40 mg total) by mouth daily. 07/30/23   Frann Mabel Mt, DO  Blood Glucose Monitoring Suppl (FREESTYLE LITE) w/Device KIT Use daily to check blood sugar 03/10/21   Wendling, Mabel Mt, DO  carvedilol  (COREG ) 25 MG tablet Take 1 tablet (25 mg total) by mouth 2 (two) times daily with a meal. NEEDS FOLLOW UP APPOINTMENT FOR MORE REFILLS 10/21/23   Rolan Ezra RAMAN, MD  dapagliflozin  propanediol (FARXIGA ) 10 MG TABS tablet Take 1 tablet (10 mg total) by mouth daily. 12/03/23   Frann Mabel Mt, DO  fluticasone  (FLONASE ) 50 MCG/ACT nasal spray Place 2 sprays into both nostrils daily. Patient taking differently: Place 2 sprays into both nostrils daily as needed. 06/04/23   Frann Mabel Mt, DO  glucose blood (FREESTYLE LITE) test strip Use daily to check blood sugar 03/10/21   Wendling, Mabel Mt, DO  ipratropium (ATROVENT ) 0.03 % nasal spray Place 2 sprays into both nostrils every 12 (twelve) hours. Patient taking differently: Place 2 sprays into both nostrils every 12 (twelve) hours. As needed 09/02/22   Kennyth Domino, FNP  Lactobacillus (PROBIOTIC ACIDOPHILUS PO) Take 1 tablet by mouth daily in the afternoon.    [provider]  Lancets (FREESTYLE) lancets Use to check blood sugars daily 03/10/21   Wendling, Mabel Mt, DO  Melatonin 10 MG CAPS Take 20 mg by mouth at bedtime as needed for sleep (sleep).    [provider]  Multiple Vitamins-Minerals (MULTIVITAMIN ADULT) TABS Take 1 tablet by mouth daily with lunch.     [provider]  NEEDLE, DISP, 22 G (EASY TOUCH HYPODERMIC NEEDLE) 22G X 1-1/2 MISC Use as directed with testosterone . 12/31/23     NEEDLE, DISP, 23 G (BD DISP NEEDLE) 23G X 1 MISC Use as directed with testosterone . 07/02/23   Wendling, Mabel Mt, DO  Omega-3 Fatty Acids (FISH OIL) 1000 MG CAPS Take 1,000 mg by mouth daily.    [provider]  sacubitril -valsartan   (ENTRESTO ) 97-103 MG Take 1 tablet by mouth 2 (two) times daily. 03/01/23   Rolan Ezra RAMAN, MD  spironolactone  (ALDACTONE ) 25 MG tablet Take 0.5 tablets (12.5 mg total) by mouth daily. 09/15/23   Revankar, Jennifer SAUNDERS, MD  SYRINGE-NEEDLE, DISP, 3 ML (B-D 3CC LUER-LOK SYR 18GX1-1/2) 18G X 1-1/2 3 ML MISC Use as directed with testosterone . 11/24/23     testosterone  cypionate (DEPO-TESTOSTERONE ) 200 MG/ML injection Inject 0.5 mL in the muscle once per week. 09/23/23     testosterone  cypionate (DEPOTESTOSTERONE CYPIONATE) 200 MG/ML injection Inject 0.5 mLs (100 mg total) into the muscle once a week. Patient taking differently: Inject 100 mg into the muscle once a week. Wednesdays 10/19/22   Cam Morene ORN, MD  tirzepatide  (MOUNJARO ) 10 MG/0.5ML Pen Inject 10 mg into the skin once a week. 12/21/23   Frann Mabel Mt, DO  triamcinolone  ointment (KENALOG ) 0.1 % Apply 1 application topically 2 (two) times  daily as needed for rash.    [provider]    Family History Family History  Problem Relation Age of Onset   Cancer Neg Hx    Colon cancer Neg Hx    Esophageal cancer Neg Hx    Rectal cancer Neg Hx    Stomach cancer Neg Hx     Social History Social History   Tobacco Use   Smoking status: Never   Smokeless tobacco: Never  Vaping Use   Vaping status: Never Used  Substance Use Topics   Alcohol use: No   Drug use: No     Allergies   Lisinopril   Review of Systems Review of Systems  Constitutional:  Positive for chills and fever.  HENT:  Positive for congestion.   Respiratory:  Positive for cough.   Genitourinary:  Positive for dysuria.     Physical Exam Triage Vital Signs ED Triage Vitals  Encounter Vitals Group     BP 02/01/24 1206 114/70     Girls Systolic BP Percentile --      Girls Diastolic BP Percentile --      Boys Systolic BP Percentile --      Boys Diastolic BP Percentile --      Pulse Rate 02/01/24 1206 (!) 102     Resp 02/01/24 1206 17     Temp  02/01/24 1206 (!) 101 F (38.3 C)     Temp Source 02/01/24 1206 Oral     SpO2 02/01/24 1206 95 %     Weight --      Height --      Head Circumference --      Peak Flow --      Pain Score 02/01/24 1204 0     Pain Loc --      Pain Education --      Exclude from Growth Chart --    No data found.  Updated Vital Signs BP 114/70   Pulse (!) 102   Temp 99.5 F (37.5 C)   Resp 17   SpO2 95%   Visual Acuity Right Eye Distance:   Left Eye Distance:   Bilateral Distance:    Right Eye Near:   Left Eye Near:    Bilateral Near:     Physical Exam Vitals and nursing note reviewed.  Constitutional:      General: He is not in acute distress.    Appearance: Normal appearance. He is not ill-appearing or toxic-appearing.  HENT:     Head: Normocephalic and atraumatic.     Right Ear: Tympanic membrane and ear canal normal.     Left Ear: Tympanic membrane and ear canal normal.     Nose: Congestion present.     Mouth/Throat:     Mouth: Mucous membranes are moist.     Pharynx: No oropharyngeal exudate or posterior oropharyngeal erythema.  Eyes:     Pupils: Pupils are equal, round, and reactive to light.  Cardiovascular:     Rate and Rhythm: Regular rhythm. Tachycardia present.     Heart sounds: Normal heart sounds.     Comments: Heart rate 102 in setting of fever Pulmonary:     Effort: Pulmonary effort is normal.     Breath sounds: Normal breath sounds. No wheezing or rhonchi.  Abdominal:     Tenderness: There is no right CVA tenderness or left CVA tenderness.  Musculoskeletal:     Cervical back: Normal range of motion and neck supple.  Lymphadenopathy:  Cervical: No cervical adenopathy.  Skin:    General: Skin is warm and dry.  Neurological:     General: No focal deficit present.     Mental Status: He is alert and oriented to person, place, and time.  Psychiatric:        Mood and Affect: Mood normal.        Behavior: Behavior normal.      UC Treatments / Results   Labs (all labs ordered are listed, but only abnormal results are displayed) Labs Reviewed  POCT URINE DIPSTICK - Abnormal; Notable for the following components:      Result Value   Glucose, UA >=1,000 (*)    Ketones, POC UA small (15) (*)    Blood, UA trace-lysed (*)    POC PROTEIN,UA =100 (*)    Urobilinogen, UA 4.0 (*)    All other components within normal limits  GLUCOSE, POCT (MANUAL RESULT ENTRY) - Abnormal; Notable for the following components:   POC Glucose 142 (*)    All other components within normal limits  URINE CULTURE  POC COVID19/FLU A&B COMBO    EKG   Radiology No results found.  Procedures Procedures (including critical care time)  Medications Ordered in UC Medications  acetaminophen  (TYLENOL ) tablet 650 mg (650 mg Oral Given 02/01/24 1209)    Initial Impression / Assessment and Plan / UC Course  I have reviewed the triage vital signs and the nursing notes.  Pertinent labs & imaging results that were available during my care of the patient were reviewed by me and considered in my medical decision making (see chart for details).     Reviewed exam and symptoms with patient.  No red flags.  Negative COVID and flu testing.  Urine without nitrates or leukocytes trace blood, will send urine culture based on symptoms and contact if positive.  It did show greater than thousand glucose, patient does have a history of diabetes.  Fingerstick glucose was 142. Discussed viral illness and symptomatic treatment.  Promethazine DM as needed for cough, side effect profile reviewed.  Fever did improve after Tylenol .  Encouraged rest fluids and PCP follow-up if symptoms are not improving.  ER precautions reviewed. Final Clinical Impressions(s) / UC Diagnoses   Final diagnoses:  Fever, unspecified  Dysuria  Viral illness     Discharge Instructions      You tested negative for COVID and flu.  You may take Promethazine DM as needed for your cough.  Please note this  medication will make you drowsy.  Do not drink alcohol or drive while on this medication.  Continue Tylenol  ibuprofen as needed for fever management.  Lots of rest and fluids.  Please follow-up with your PCP if your symptoms are not improving.  Please go to the ER for any worsening symptoms.  Hope you feel better soon!     ED Prescriptions     Medication Sig Dispense Auth. Provider   promethazine-dextromethorphan (PROMETHAZINE-DM) 6.25-15 MG/5ML syrup Take 5 mLs by mouth 4 (four) times daily as needed for cough. 118 mL Derenda Giddings, Jodi R, NP      PDMP not reviewed this encounter.   Loreda Myla SAUNDERS, NP 02/01/24 1308    Loreda Myla SAUNDERS, NP 02/01/24 1309

## 2024-02-02 ENCOUNTER — Other Ambulatory Visit (HOSPITAL_BASED_OUTPATIENT_CLINIC_OR_DEPARTMENT_OTHER): Payer: Self-pay

## 2024-02-02 ENCOUNTER — Ambulatory Visit: Admitting: Behavioral Health

## 2024-02-04 ENCOUNTER — Encounter: Payer: Self-pay | Admitting: Family Medicine

## 2024-02-04 ENCOUNTER — Ambulatory Visit: Payer: Self-pay | Admitting: *Deleted

## 2024-02-04 ENCOUNTER — Other Ambulatory Visit (HOSPITAL_BASED_OUTPATIENT_CLINIC_OR_DEPARTMENT_OTHER): Payer: Self-pay

## 2024-02-04 ENCOUNTER — Ambulatory Visit (HOSPITAL_COMMUNITY): Payer: Self-pay

## 2024-02-04 ENCOUNTER — Ambulatory Visit (INDEPENDENT_AMBULATORY_CARE_PROVIDER_SITE_OTHER): Admitting: Family Medicine

## 2024-02-04 VITALS — BP 118/60 | HR 92 | Temp 98.4°F | Resp 18 | Ht 73.0 in | Wt 191.6 lb

## 2024-02-04 DIAGNOSIS — N390 Urinary tract infection, site not specified: Secondary | ICD-10-CM | POA: Diagnosis not present

## 2024-02-04 DIAGNOSIS — R300 Dysuria: Secondary | ICD-10-CM

## 2024-02-04 LAB — POC URINALSYSI DIPSTICK (AUTOMATED)
Bilirubin, UA: NEGATIVE
Blood, UA: NEGATIVE
Glucose, UA: POSITIVE — AB
Leukocytes, UA: NEGATIVE
Nitrite, UA: NEGATIVE
Protein, UA: POSITIVE — AB
Spec Grav, UA: <=1.005 — AB (ref 1.010–1.025)
Urobilinogen, UA: 0.2 U/dL
pH, UA: 7.5 (ref 5.0–8.0)

## 2024-02-04 LAB — URINE CULTURE: Culture: >=100000 — AB

## 2024-02-04 MED ORDER — AMOXICILLIN-POT CLAVULANATE 875-125 MG PO TABS
1.0000 | ORAL_TABLET | Freq: Two times a day (BID) | ORAL | 0 refills | Status: AC
  Filled 2024-02-04: qty 14, 7d supply, fill #0

## 2024-02-04 MED ORDER — CEFTRIAXONE SODIUM 1 G IJ SOLR
1.0000 g | Freq: Once | INTRAMUSCULAR | Status: AC
  Administered 2024-02-04: 1 g via INTRAMUSCULAR

## 2024-02-04 NOTE — Progress Notes (Signed)
 Established Patient Office Visit  Subjective   Patient ID: Michael Guerra, male    DOB: 08-26-1960  Age: 63 y.o. MRN: [id number]  Chief Complaint  Patient presents with   UC follow up    Pt seen at [facility] on the 14th. Pt states feeling better but states having lower back pain and some burning and a weak stream     HPI Discussed the use of AI scribe software for clinical note transcription with the patient, who gave verbal consent to proceed.  History of Present Illness Michael Guerra is a 63 year old male with a urinary tract infection who presents with back pain and urinary symptoms.  He has been experiencing back pain localized to one side and has a history of having only one kidney. He initially thought the pain might be a muscle spasm from prolonged bed rest. There is no radiation of the pain.  He recently received results confirming a urinary tract infection from a urine culture, which also showed traces of blood, although he has not observed any blood in his urine recently. His urinary symptoms include dysuria and a sensation of slow urine flow, described as 'kind of clogged or something.' No frequent urination is noted, but there is discomfort when urinating. He has no history of nephrolithiasis and last saw his urologist earlier this year with a good PSA result.  He has been taking Farxiga  for the past two weeks, which may cause glucosuria. He recently completed a course of Mucinex for a cough, which has since improved.   Patient Active Problem List   Diagnosis Date Noted   Neck pain 08/17/2023   CHF (congestive heart failure) (HCC)    Cataract    Chronic kidney disease    Chronic left shoulder pain 02/05/2020   Foot sprain, left, initial encounter 06/22/2019   CAD (coronary artery disease) 06/08/2019   Cardiomyopathy (HCC) 05/23/2019   Solitary kidney 05/23/2019   Renal insufficiency  05/23/2019   Essential hypertension 05/12/2019   Mixed dyslipidemia 05/12/2019   Diabetes mellitus due to underlying condition with unspecified complications (HCC) 05/12/2019   Chest discomfort 05/12/2019   Rectal polyp 05/20/2011   Skin lesion of chest wall 05/20/2011   Other specified abnormal findings of blood chemistry 05/20/2011   LIVER FUNCTION TESTS, ABNORMAL 05/21/2006   Type 2 diabetes mellitus with hyperglycemia, without long-term current use of insulin (HCC) 02/17/2006   Hyperlipidemia 02/17/2006   Past Medical History:  Diagnosis Date   CAD (coronary artery disease) 06/08/2019   Cardiomyopathy (HCC) 05/23/2019   Cataract    Chest discomfort 05/12/2019   CHF (congestive heart failure) (HCC)    Chronic kidney disease    one kidney that is healthy   Chronic left shoulder pain 02/05/2020   Diabetes mellitus due to underlying condition with unspecified complications (HCC) 05/12/2019   Essential hypertension 05/12/2019   Foot sprain, left, initial encounter 06/22/2019   Hyperlipidemia 02/17/2006   Qualifier: Diagnosis of  By: Neysa MD, Venetia     LIVER FUNCTION TESTS, ABNORMAL 05/21/2006   Qualifier: Diagnosis of  By: Neysa MD, Venetia     Mixed dyslipidemia 05/12/2019   Neck pain 08/17/2023   Other specified abnormal findings of blood chemistry 05/20/2011   Formatting of this note might be different from the original. Elevated liver function tests 10/1 IMO update   Rectal polyp 05/20/2011   Formatting of this note might be different from the original. Noted march 2011 fu in march 2016  Renal insufficiency 05/23/2019   Skin lesion of chest wall 05/20/2011   Formatting of this note might be different from the original. Mid chest (prob seb/epidermoid cyst)   Solitary kidney    Type 2 diabetes mellitus with hyperglycemia, without long-term current use of insulin (HCC) 02/17/2006   Qualifier: Diagnosis of  By: Neysa MD, Venetia     Past Surgical History:  Procedure  Laterality Date   ABDOMINAL SURGERY     KIDNEY SURGERY     bowel obstruction  x 3   LEFT HEART CATH AND CORONARY ANGIOGRAPHY N/A 05/31/2019   Procedure: LEFT HEART CATH AND CORONARY ANGIOGRAPHY;  Surgeon: Darron Deatrice LABOR, MD;  Location: [facility];  Service: Cardiovascular;  Laterality: N/A;   Social History   Tobacco Use   Smoking status: Never   Smokeless tobacco: Never  Vaping Use   Vaping status: Never Used  Substance Use Topics   Alcohol use: No   Drug use: No   Social History   Socioeconomic History   Marital status: Married    Spouse name: Not on file   Number of children: Not on file   Years of education: Not on file   Highest education level: Associate degree: academic program  Occupational History   Not on file  Tobacco Use   Smoking status: Never   Smokeless tobacco: Never  Vaping Use   Vaping status: Never Used  Substance and Sexual Activity   Alcohol use: No   Drug use: No   Sexual activity: Not on file  Other Topics Concern   Not on file  Social History Narrative   Not on file   Social Drivers of Health   Financial Resource Strain: Low Risk  (12/02/2023)   Overall Financial Resource Strain (CARDIA)    Difficulty of Paying Living Expenses: Not hard at all  Food Insecurity: No Food Insecurity (12/02/2023)   Hunger Vital Sign    Worried About Running Out of Food in the Last Year: Never true    Ran Out of Food in the Last Year: Never true  Transportation Needs: No Transportation Needs (12/02/2023)   PRAPARE - Administrator, Civil Service (Medical): No    Lack of Transportation (Non-Medical): No  Physical Activity: Insufficiently Active (12/02/2023)   Exercise Vital Sign    Days of Exercise per Week: 4 days    Minutes of Exercise per Session: 20 min  Stress: No Stress Concern Present (12/02/2023)   [employer] of Occupational Health - Occupational Stress Questionnaire    Feeling of Stress: Not at all  Social Connections:  Unknown (12/02/2023)   Social Connection and Isolation Panel    Frequency of Communication with Friends and Family: Patient declined    Frequency of Social Gatherings with Friends and Family: Patient declined    Attends Religious Services: Patient declined    Database administrator or Organizations: Patient declined    Attends Engineer, structural: Not on file    Marital Status: Married  Catering manager Violence: Not on file   Family Status  Relation Name Status   Mother  Deceased   Father  Deceased   Sister  Deceased   Brother  Alive   Other  Alive   Neg Hx  (Not Specified)  No partnership data on file   Family History  Problem Relation Age of Onset   Cancer Neg Hx    Colon cancer Neg Hx    Esophageal cancer Neg Hx  Rectal cancer Neg Hx    Stomach cancer Neg Hx    Allergies  Allergen Reactions   Lisinopril Cough      Review of Systems  Constitutional:  Negative for fever and malaise/fatigue.  HENT:  Negative for congestion.   Eyes:  Negative for blurred vision.  Respiratory:  Negative for shortness of breath.   Cardiovascular:  Negative for chest pain, palpitations and leg swelling.  Gastrointestinal:  Negative for abdominal pain, blood in stool and nausea.  Genitourinary:  Negative for dysuria and frequency.  Musculoskeletal:  Negative for falls.  Skin:  Negative for rash.  Neurological:  Negative for dizziness, loss of consciousness and headaches.  Endo/Heme/Allergies:  Negative for environmental allergies.  Psychiatric/Behavioral:  Negative for depression. The patient is not nervous/anxious.       Objective:     BP 118/60 (BP Location: Left Arm, Patient Position: Sitting, Cuff Size: Normal)   Pulse 92   Temp 98.4 F (36.9 C) (Oral)   Resp 18   Ht 6' 1 (1.854 m)   Wt 191 lb 9.6 oz (86.9 kg)   SpO2 97%   BMI 25.28 kg/m  BP Readings from Last 3 Encounters:  02/04/24 118/60  02/01/24 114/70  12/03/23 120/78   Wt Readings from Last 3  Encounters:  02/04/24 191 lb 9.6 oz (86.9 kg)  12/03/23 194 lb (88 kg)  08/18/23 194 lb (88 kg)   SpO2 Readings from Last 3 Encounters:  02/04/24 97%  02/01/24 95%  12/03/23 99%      Physical Exam Vitals and nursing note reviewed.  Constitutional:      General: He is not in acute distress.    Appearance: Normal appearance. He is well-developed.  HENT:     Head: Normocephalic and atraumatic.  Eyes:     General: No scleral icterus.       Right eye: No discharge.        Left eye: No discharge.  Cardiovascular:     Rate and Rhythm: Normal rate and regular rhythm.     Heart sounds: No murmur heard. Pulmonary:     Effort: Pulmonary effort is normal. No respiratory distress.     Breath sounds: Normal breath sounds.  Musculoskeletal:        General: Normal range of motion.     Cervical back: Normal range of motion and neck supple.     Right lower leg: No edema.     Left lower leg: No edema.  Skin:    General: Skin is warm and dry.  Neurological:     Mental Status: He is alert and oriented to person, place, and time.  Psychiatric:        Mood and Affect: Mood normal.        Behavior: Behavior normal.        Thought Content: Thought content normal.        Judgment: Judgment normal.      Results for orders placed or performed in visit on 02/04/24  POCT Urinalysis Dipstick (Automated)  Result Value Ref Range   Color, UA Amber    Clarity, UA clear    Glucose, UA Positive (A) Negative   Bilirubin, UA negative    Ketones, UA trace    Spec Grav, UA <=1.005 (A) 1.010 - 1.025   Blood, UA negative    pH, UA 7.5 5.0 - 8.0   Protein, UA Positive (A) Negative   Urobilinogen, UA 0.2 0.2 or 1.0 E.U./dL   Nitrite, UA  negative    Leukocytes, UA Negative Negative    Last CBC Lab Results  Component Value Date   WBC 2.8 (L) 06/04/2023   HGB 16.8 06/04/2023   HCT 51.0 06/04/2023   MCV 91.9 06/04/2023   MCH 30.6 03/11/2022   RDW 13.4 06/04/2023   PLT 202.0 06/04/2023    Last metabolic panel Lab Results  Component Value Date   GLUCOSE 80 12/03/2023   NA 137 12/03/2023   K 4.8 12/03/2023   CL 101 12/03/2023   CO2 31 12/03/2023   BUN 10 12/03/2023   CREATININE 1.43 12/03/2023   GFR 51.40 (L) 12/03/2023   CALCIUM  9.7 12/03/2023   PROT 7.2 12/03/2023   ALBUMIN 4.5 12/03/2023   BILITOT 3.3 (H) 12/03/2023   ALKPHOS 59 12/03/2023   AST 40 (H) 12/03/2023   ALT 27 12/03/2023   ANIONGAP 11 07/29/2021   Last lipids Lab Results  Component Value Date   CHOL 89 12/03/2023   HDL 41.20 12/03/2023   LDLCALC 33 12/03/2023   LDLDIRECT 97.0 10/18/2017   TRIG 74.0 12/03/2023   CHOLHDL 2 12/03/2023   Last hemoglobin A1c Lab Results  Component Value Date   HGBA1C 6.4 12/03/2023   Last thyroid functions Lab Results  Component Value Date   TSH 2.150 10/18/2019   Last vitamin D No results found for: 25OHVITD2, 25OHVITD3, VD25OH Last vitamin B12 and Folate No results found for: VITAMINB12, FOLATE    The ASCVD Risk score (Arnett DK, et al., 2019) failed to calculate for the following reasons:   The valid total cholesterol range is 130 to 320 mg/dL    Assessment & Plan:   Problem List Items Addressed This Visit   None Visit Diagnoses       Dysuria    -  Primary   Relevant Orders   POCT Urinalysis Dipstick (Automated) (Completed)   Urine Culture     Urinary tract infection without hematuria, site unspecified       Relevant Medications   cefTRIAXone  (ROCEPHIN ) injection 1 g (Completed)     Assessment and Plan Assessment & Plan Urinary tract infection   Confirmed UTI based on urine culture with symptoms of back pain, dysuria, and decreased urine flow. No current hematuria, but traces noted in culture. Differential includes possible prostate issues, but recent PSA was normal. No history of nephrolithiasis. Single kidney increases concern for UTI management. Administer intramuscular Rocephin  for rapid symptom relief. Take  Augmentin   from Urgent Care to start the following day and complete the full course. Advise increased water intake. Instruct follow-up with Dr. Frann in 10-14 days for urine recheck and infection resolution. Advise seeking emergency care if symptoms worsen over the weekend.    Return in about 2 weeks (around 02/18/2024), or if symptoms worsen or fail to improve, for recheck ua==== ov with wendling.    Lafe Clerk R Lowne Chase, DO

## 2024-02-04 NOTE — Telephone Encounter (Signed)
 Patient was seen at [facility]- still have urinary pain, decreased stream and back pain. Per chart- UC was sent and has returned + results- awaiting provider review. Patient is aware he should here back from that- but due to increased symptoms, patient has been scheduled at office for f/u. FYI Only or Action Required?: FYI only for provider.  Patient was last seen in primary care on 01/31/2024 by Gladis Elsie BROCKS, PA-C.  Called Nurse Triage reporting Back Pain.  Symptoms began several weeks ago.  Interventions attempted: Other: Patient was seen at [facility].  Symptoms are: gradually worsening.  Triage Disposition: See HCP Within 4 Hours (Or PCP Triage)  Patient/caregiver understands and will follow disposition?: Yes   Reason for Disposition  Side (flank) or lower back pain present  Answer Assessment - Initial Assessment Questions 1. SYMPTOM: What's the main symptom you're concerned about? (e.g., frequency, incontinence)     Urinary pain 2. ONSET: When did the  pain  start?     1-2 week 3. PAIN: Is there any pain? If Yes, ask: How bad is it? (Scale: 1-10; mild, moderate, severe)     2-3/10, back pain- 7-8/10 4. CAUSE: What do you think is causing the symptoms?     Possible UTI 5. OTHER SYMPTOMS: Do you have any other symptoms? (e.g., blood in urine, fever, flank pain, pain with urination)     Weak stream  Protocols used: Urinary Symptoms-A-AH   Copied from CRM #[id number]. Topic: Clinical - Red Word Triage >> Feb 04, 2024 10:40 AM Alfonso HERO wrote: Red Word that prompted transfer to Nurse Triage: patient went to urgent care on Tuesday they said he had an infection also had blood in his urine. He didn't have back pain then but he has severe back pain now and doesn't know who to call or what to do.

## 2024-02-04 NOTE — Patient Instructions (Signed)
 Urinary Tract Infection, Male A urinary tract infection (UTI) is an infection in your urinary tract. The urinary tract is made up of the organs that make, store, and get rid of pee (urine) in your body. These organs include: The kidneys. The ureters. The bladder. The urethra. What are the causes? Most UTIs are caused by germs called bacteria. They may be in or near your genitals. These germs grow and cause swelling in your urinary tract. What increases the risk? You're more likely to get a UTI if: You have a soft tube called a catheter that drains your pee. You can't control when you pee or poop. You have trouble peeing because of: A prostate that's bigger than normal. A kidney stone. A urinary blockage. A nerve condition that affects your bladder. Not getting enough to drink. You're sexually active. You're an older adult. You're also more likely to get a UTI if you have other health problems, such as: Diabetes. A weak immune system. Your immune system is your body's defense system. Sickle cell disease. Injury of the spine. What are the signs or symptoms? Symptoms may include: Needing to pee right away. Peeing small amounts often. Trouble getting the stream started. Pain or burning when you pee. Blood in your pee. Pee that smells bad or odd. Pain in your belly or lower back. You may also: Feel confused. This may be the first symptom in older adults. Vomit. Not feel hungry. Feel tired or easily annoyed. Have a fever or chills. How is this diagnosed? A UTI is diagnosed based on your medical history and an exam. You may also have other tests. These may include: Pee tests. Blood tests. Tests for sexually transmitted infections (STIs). If you've had more than one UTI, you may need to have imaging studies done to find out why you keep getting them. How is this treated? A UTI can be treated by: Taking antibiotics or other medicines. Drinking enough fluid to keep your pee  pale yellow. In rare cases, a UTI can cause a very bad condition called sepsis. Sepsis may be treated in the hospital. Follow these instructions at home: Medicines Take your medicines only as told by your health care provider. If you were given antibiotics, take them as told by your provider. Do not stop taking them even if you start to feel better. General instructions Make sure you: Pee often and fully. Do not hold your pee for a long time. Pee after you have sex. Contact a health care provider if: Your symptoms don't get better after 1-2 days of taking antibiotics. Your symptoms go away and then come back. You have a fever or chills. You vomit or feel like you may vomit. Get help right away if: You have very bad pain in your back or lower belly. You faint. This information is not intended to replace advice given to you by your health care provider. Make sure you discuss any questions you have with your health care provider. Document Revised: 03/17/2023 Document Reviewed: 07/10/2022 Elsevier Patient Education  2025 [employer].

## 2024-02-07 ENCOUNTER — Ambulatory Visit: Payer: Self-pay | Admitting: Family Medicine

## 2024-02-07 ENCOUNTER — Other Ambulatory Visit (HOSPITAL_BASED_OUTPATIENT_CLINIC_OR_DEPARTMENT_OTHER): Payer: Self-pay

## 2024-02-07 ENCOUNTER — Other Ambulatory Visit: Payer: Self-pay

## 2024-02-07 LAB — URINE CULTURE
MICRO NUMBER:: 17114432
SPECIMEN QUALITY:: ADEQUATE

## 2024-02-09 ENCOUNTER — Other Ambulatory Visit (HOSPITAL_BASED_OUTPATIENT_CLINIC_OR_DEPARTMENT_OTHER): Payer: Self-pay

## 2024-02-09 MED ORDER — SYRINGE/NEEDLE (DISP) 18G X 1-1/2" 3 ML MISC
0 refills | Status: AC
  Filled 2024-02-09: qty 4, 28d supply, fill #0
  Filled 2024-03-10 (×2): qty 4, 28d supply, fill #1
  Filled 2024-04-12: qty 4, 28d supply, fill #2

## 2024-02-11 ENCOUNTER — Other Ambulatory Visit (HOSPITAL_BASED_OUTPATIENT_CLINIC_OR_DEPARTMENT_OTHER): Payer: Self-pay

## 2024-02-11 ENCOUNTER — Other Ambulatory Visit: Payer: Self-pay

## 2024-02-11 ENCOUNTER — Other Ambulatory Visit (HOSPITAL_COMMUNITY): Payer: Self-pay | Admitting: Cardiology

## 2024-02-11 MED ORDER — CARVEDILOL 25 MG PO TABS
25.0000 mg | ORAL_TABLET | Freq: Two times a day (BID) | ORAL | 0 refills | Status: AC
  Filled 2024-02-11: qty 180, 90d supply, fill #0

## 2024-02-28 ENCOUNTER — Other Ambulatory Visit (HOSPITAL_BASED_OUTPATIENT_CLINIC_OR_DEPARTMENT_OTHER): Payer: Self-pay

## 2024-02-28 ENCOUNTER — Other Ambulatory Visit: Payer: Self-pay | Admitting: Family Medicine

## 2024-02-28 DIAGNOSIS — E785 Hyperlipidemia, unspecified: Secondary | ICD-10-CM

## 2024-02-28 MED ORDER — ATORVASTATIN CALCIUM 40 MG PO TABS
40.0000 mg | ORAL_TABLET | Freq: Every day | ORAL | 1 refills | Status: AC
  Filled 2024-02-28: qty 90, 90d supply, fill #0

## 2024-03-10 ENCOUNTER — Other Ambulatory Visit: Payer: Self-pay

## 2024-03-10 ENCOUNTER — Other Ambulatory Visit (HOSPITAL_BASED_OUTPATIENT_CLINIC_OR_DEPARTMENT_OTHER): Payer: Self-pay

## 2024-03-10 ENCOUNTER — Other Ambulatory Visit (HOSPITAL_COMMUNITY): Payer: Self-pay | Admitting: Cardiology

## 2024-03-10 MED ORDER — SACUBITRIL-VALSARTAN 97-103 MG PO TABS
1.0000 | ORAL_TABLET | Freq: Two times a day (BID) | ORAL | 11 refills | Status: AC
  Filled 2024-03-10: qty 60, 30d supply, fill #0
  Filled 2024-04-24: qty 60, 30d supply, fill #1

## 2024-03-13 ENCOUNTER — Other Ambulatory Visit (HOSPITAL_BASED_OUTPATIENT_CLINIC_OR_DEPARTMENT_OTHER): Payer: Self-pay

## 2024-03-13 ENCOUNTER — Other Ambulatory Visit: Payer: Self-pay

## 2024-03-14 ENCOUNTER — Other Ambulatory Visit (HOSPITAL_BASED_OUTPATIENT_CLINIC_OR_DEPARTMENT_OTHER): Payer: Self-pay

## 2024-03-14 ENCOUNTER — Other Ambulatory Visit: Payer: Self-pay

## 2024-03-14 DIAGNOSIS — Z7189 Other specified counseling: Secondary | ICD-10-CM | POA: Diagnosis not present

## 2024-04-12 ENCOUNTER — Other Ambulatory Visit (HOSPITAL_COMMUNITY): Payer: Self-pay

## 2024-04-12 ENCOUNTER — Other Ambulatory Visit: Payer: Self-pay | Admitting: Family Medicine

## 2024-04-14 ENCOUNTER — Other Ambulatory Visit (HOSPITAL_COMMUNITY): Payer: Self-pay

## 2024-04-14 MED ORDER — "BD DISP NEEDLE 23G X 1"" MISC"
1.0000 | 1 refills | Status: AC
  Filled 2024-04-14: qty 4, 28d supply, fill #0

## 2024-04-17 DIAGNOSIS — N1831 Chronic kidney disease, stage 3a: Secondary | ICD-10-CM | POA: Diagnosis not present

## 2024-04-19 ENCOUNTER — Other Ambulatory Visit (HOSPITAL_BASED_OUTPATIENT_CLINIC_OR_DEPARTMENT_OTHER): Payer: Self-pay

## 2024-04-24 ENCOUNTER — Other Ambulatory Visit (HOSPITAL_BASED_OUTPATIENT_CLINIC_OR_DEPARTMENT_OTHER): Payer: Self-pay

## 2024-04-28 LAB — OPHTHALMOLOGY REPORT-SCANNED

## 2024-06-05 ENCOUNTER — Encounter: Admitting: Family Medicine
